# Patient Record
Sex: Female | Born: 1950 | Race: White | Hispanic: No | Marital: Married | Smoking: Current every day smoker
Health system: Southern US, Community
[De-identification: ages and names within clinical notes are randomized; demographics above are authoritative.]

## PROBLEM LIST (undated history)

## (undated) DIAGNOSIS — Z720 Tobacco use: Secondary | ICD-10-CM

## (undated) DIAGNOSIS — C3490 Malignant neoplasm of unspecified part of unspecified bronchus or lung: Secondary | ICD-10-CM

## (undated) DIAGNOSIS — M62838 Other muscle spasm: Secondary | ICD-10-CM

## (undated) HISTORY — PX: APPENDECTOMY: SHX54

## (undated) SURGERY — Surgical Case
Anesthesia: *Unknown

---

## 2003-11-24 ENCOUNTER — Other Ambulatory Visit: Payer: Self-pay

## 2004-01-12 ENCOUNTER — Emergency Department: Payer: Self-pay | Admitting: Internal Medicine

## 2004-12-21 ENCOUNTER — Emergency Department: Payer: Self-pay | Admitting: Emergency Medicine

## 2005-04-14 ENCOUNTER — Emergency Department: Payer: Self-pay | Admitting: Internal Medicine

## 2005-04-14 ENCOUNTER — Emergency Department: Payer: Self-pay | Admitting: Emergency Medicine

## 2005-04-26 ENCOUNTER — Emergency Department: Payer: Self-pay | Admitting: Emergency Medicine

## 2006-06-25 ENCOUNTER — Emergency Department: Payer: Self-pay | Admitting: Emergency Medicine

## 2006-09-06 ENCOUNTER — Other Ambulatory Visit: Payer: Self-pay

## 2006-09-06 ENCOUNTER — Inpatient Hospital Stay: Payer: Self-pay | Admitting: Internal Medicine

## 2006-12-04 ENCOUNTER — Emergency Department: Payer: Self-pay | Admitting: Emergency Medicine

## 2006-12-11 ENCOUNTER — Emergency Department: Payer: Self-pay | Admitting: Unknown Physician Specialty

## 2007-03-28 ENCOUNTER — Emergency Department: Payer: Self-pay | Admitting: Emergency Medicine

## 2007-04-26 ENCOUNTER — Emergency Department: Payer: Self-pay

## 2007-05-30 ENCOUNTER — Emergency Department: Payer: Self-pay | Admitting: Emergency Medicine

## 2007-08-14 ENCOUNTER — Emergency Department: Payer: Self-pay | Admitting: Emergency Medicine

## 2007-08-14 ENCOUNTER — Other Ambulatory Visit: Payer: Self-pay

## 2008-06-04 ENCOUNTER — Inpatient Hospital Stay: Payer: Self-pay | Admitting: Psychiatry

## 2008-07-21 ENCOUNTER — Emergency Department: Payer: Self-pay | Admitting: Emergency Medicine

## 2009-02-19 ENCOUNTER — Emergency Department: Payer: Self-pay | Admitting: Emergency Medicine

## 2009-04-26 ENCOUNTER — Emergency Department: Payer: Self-pay | Admitting: Emergency Medicine

## 2009-07-12 ENCOUNTER — Emergency Department: Payer: Self-pay | Admitting: Emergency Medicine

## 2009-10-27 ENCOUNTER — Emergency Department: Payer: Self-pay | Admitting: Emergency Medicine

## 2009-12-22 ENCOUNTER — Emergency Department: Payer: Self-pay | Admitting: Emergency Medicine

## 2010-01-18 ENCOUNTER — Emergency Department: Payer: Self-pay | Admitting: Emergency Medicine

## 2010-03-09 ENCOUNTER — Emergency Department: Payer: Self-pay | Admitting: Emergency Medicine

## 2010-03-25 ENCOUNTER — Emergency Department: Payer: Self-pay | Admitting: Emergency Medicine

## 2010-04-01 ENCOUNTER — Emergency Department: Payer: Self-pay | Admitting: Unknown Physician Specialty

## 2010-06-20 ENCOUNTER — Emergency Department: Payer: Self-pay | Admitting: Emergency Medicine

## 2010-07-31 ENCOUNTER — Emergency Department: Payer: Self-pay | Admitting: Emergency Medicine

## 2010-11-04 ENCOUNTER — Emergency Department: Payer: Self-pay | Admitting: Emergency Medicine

## 2011-01-28 ENCOUNTER — Inpatient Hospital Stay: Payer: Self-pay | Admitting: Psychiatry

## 2012-04-10 ENCOUNTER — Emergency Department: Payer: Self-pay | Admitting: Emergency Medicine

## 2012-04-10 LAB — URINALYSIS, COMPLETE
Bilirubin,UR: NEGATIVE
Glucose,UR: NEGATIVE mg/dL (ref 0–75)
Ketone: NEGATIVE
Nitrite: NEGATIVE
Specific Gravity: 1.009 (ref 1.003–1.030)
WBC UR: 2 /HPF (ref 0–5)

## 2012-04-10 LAB — BASIC METABOLIC PANEL
Creatinine: 0.89 mg/dL (ref 0.60–1.30)
EGFR (Non-African Amer.): >60
Osmolality: 275 (ref 275–301)

## 2012-04-10 LAB — CBC
HGB: 16 g/dL (ref 12.0–16.0)
MCH: 29.2 pg (ref 26.0–34.0)
MCV: 85 fL (ref 80–100)
Platelet: 222 10*3/uL (ref 150–440)
RBC: 5.47 10*6/uL — ABNORMAL HIGH (ref 3.80–5.20)
RDW: 14.8 % — ABNORMAL HIGH (ref 11.5–14.5)
WBC: 9 10*3/uL (ref 3.6–11.0)

## 2015-02-04 ENCOUNTER — Encounter: Payer: Self-pay | Admitting: Emergency Medicine

## 2015-02-04 ENCOUNTER — Emergency Department: Payer: Self-pay

## 2015-02-04 ENCOUNTER — Emergency Department
Admission: EM | Admit: 2015-02-04 | Discharge: 2015-02-04 | Disposition: A | Discharge status: Home / Self Care | Payer: Self-pay | Attending: Emergency Medicine | Admitting: Emergency Medicine

## 2015-02-04 DIAGNOSIS — R110 Nausea: Secondary | ICD-10-CM | POA: Insufficient documentation

## 2015-02-04 DIAGNOSIS — F17210 Nicotine dependence, cigarettes, uncomplicated: Secondary | ICD-10-CM | POA: Insufficient documentation

## 2015-02-04 DIAGNOSIS — M79602 Pain in left arm: Secondary | ICD-10-CM | POA: Insufficient documentation

## 2015-02-04 DIAGNOSIS — R0982 Postnasal drip: Secondary | ICD-10-CM | POA: Insufficient documentation

## 2015-02-04 DIAGNOSIS — N390 Urinary tract infection, site not specified: Secondary | ICD-10-CM | POA: Insufficient documentation

## 2015-02-04 DIAGNOSIS — M5412 Radiculopathy, cervical region: Secondary | ICD-10-CM | POA: Insufficient documentation

## 2015-02-04 LAB — CBC WITH DIFFERENTIAL/PLATELET
Basophils Absolute: 0.1 10*3/uL (ref 0–0.1)
Basophils Relative: 1 %
Eosinophils Absolute: 0.1 10*3/uL (ref 0–0.7)
Eosinophils Relative: 2 %
HCT: 40.7 % (ref 35.0–47.0)
HEMOGLOBIN: 13.4 g/dL (ref 12.0–16.0)
LYMPHS ABS: 1 10*3/uL (ref 1.0–3.6)
LYMPHS PCT: 15 %
MCH: 27 pg (ref 26.0–34.0)
MCHC: 33.1 g/dL (ref 32.0–36.0)
MCV: 81.7 fL (ref 80.0–100.0)
Monocytes Absolute: 0.4 10*3/uL (ref 0.2–0.9)
Monocytes Relative: 6 %
NEUTROS ABS: 5.6 10*3/uL (ref 1.4–6.5)
NEUTROS PCT: 78 %
Platelets: 157 10*3/uL (ref 150–440)
RBC: 4.97 MIL/uL (ref 3.80–5.20)
RDW: 16.1 % — ABNORMAL HIGH (ref 11.5–14.5)
WBC: 7.2 10*3/uL (ref 3.6–11.0)

## 2015-02-04 LAB — URINALYSIS COMPLETE WITH MICROSCOPIC (ARMC ONLY)
Bilirubin Urine: NEGATIVE
GLUCOSE, UA: NEGATIVE mg/dL
Hgb urine dipstick: NEGATIVE
Ketones, ur: NEGATIVE mg/dL
Nitrite: NEGATIVE
Protein, ur: NEGATIVE mg/dL
Specific Gravity, Urine: 1.01 (ref 1.005–1.030)
pH: 5 (ref 5.0–8.0)

## 2015-02-04 LAB — COMPREHENSIVE METABOLIC PANEL
ALK PHOS: 140 U/L — AB (ref 38–126)
ALT: 27 U/L (ref 14–54)
AST: 41 U/L (ref 15–41)
Albumin: 3.9 g/dL (ref 3.5–5.0)
Anion gap: 8 (ref 5–15)
BUN: 11 mg/dL (ref 6–20)
CALCIUM: 10.4 mg/dL — AB (ref 8.9–10.3)
CO2: 28 mmol/L (ref 22–32)
CREATININE: 0.87 mg/dL (ref 0.44–1.00)
Chloride: 107 mmol/L (ref 101–111)
GFR calc non Af Amer: >60 mL/min (ref >60)
GLUCOSE: 106 mg/dL — AB (ref 65–99)
Potassium: 3.7 mmol/L (ref 3.5–5.1)
SODIUM: 143 mmol/L (ref 135–145)
Total Bilirubin: 0.6 mg/dL (ref 0.3–1.2)
Total Protein: 7.2 g/dL (ref 6.5–8.1)

## 2015-02-04 MED ORDER — PREDNISONE 20 MG PO TABS
60.0000 mg | ORAL_TABLET | Freq: Once | ORAL | Status: AC
  Administered 2015-02-04: 60 mg via ORAL
  Filled 2015-02-04: qty 3

## 2015-02-04 MED ORDER — PREDNISONE 10 MG (21) PO TBPK: ORAL_TABLET | ORAL | Status: DC

## 2015-02-04 MED ORDER — HYDROCODONE-ACETAMINOPHEN 5-325 MG PO TABS
1.0000 | ORAL_TABLET | Freq: Once | ORAL | Status: AC
  Administered 2015-02-04: 1 via ORAL
  Filled 2015-02-04: qty 1

## 2015-02-04 MED ORDER — HYDROCODONE-ACETAMINOPHEN 5-325 MG PO TABS: 1.0000 | ORAL_TABLET | ORAL | Status: DC | PRN

## 2015-02-04 MED ORDER — CIPROFLOXACIN HCL 500 MG PO TABS: 500.0000 mg | ORAL_TABLET | Freq: Two times a day (BID) | ORAL | Status: AC

## 2015-02-04 MED ORDER — CIPROFLOXACIN HCL 500 MG PO TABS
500.0000 mg | ORAL_TABLET | Freq: Once | ORAL | Status: AC
  Administered 2015-02-04: 500 mg via ORAL
  Filled 2015-02-04: qty 1

## 2015-02-04 NOTE — ED Notes (Signed)
States she has had back pain for about 2 months  Now having some numbness to left arm for the past 2 weeks . Denies injury. Trying OTC ibuprofen with min to no relief

## 2015-02-04 NOTE — ED Notes (Signed)
C/o mid back pain that radiates down both legs x 2 months, states pain is getting worse, states she also has had some "nerve pain" to left forearm that she is had about 2 weeks, states she had a previous fx and surgery to left arm in the past

## 2015-02-04 NOTE — Discharge Instructions (Signed)
Cervical Radiculopathy Cervical radiculopathy happens when a nerve in the neck (cervical nerve) is pinched or bruised. This condition can develop because of an injury or as part of the normal aging process. Pressure on the cervical nerves can cause pain or numbness that runs from the neck all the way down into the arm and fingers. Usually, this condition gets better with rest. Treatment may be needed if the condition does not improve.  CAUSES This condition may be caused by:  Injury.  Slipped (herniated) disk.  Muscle tightness in the neck because of overuse.  Arthritis.  Breakdown or degeneration in the bones and joints of the spine (spondylosis) due to aging.  Bone spurs that may develop near the cervical nerves. SYMPTOMS Symptoms of this condition include:  Pain that runs from the neck to the arm and hand. The pain can be severe or irritating. It may be worse when the neck is moved.  Numbness or weakness in the affected arm and hand. DIAGNOSIS This condition may be diagnosed based on symptoms, medical history, and a physical exam. You may also have tests, including:  X-rays.  CT scan.  MRI.  Electromyogram (EMG).  Nerve conduction tests. TREATMENT In many cases, treatment is not needed for this condition. With rest, the condition usually gets better over time. If treatment is needed, options may include:  Wearing a soft neck collar for short periods of time.  Physical therapy to strengthen your neck muscles.  Medicines, such as NSAIDs, oral corticosteroids, or spinal injections.  Surgery. This may be needed if other treatments do not help. Various types of surgery may be done depending on the cause of your problems. HOME CARE INSTRUCTIONS Managing Pain  Take over-the-counter and prescription medicines only as told by your health care provider.  If directed, apply ice to the affected area.  Put ice in a plastic bag.  Place a towel between your skin and the  bag.  Leave the ice on for 20 minutes, 2-3 times per day.  If ice does not help, you can try using heat. Take a warm shower or warm bath, or use a heat pack as told by your health care provider.  Try a gentle neck and shoulder massage to help relieve symptoms. Activity  Rest as needed. Follow instructions from your health care provider about any restrictions on activities.  Do stretching and strengthening exercises as told by your health care provider or physical therapist. General Instructions  If you were given a soft collar, wear it as told by your health care provider.  Use a flat pillow when you sleep.  Keep all follow-up visits as told by your health care provider. This is important. SEEK MEDICAL CARE IF:  Your condition does not improve with treatment. SEEK IMMEDIATE MEDICAL CARE IF:  Your pain gets much worse and cannot be controlled with medicines.  You have weakness or numbness in your hand, arm, face, or leg.  You have a high fever.  You have a stiff, rigid neck.  You lose control of your bowels or your bladder (have incontinence).  You have trouble with walking, balance, or speaking.   This information is not intended to replace advice given to you by your health care provider. Make sure you discuss any questions you have with your health care provider.   Document Released: 11/29/2000 Document Revised: 11/25/2014 Document Reviewed: 04/30/2014 Elsevier Interactive Patient Education 2016 Elsevier Inc[employer]   Urinary Tract Infection Urinary tract infections (UTIs) can develop anywhere along your  urinary tract. Your urinary tract is your body's drainage system for removing wastes and extra water. Your urinary tract includes two kidneys, two ureters, a bladder, and a urethra. Your kidneys are a pair of bean-shaped organs. Each kidney is about the size of your fist. They are located below your ribs, one on each side of your spine. CAUSES Infections are caused by  microbes, which are microscopic organisms, including fungi, viruses, and bacteria. These organisms are so small that they can only be seen through a microscope. Bacteria are the microbes that most commonly cause UTIs. SYMPTOMS  Symptoms of UTIs may vary by age and gender of the patient and by the location of the infection. Symptoms in young women typically include a frequent and intense urge to urinate and a painful, burning feeling in the bladder or urethra during urination. Older women and men are more likely to be tired, shaky, and weak and have muscle aches and abdominal pain. A fever may mean the infection is in your kidneys. Other symptoms of a kidney infection include pain in your back or sides below the ribs, nausea, and vomiting. DIAGNOSIS To diagnose a UTI, your caregiver will ask you about your symptoms. Your caregiver will also ask you to provide a urine sample. The urine sample will be tested for bacteria and white blood cells. White blood cells are made by your body to help fight infection. TREATMENT  Typically, UTIs can be treated with medication. Because most UTIs are caused by a bacterial infection, they usually can be treated with the use of antibiotics. The choice of antibiotic and length of treatment depend on your symptoms and the type of bacteria causing your infection. HOME CARE INSTRUCTIONS  If you were prescribed antibiotics, take them exactly as your caregiver instructs you. Finish the medication even if you feel better after you have only taken some of the medication.  Drink enough water and fluids to keep your urine clear or pale yellow.  Avoid caffeine, tea, and carbonated beverages. They tend to irritate your bladder.  Empty your bladder often. Avoid holding urine for long periods of time.  Empty your bladder before and after sexual intercourse.  After a bowel movement, women should cleanse from front to back. Use each tissue only once. SEEK MEDICAL CARE IF:   You  have back pain.  You develop a fever.  Your symptoms do not begin to resolve within 3 days. SEEK IMMEDIATE MEDICAL CARE IF:   You have severe back pain or lower abdominal pain.  You develop chills.  You have nausea or vomiting.  You have continued burning or discomfort with urination. MAKE SURE YOU:   Understand these instructions.  Will watch your condition.  Will get help right away if you are not doing well or get worse.   This information is not intended to replace advice given to you by your health care provider. Make sure you discuss any questions you have with your health care provider.   Document Released: 12/14/2004 Document Revised: 11/25/2014 Document Reviewed: 04/14/2011 Elsevier Interactive Patient Education 2016 Elsevier Inc.   Take pain medicine and prednisone taper as directed. Follow-up with the orthopedist if not improving. Please establish with a primary care physician for further evaluation as well. Also take antibiotic as directed and return to emergency room for any concerns.

## 2015-02-04 NOTE — ED Provider Notes (Signed)
[facility] Emergency Department Provider Note  ____________________________________________  Time seen: Approximately 4:21 PM  I have reviewed the triage vital signs and the nursing notes.   HISTORY  Chief Complaint Back Pain    HPI Brandy Odom is a 64 y.o. female who presents to the emergency room with several concerns. She does not have a primary physician. She has developed left arm pain 2 weeks. She describes pins and needles sensation. She was seen in a walk-in clinic approximately 2 weeks ago for low back pain and received a lumbar x-ray. She also complains of nausea, urinary frequency and occasional dysuria. She has had a URI that is improving. No current fevers. Minimal sinus drainage. She received NSAIDs for her back pain.   History reviewed. No pertinent past medical history.  There are no active problems to display for this patient.   Past Surgical History  Procedure Laterality Date  . Appendectomy      Current Outpatient Rx  Name  Route  Sig  Dispense  Refill  . ciprofloxacin (CIPRO) 500 MG tablet   Oral   Take 1 tablet (500 mg total) by mouth 2 (two) times daily.   10 tablet   0   . HYDROcodone-acetaminophen (NORCO) 5-325 MG tablet   Oral   Take 1 tablet by mouth every 4 (four) hours as needed for moderate pain.   20 tablet   0   . predniSONE (STERAPRED UNI-PAK 21 TAB) 10 MG (21) TBPK tablet      6 tablets on day 1, 5 tablets on day 2, 4 tablets on day 3, etc...   21 tablet   0     Allergies Naproxen  No family history on file.  Social History Social History  Substance Use Topics  . Smoking status: Current Every Day Smoker    Types: Cigarettes  . Smokeless tobacco: None  . Alcohol Use: No    Review of Systems Constitutional: No fever/chills Eyes: No visual changes. ENT: No sore throat. Cardiovascular: Denies chest pain. Respiratory: Denies shortness of breath. Gastrointestinal: No abdominal pain.    Genitourinary: Negative for dysuria. Musculoskeletal: Per history of present illness Skin: Negative for rash. Neurological: Negative for headaches, focal weakness or numbness. 10-point ROS otherwise negative.  ____________________________________________   PHYSICAL EXAM:  VITAL SIGNS: ED Triage Vitals  Enc Vitals Group     BP 02/04/15 1545 144/88 mmHg     Pulse Rate 02/04/15 1545 102     Resp 02/04/15 1545 18     Temp 02/04/15 1545 98.2 F (36.8 C)     Temp Source 02/04/15 1545 Oral     SpO2 02/04/15 1545 96 %     Weight 02/04/15 1545 145 lb (65.772 kg)     Height 02/04/15 1545 5' 4"  (1.626 m)     Head Cir --      Peak Flow --      Pain Score 02/04/15 1546 10     Pain Loc --      Pain Edu? --      Excl. in [place]? --     Constitutional: Alert and oriented. Well appearing and in no acute distress. Eyes: Conjunctivae are normal. PERRL. EOMI. Ears:  Clear with normal landmarks. No erythema. Head: Atraumatic. Nose: No congestion/rhinnorhea. Mouth/Throat: Mucous membranes are moist.  Oropharynx non-erythematous. No lesions. Neck:  Supple.  No adenopathy.  Cervical tenderness, with left paracervical tenderness. Cardiovascular: Normal rate, regular rhythm. Grossly normal heart sounds.  Good peripheral circulation.  Respiratory: Normal respiratory effort.  No retractions. Lungs CTAB. Musculoskeletal: Nml ROM of upper and lower extremity joints. Negative supraspinatus test. Negative impingement sign Neurologic:  Normal speech and language. No gross focal neurologic deficits are appreciated. No gait instability. Skin:  Skin is warm, dry and intact. No rash noted. Psychiatric: Mood and affect are normal. Speech and behavior are normal.  ____________________________________________   LABS (all labs ordered are listed, but only abnormal results are displayed)  Labs Reviewed  URINALYSIS COMPLETEWITH MICROSCOPIC ([facility] ONLY) - Abnormal; Notable for the following:    Color, Urine  YELLOW (*)    APPearance HAZY (*)    Leukocytes, UA 1+ (*)    Bacteria, UA MANY (*)    Squamous Epithelial / LPF 6-30 (*)    All other components within normal limits  CBC WITH DIFFERENTIAL/PLATELET - Abnormal; Notable for the following:    RDW 16.1 (*)    All other components within normal limits  COMPREHENSIVE METABOLIC PANEL - Abnormal; Notable for the following:    Glucose, Bld 106 (*)    Calcium 10.4 (*)    Alkaline Phosphatase 140 (*)    All other components within normal limits   ____________________________________________  EKG   ____________________________________________  RADIOLOGY  CLINICAL DATA: Neck pain, left arm pain and numbness for about 2 weeks  EXAM: CERVICAL SPINE - 2-3 VIEW  COMPARISON: None.  FINDINGS: Four views of cervical spine submitted. No acute fracture or subluxation. Mild degenerative changes C1-C2 articulation. There is disc space flattening with mild anterior spurring at C5-C6 and C6-C7 level. No prevertebral soft tissue swelling. Cervical airway is patent.  IMPRESSION: No acute fracture or subluxation. Degenerative changes as described above.   Electronically Signed  By: Lahoma Crocker M.D.  On: 02/04/2015 16:54 ____________________________________________   PROCEDURES  Procedure(s) performed: None  Critical Care performed: No  ____________________________________________   INITIAL IMPRESSION / ASSESSMENT AND PLAN / ED COURSE  Pertinent labs & imaging results that were available during my care of the patient were reviewed by me and considered in my medical decision making (see chart for details).  64 year old who presents with left arm pain, pins and needle sensation. Treated for cervical radiculopathy with prednisone taper, and hydrocodone. Cervical spine films reveal degenerative disease. Encouraged follow-up with orthopedist and establishing with a primary care physician. CBC and met C are stable. Her urine  test came back positive for UTI. She is started on Cipro for 5 days. ____________________________________________   FINAL CLINICAL IMPRESSION(S) / ED DIAGNOSES  Final diagnoses:  Cervical radiculopathy  UTI (lower urinary tract infection)      Mortimer Fries, PA-C 02/04/15 [place], MD 02/05/15 0110

## 2015-02-09 ENCOUNTER — Emergency Department: Payer: Medicaid Other

## 2015-02-09 ENCOUNTER — Emergency Department
Admission: EM | Admit: 2015-02-09 | Discharge: 2015-02-09 | Disposition: A | Discharge status: Home / Self Care | Payer: Medicaid Other | Attending: Emergency Medicine | Admitting: Emergency Medicine

## 2015-02-09 DIAGNOSIS — M549 Dorsalgia, unspecified: Secondary | ICD-10-CM | POA: Diagnosis not present

## 2015-02-09 DIAGNOSIS — J159 Unspecified bacterial pneumonia: Secondary | ICD-10-CM | POA: Insufficient documentation

## 2015-02-09 DIAGNOSIS — J189 Pneumonia, unspecified organism: Secondary | ICD-10-CM

## 2015-02-09 DIAGNOSIS — F17210 Nicotine dependence, cigarettes, uncomplicated: Secondary | ICD-10-CM | POA: Insufficient documentation

## 2015-02-09 DIAGNOSIS — R079 Chest pain, unspecified: Secondary | ICD-10-CM | POA: Diagnosis present

## 2015-02-09 LAB — COMPREHENSIVE METABOLIC PANEL
ALT: 95 U/L — ABNORMAL HIGH (ref 14–54)
AST: 69 U/L — AB (ref 15–41)
Albumin: 4 g/dL (ref 3.5–5.0)
Alkaline Phosphatase: 136 U/L — ABNORMAL HIGH (ref 38–126)
Anion gap: 9 (ref 5–15)
BILIRUBIN TOTAL: 0.8 mg/dL (ref 0.3–1.2)
BUN: 19 mg/dL (ref 6–20)
CALCIUM: 10.1 mg/dL (ref 8.9–10.3)
CO2: 25 mmol/L (ref 22–32)
CREATININE: 0.92 mg/dL (ref 0.44–1.00)
Chloride: 107 mmol/L (ref 101–111)
GFR calc Af Amer: >60 mL/min (ref >60)
Glucose, Bld: 118 mg/dL — ABNORMAL HIGH (ref 65–99)
POTASSIUM: 3.3 mmol/L — AB (ref 3.5–5.1)
Sodium: 141 mmol/L (ref 135–145)
TOTAL PROTEIN: 7.1 g/dL (ref 6.5–8.1)

## 2015-02-09 LAB — CBC
HEMATOCRIT: 42.2 % (ref 35.0–47.0)
Hemoglobin: 14.4 g/dL (ref 12.0–16.0)
MCH: 27.6 pg (ref 26.0–34.0)
MCHC: 34.1 g/dL (ref 32.0–36.0)
MCV: 80.9 fL (ref 80.0–100.0)
Platelets: 166 10*3/uL (ref 150–440)
RBC: 5.22 MIL/uL — ABNORMAL HIGH (ref 3.80–5.20)
RDW: 17.2 % — AB (ref 11.5–14.5)
WBC: 9 10*3/uL (ref 3.6–11.0)

## 2015-02-09 LAB — TROPONIN I

## 2015-02-09 MED ORDER — DIAZEPAM 5 MG PO TABS
5.0000 mg | ORAL_TABLET | Freq: Three times a day (TID) | ORAL | Status: DC | PRN
Start: 2015-02-09 — End: 2015-02-22

## 2015-02-09 MED ORDER — SODIUM CHLORIDE 0.9 % IV BOLUS (SEPSIS)
1000.0000 mL | Freq: Once | INTRAVENOUS | Status: AC
  Administered 2015-02-09: 1000 mL via INTRAVENOUS

## 2015-02-09 MED ORDER — AZITHROMYCIN 250 MG PO TABS: ORAL_TABLET | ORAL | Status: DC

## 2015-02-09 MED ORDER — DIAZEPAM 5 MG PO TABS
5.0000 mg | ORAL_TABLET | Freq: Once | ORAL | Status: AC
  Administered 2015-02-09: 5 mg via ORAL
  Filled 2015-02-09: qty 1

## 2015-02-09 MED ORDER — AZITHROMYCIN 250 MG PO TABS
500.0000 mg | ORAL_TABLET | Freq: Once | ORAL | Status: AC
  Administered 2015-02-09: 500 mg via ORAL
  Filled 2015-02-09: qty 2

## 2015-02-09 MED ORDER — TRAMADOL HCL 50 MG PO TABS: 50.0000 mg | ORAL_TABLET | Freq: Four times a day (QID) | ORAL | Status: DC | PRN

## 2015-02-09 MED ORDER — KETOROLAC TROMETHAMINE 30 MG/ML IJ SOLN
30.0000 mg | Freq: Once | INTRAMUSCULAR | Status: AC
  Administered 2015-02-09: 30 mg via INTRAVENOUS
  Filled 2015-02-09: qty 1

## 2015-02-09 NOTE — ED Notes (Signed)
Pt in with co left arm pain states was told it is a pinched nerve.  Has had same pain for months but today radiates to right arm.

## 2015-02-09 NOTE — ED Provider Notes (Signed)
[facility] Emergency Department Provider Note  ____________________________________________  Time seen: Approximately 530 AM  I have reviewed the triage vital signs and the nursing notes.   HISTORY  Chief Complaint Chest Pain    HPI Brandy Odom is a 64 y.o. female who comes into the hospital today with back pain and muscle spasm in her chest. The patient reports she was here 3 days ago and was told that she had a UTI and arthritis in her neck. The patient was given prednisone and Cipro for her UTI. She reports that she thinks she needs her symptoms have worsened. The patient reports that she's had pins and needles in her left arm and on Sunday developed pain running from her left chest in her right chest. The patient reports that it felt like a spasm and she couldn't move her upper body all day. The patient has been taking ibuprofen as well as hydrocodone but she reports it helps but she wants to know why she's hurting. The patient was told to make an appointment but does not have one until the 30th of the month. She reports that she can't do anything except for get up and go to the bathroom and drink water. She reports that her symptoms are getting worse daily. The patient rates her pain as a 9 out of 10 in intensity and she is unable to pinpoint where the pain is that she has it in her back and her chest. The patient does not have any shortness of breath blurry vision nausea vomiting or dizziness.   No past medical history  There are no active problems to display for this patient.   Past Surgical History  Procedure Laterality Date  . Appendectomy      Current Outpatient Rx  Name  Route  Sig  Dispense  Refill  . HYDROcodone-acetaminophen (NORCO) 5-325 MG tablet   Oral   Take 1 tablet by mouth every 4 (four) hours as needed for moderate pain.   20 tablet   0   . predniSONE (STERAPRED UNI-PAK 21 TAB) 10 MG (21) TBPK tablet      6 tablets on day 1, 5  tablets on day 2, 4 tablets on day 3, etc...   21 tablet   0     Allergies Bee pollen; Naproxen; Sulfa antibiotics; and Antihistamines, chlorpheniramine-type  No family history on file.  Social History Social History  Substance Use Topics  . Smoking status: Current Every Day Smoker    Types: Cigarettes  . Smokeless tobacco: Not on file  . Alcohol Use: No    Review of Systems Constitutional: No fever/chills Eyes: No visual changes. ENT: No sore throat. Cardiovascular:  chest pain. Respiratory: Cough with no shortness of breath. Gastrointestinal: No abdominal pain.  No nausea, no vomiting.  No diarrhea.  No constipation. Genitourinary: Negative for dysuria. Musculoskeletal: Neck and back pain. Skin: Negative for rash. Neurological: Negative for headaches, focal weakness or numbness.  10-point ROS otherwise negative.  ____________________________________________   PHYSICAL EXAM:  VITAL SIGNS: ED Triage Vitals  Enc Vitals Group     BP 02/09/15 0502 131/88 mmHg     Pulse Rate 02/09/15 0502 108     Resp 02/09/15 0502 20     Temp 02/09/15 0502 98.4 F (36.9 C)     Temp Source 02/09/15 0502 Oral     SpO2 02/09/15 0502 92 %     Weight 02/09/15 0502 150 lb (68.04 kg)     Height  02/09/15 0502 '5\' 4"'$  (1.626 m)     Head Cir --      Peak Flow --      Pain Score 02/09/15 0503 9     Pain Loc --      Pain Edu? --      Excl. in [place]? --     Constitutional: Alert and oriented. Well appearing and in moderate distress. Eyes: Conjunctivae are normal. PERRL. EOMI. Head: Atraumatic. Nose: No congestion/rhinnorhea. Mouth/Throat: Mucous membranes are moist.  Oropharynx non-erythematous. Cardiovascular: Normal rate, regular rhythm. Grossly normal heart sounds.  Good peripheral circulation. Chest tender to palpation Respiratory: Normal respiratory effort.  No retractions. Rhonchi in all lung fields right greater than left Gastrointestinal: Soft and nontender. No distention.  Positive bowel sounds Musculoskeletal: No lower extremity tenderness nor edema.  Neurologic:  Normal speech and language. No gross focal neurologic deficits are appreciated.  Skin:  Skin is warm, dry and intact.  Psychiatric: Mood and affect are normal.   ____________________________________________   LABS (all labs ordered are listed, but only abnormal results are displayed)  Labs Reviewed  CBC  COMPREHENSIVE METABOLIC PANEL  TROPONIN I   ____________________________________________  EKG  ED ECG REPORT I, Loney Hering, the attending physician, personally viewed and interpreted this ECG.   Date: 02/09/2015  EKG Time: 509  Rate: 105  Rhythm: sinus tachycardia  Axis: normal  Intervals:none  ST&T Change: none  ____________________________________________  RADIOLOGY  CXR: Minimal nodular opacities in the periphery of the right lung raise question for a mild infectious process ____________________________________________   PROCEDURES  Procedure(s) performed: None  Critical Care performed: No  ____________________________________________   INITIAL IMPRESSION / ASSESSMENT AND PLAN / ED COURSE  Pertinent labs & imaging results that were available during my care of the patient were reviewed by me and considered in my medical decision making (see chart for details).  This is a 64 year old female who comes in with some right-sided chest pain on palpation chronic back pain and some arm pain and tingling. The patient's chest x-ray does look like it may have a pneumonia but I will assess the patient with some blood work and reassess her symptoms once I received her blood work results. She'll receive a dose of Valium and Toradol for her pain.  The patient's care will be signed out to Dr. Corky Downs to evaluate the labs and reassess the patient. ____________________________________________   FINAL CLINICAL IMPRESSION(S) / ED DIAGNOSES  Final diagnoses:  None       Loney Hering, MD 02/09/15 [phone]

## 2015-02-09 NOTE — Discharge Instructions (Signed)
Chest Wall Pain Chest wall pain is pain in or around the bones and muscles of your chest. Sometimes, an injury causes this pain. Sometimes, the cause may not be known. This pain may take several weeks or longer to get better. HOME CARE INSTRUCTIONS  Pay attention to any changes in your symptoms. Take these actions to help with your pain:   Rest as told by your health care provider.   Avoid activities that cause pain. These include any activities that use your chest muscles or your abdominal and side muscles to lift heavy items.   If directed, apply ice to the painful area:  Put ice in a plastic bag.  Place a towel between your skin and the bag.  Leave the ice on for 20 minutes, 2-3 times per day.  Take over-the-counter and prescription medicines only as told by your health care provider.  Do not use tobacco products, including cigarettes, chewing tobacco, and e-cigarettes. If you need help quitting, ask your health care provider.  Keep all follow-up visits as told by your health care provider. This is important. SEEK MEDICAL CARE IF:  You have a fever.  Your chest pain becomes worse.  You have new symptoms. SEEK IMMEDIATE MEDICAL CARE IF:  You have nausea or vomiting.  You feel sweaty or light-headed.  You have a cough with phlegm (sputum) or you cough up blood.  You develop shortness of breath.   This information is not intended to replace advice given to you by your health care provider. Make sure you discuss any questions you have with your health care provider.   Document Released: 03/06/2005 Document Revised: 11/25/2014 Document Reviewed: 06/01/2014 Elsevier Interactive Patient Education 2016 [place] Pneumonia, Adult Pneumonia is an infection of the lungs. One type of pneumonia can happen while a person is in a hospital. A different type can happen when a person is not in a hospital (community-acquired pneumonia). It is easy for this  kind to spread from person to person. It can spread to you if you breathe near an infected person who coughs or sneezes. Some symptoms include:  A dry cough.  A wet (productive) cough.  Fever.  Sweating.  Chest pain. HOME CARE  Take over-the-counter and prescription medicines only as told by your doctor.  Only take cough medicine if you are losing sleep.  If you were prescribed an antibiotic medicine, take it as told by your doctor. Do not stop taking the antibiotic even if you start to feel better.  Sleep with your head and neck raised (elevated). You can do this by putting a few pillows under your head, or you can sleep in a recliner.  Do not use tobacco products. These include cigarettes, chewing tobacco, and e-cigarettes. If you need help quitting, ask your doctor.  Drink enough water to keep your pee (urine) clear or pale yellow. A shot (vaccine) can help prevent pneumonia. Shots are often suggested for:  People older than 64 years of age.  People older than 64 years of age:  Who are having cancer treatment.  Who have long-term (chronic) lung disease.  Who have problems with their body's defense system (immune system). You may also prevent pneumonia if you take these actions:  Get the flu (influenza) shot every year.  Go to the dentist as often as told.  Wash your hands often. If soap and water are not available, use hand sanitizer. GET HELP IF:  You have a fever.  You lose sleep  because your cough medicine does not help. GET HELP RIGHT AWAY IF:  You are short of breath and it gets worse.  You have more chest pain.  Your sickness gets worse. This is very serious if:  You are an older adult.  Your body's defense system is weak.  You cough up blood.   This information is not intended to replace advice given to you by your health care provider. Make sure you discuss any questions you have with your health care provider.   Document Released: 08/23/2007  Document Revised: 11/25/2014 Document Reviewed: 07/01/2014 Elsevier Interactive Patient Education [employer].

## 2015-02-09 NOTE — ED Provider Notes (Signed)
The patient's blood work results returns prior to my sign out to Dr. Corky Downs. The patient does have some mild transaminitis but she has had some in the past as well. The patient has no RUQ pain at this time or vomiting. The patient's troponin and CBC are unremarkable and the patient has had this pain since Sunday. She will be discharged home to [facility].   Loney Hering, MD 02/09/15 [phone]

## 2015-02-09 NOTE — ED Notes (Addendum)
Pt uprite on stretcher in exam room with no distress noted; pt reports lower back pain and upper chest "spasms"; st seen here 3 days ago and dx with UTI and pinched nerve in neck, rx prednisone, cipro and hydrocodone but "feels worse"; st having persistent "pins and needle" sensation to left arm with pain to left axillae radiating across upper chest into right axillae; st unable to obtain f/u appt until 11/30 with [facility]; pt st these symptoms have been ongoing for 3 months; st was also seen at [facility]gent [facility] & rx naproxen without relief; pt with congested cough noted; resp even/unlab, rhales auscultated, chest tender to palpation; Dr Dahlia Client at bedside to exam pt; husband also at bedside

## 2015-02-09 NOTE — ED Notes (Signed)
Pt to xray via stretcher accomp by xray tech

## 2015-02-12 ENCOUNTER — Inpatient Hospital Stay
Admission: EM | Admit: 2015-02-12 | Discharge: 2015-02-19 | DRG: 987 | Disposition: A | Discharge status: Home Health | Payer: Medicaid Other | Attending: Internal Medicine | Admitting: Internal Medicine

## 2015-02-12 ENCOUNTER — Emergency Department: Payer: Medicaid Other

## 2015-02-12 ENCOUNTER — Encounter: Payer: Self-pay | Admitting: Emergency Medicine

## 2015-02-12 DIAGNOSIS — R591 Generalized enlarged lymph nodes: Secondary | ICD-10-CM

## 2015-02-12 DIAGNOSIS — H532 Diplopia: Secondary | ICD-10-CM | POA: Diagnosis present

## 2015-02-12 DIAGNOSIS — R911 Solitary pulmonary nodule: Secondary | ICD-10-CM | POA: Insufficient documentation

## 2015-02-12 DIAGNOSIS — J984 Other disorders of lung: Secondary | ICD-10-CM | POA: Diagnosis present

## 2015-02-12 DIAGNOSIS — J440 Chronic obstructive pulmonary disease with acute lower respiratory infection: Secondary | ICD-10-CM | POA: Diagnosis present

## 2015-02-12 DIAGNOSIS — N390 Urinary tract infection, site not specified: Secondary | ICD-10-CM | POA: Diagnosis present

## 2015-02-12 DIAGNOSIS — R948 Abnormal results of function studies of other organs and systems: Secondary | ICD-10-CM

## 2015-02-12 DIAGNOSIS — F17210 Nicotine dependence, cigarettes, uncomplicated: Secondary | ICD-10-CM | POA: Diagnosis present

## 2015-02-12 DIAGNOSIS — J209 Acute bronchitis, unspecified: Secondary | ICD-10-CM | POA: Diagnosis present

## 2015-02-12 DIAGNOSIS — J441 Chronic obstructive pulmonary disease with (acute) exacerbation: Secondary | ICD-10-CM | POA: Diagnosis present

## 2015-02-12 DIAGNOSIS — C7960 Secondary malignant neoplasm of unspecified ovary: Secondary | ICD-10-CM | POA: Diagnosis present

## 2015-02-12 DIAGNOSIS — M62838 Other muscle spasm: Secondary | ICD-10-CM | POA: Diagnosis present

## 2015-02-12 DIAGNOSIS — C799 Secondary malignant neoplasm of unspecified site: Secondary | ICD-10-CM | POA: Diagnosis present

## 2015-02-12 DIAGNOSIS — C7931 Secondary malignant neoplasm of brain: Secondary | ICD-10-CM | POA: Diagnosis present

## 2015-02-12 DIAGNOSIS — Z515 Encounter for palliative care: Secondary | ICD-10-CM | POA: Diagnosis present

## 2015-02-12 DIAGNOSIS — C787 Secondary malignant neoplasm of liver and intrahepatic bile duct: Secondary | ICD-10-CM | POA: Diagnosis present

## 2015-02-12 DIAGNOSIS — R51 Headache: Secondary | ICD-10-CM | POA: Diagnosis present

## 2015-02-12 DIAGNOSIS — R935 Abnormal findings on diagnostic imaging of other abdominal regions, including retroperitoneum: Secondary | ICD-10-CM | POA: Insufficient documentation

## 2015-02-12 DIAGNOSIS — H0589 Other disorders of orbit: Secondary | ICD-10-CM | POA: Diagnosis present

## 2015-02-12 DIAGNOSIS — F419 Anxiety disorder, unspecified: Secondary | ICD-10-CM | POA: Diagnosis present

## 2015-02-12 DIAGNOSIS — Z79891 Long term (current) use of opiate analgesic: Secondary | ICD-10-CM

## 2015-02-12 DIAGNOSIS — R110 Nausea: Secondary | ICD-10-CM | POA: Diagnosis present

## 2015-02-12 DIAGNOSIS — J9601 Acute respiratory failure with hypoxia: Secondary | ICD-10-CM | POA: Diagnosis present

## 2015-02-12 DIAGNOSIS — C180 Malignant neoplasm of cecum: Secondary | ICD-10-CM | POA: Diagnosis present

## 2015-02-12 DIAGNOSIS — C7800 Secondary malignant neoplasm of unspecified lung: Secondary | ICD-10-CM | POA: Diagnosis present

## 2015-02-12 DIAGNOSIS — R0902 Hypoxemia: Secondary | ICD-10-CM | POA: Diagnosis present

## 2015-02-12 DIAGNOSIS — Z79899 Other long term (current) drug therapy: Secondary | ICD-10-CM

## 2015-02-12 DIAGNOSIS — R590 Localized enlarged lymph nodes: Secondary | ICD-10-CM | POA: Diagnosis present

## 2015-02-12 DIAGNOSIS — Z720 Tobacco use: Secondary | ICD-10-CM | POA: Diagnosis present

## 2015-02-12 DIAGNOSIS — R0600 Dyspnea, unspecified: Secondary | ICD-10-CM | POA: Diagnosis present

## 2015-02-12 DIAGNOSIS — N83209 Unspecified ovarian cyst, unspecified side: Secondary | ICD-10-CM | POA: Diagnosis present

## 2015-02-12 DIAGNOSIS — Z91030 Bee allergy status: Secondary | ICD-10-CM

## 2015-02-12 DIAGNOSIS — B9562 Methicillin resistant Staphylococcus aureus infection as the cause of diseases classified elsewhere: Secondary | ICD-10-CM | POA: Diagnosis present

## 2015-02-12 DIAGNOSIS — C7951 Secondary malignant neoplasm of bone: Secondary | ICD-10-CM | POA: Diagnosis present

## 2015-02-12 DIAGNOSIS — C7970 Secondary malignant neoplasm of unspecified adrenal gland: Secondary | ICD-10-CM | POA: Diagnosis present

## 2015-02-12 DIAGNOSIS — R0602 Shortness of breath: Secondary | ICD-10-CM | POA: Diagnosis present

## 2015-02-12 DIAGNOSIS — R059 Cough, unspecified: Secondary | ICD-10-CM | POA: Diagnosis present

## 2015-02-12 DIAGNOSIS — C189 Malignant neoplasm of colon, unspecified: Secondary | ICD-10-CM | POA: Insufficient documentation

## 2015-02-12 DIAGNOSIS — Z882 Allergy status to sulfonamides status: Secondary | ICD-10-CM

## 2015-02-12 DIAGNOSIS — M899 Disorder of bone, unspecified: Secondary | ICD-10-CM | POA: Insufficient documentation

## 2015-02-12 DIAGNOSIS — E43 Unspecified severe protein-calorie malnutrition: Secondary | ICD-10-CM | POA: Diagnosis present

## 2015-02-12 DIAGNOSIS — R599 Enlarged lymph nodes, unspecified: Secondary | ICD-10-CM | POA: Insufficient documentation

## 2015-02-12 DIAGNOSIS — K5730 Diverticulosis of large intestine without perforation or abscess without bleeding: Secondary | ICD-10-CM | POA: Diagnosis present

## 2015-02-12 DIAGNOSIS — D3A021 Benign carcinoid tumor of the cecum: Secondary | ICD-10-CM

## 2015-02-12 DIAGNOSIS — Z888 Allergy status to other drugs, medicaments and biological substances status: Secondary | ICD-10-CM

## 2015-02-12 DIAGNOSIS — R05 Cough: Secondary | ICD-10-CM | POA: Diagnosis present

## 2015-02-12 HISTORY — DX: Other muscle spasm: M62.838

## 2015-02-12 HISTORY — DX: Tobacco use: Z72.0

## 2015-02-12 LAB — CBC WITH DIFFERENTIAL/PLATELET
BASOS PCT: 0 %
Basophils Absolute: 0 10*3/uL (ref 0–0.1)
Eosinophils Absolute: 0.1 10*3/uL (ref 0–0.7)
Eosinophils Relative: 2 %
HCT: 39.6 % (ref 35.0–47.0)
Hemoglobin: 13.3 g/dL (ref 12.0–16.0)
Lymphocytes Relative: 12 %
Lymphs Abs: 0.8 10*3/uL — ABNORMAL LOW (ref 1.0–3.6)
MCH: 27.1 pg (ref 26.0–34.0)
MCHC: 33.6 g/dL (ref 32.0–36.0)
MCV: 80.5 fL (ref 80.0–100.0)
MONO ABS: 0.5 10*3/uL (ref 0.2–0.9)
MONOS PCT: 7 %
NEUTROS ABS: 5.7 10*3/uL (ref 1.4–6.5)
Neutrophils Relative %: 79 %
Platelets: 165 10*3/uL (ref 150–440)
RBC: 4.93 MIL/uL (ref 3.80–5.20)
RDW: 17.1 % — ABNORMAL HIGH (ref 11.5–14.5)
WBC: 7.2 10*3/uL (ref 3.6–11.0)

## 2015-02-12 LAB — COMPREHENSIVE METABOLIC PANEL
ALBUMIN: 3.5 g/dL (ref 3.5–5.0)
ALK PHOS: 148 U/L — AB (ref 38–126)
ALT: 33 U/L (ref 14–54)
AST: 44 U/L — AB (ref 15–41)
Anion gap: 8 (ref 5–15)
BILIRUBIN TOTAL: 0.7 mg/dL (ref 0.3–1.2)
BUN: 8 mg/dL (ref 6–20)
CO2: 27 mmol/L (ref 22–32)
Calcium: 10.2 mg/dL (ref 8.9–10.3)
Chloride: 108 mmol/L (ref 101–111)
Creatinine, Ser: 0.69 mg/dL (ref 0.44–1.00)
GFR calc Af Amer: >60 mL/min (ref >60)
GFR calc non Af Amer: >60 mL/min (ref >60)
GLUCOSE: 114 mg/dL — AB (ref 65–99)
POTASSIUM: 3.2 mmol/L — AB (ref 3.5–5.1)
Sodium: 143 mmol/L (ref 135–145)
TOTAL PROTEIN: 7 g/dL (ref 6.5–8.1)

## 2015-02-12 LAB — URINALYSIS COMPLETE WITH MICROSCOPIC (ARMC ONLY)
BILIRUBIN URINE: NEGATIVE
GLUCOSE, UA: NEGATIVE mg/dL
HGB URINE DIPSTICK: NEGATIVE
Ketones, ur: NEGATIVE mg/dL
LEUKOCYTES UA: NEGATIVE
NITRITE: NEGATIVE
PH: 6 (ref 5.0–8.0)
Protein, ur: NEGATIVE mg/dL
SPECIFIC GRAVITY, URINE: 1.004 — AB (ref 1.005–1.030)

## 2015-02-12 LAB — TROPONIN I: Troponin I: <0.03 ng/mL (ref <0.031)

## 2015-02-12 LAB — LACTIC ACID, PLASMA: Lactic Acid, Venous: 1.2 mmol/L (ref 0.5–2.0)

## 2015-02-12 MED ORDER — IPRATROPIUM-ALBUTEROL 0.5-2.5 (3) MG/3ML IN SOLN
RESPIRATORY_TRACT | Status: AC
  Administered 2015-02-12: 3 mL via RESPIRATORY_TRACT
  Filled 2015-02-12: qty 3

## 2015-02-12 MED ORDER — TRAMADOL HCL 50 MG PO TABS
100.0000 mg | ORAL_TABLET | Freq: Once | ORAL | Status: AC
  Administered 2015-02-12: 100 mg via ORAL
  Filled 2015-02-12: qty 2

## 2015-02-12 MED ORDER — METHYLPREDNISOLONE SODIUM SUCC 125 MG IJ SOLR
125.0000 mg | Freq: Once | INTRAMUSCULAR | Status: AC
  Administered 2015-02-12: 125 mg via INTRAVENOUS

## 2015-02-12 MED ORDER — METHYLPREDNISOLONE SODIUM SUCC 125 MG IJ SOLR
INTRAMUSCULAR | Status: AC
  Administered 2015-02-12: 125 mg via INTRAVENOUS
  Filled 2015-02-12: qty 2

## 2015-02-12 MED ORDER — DIAZEPAM 2 MG PO TABS
2.0000 mg | ORAL_TABLET | Freq: Once | ORAL | Status: AC
  Administered 2015-02-12: 2 mg via ORAL
  Filled 2015-02-12: qty 1

## 2015-02-12 MED ORDER — SODIUM CHLORIDE 0.9 % IV BOLUS (SEPSIS)
1000.0000 mL | Freq: Once | INTRAVENOUS | Status: AC
  Administered 2015-02-12: 1000 mL via INTRAVENOUS

## 2015-02-12 MED ORDER — IPRATROPIUM-ALBUTEROL 0.5-2.5 (3) MG/3ML IN SOLN
3.0000 mL | Freq: Once | RESPIRATORY_TRACT | Status: AC
  Administered 2015-02-12: 3 mL via RESPIRATORY_TRACT
  Filled 2015-02-12: qty 3

## 2015-02-12 MED ORDER — IPRATROPIUM-ALBUTEROL 0.5-2.5 (3) MG/3ML IN SOLN
3.0000 mL | Freq: Once | RESPIRATORY_TRACT | Status: AC
  Administered 2015-02-12: 3 mL via RESPIRATORY_TRACT

## 2015-02-12 NOTE — ED Notes (Signed)
Spoke with Dr Jimmye Norman about pt's condition, verbal orders for duoneb x1 and '125mg'$  IV solumedrol.

## 2015-02-12 NOTE — ED Notes (Signed)
To ct scan via stretcher. AAOx3.  Skin warm and dry.  NAD

## 2015-02-12 NOTE — H&P (Signed)
[facility] NAME: Brandy Odom    MR#:  [id number]  DATE OF BIRTH:  1950/05/25  DATE OF ADMISSION:  02/12/2015  PRIMARY CARE PHYSICIAN: No PCP Per Patient   REQUESTING/REFERRING PHYSICIAN: Jacqualine Code, M.D.  CHIEF COMPLAINT:   Chief Complaint  Patient presents with  . Shortness of Breath  . Diplopia    HISTORY OF PRESENT ILLNESS:  Brandy Odom  is a 64 y.o. female who presents with chronic persistent cough and shortness of breath. Patient states that her symptoms have been going on for the last several weeks, and have not improved. She has been seen in urgent care setting ED recently for the same, with treatment with antibiotics and steroid taper without any significant improvement. She has a long smoking history, but has never been diagnosed with COPD. She does not take any medications routinely and has no significant other medical diagnoses. She states that she has lost 20 pounds last 3 months, and on initial onset of her symptoms had significant sweats. She also has increasing fatigue. CT scan in the ED showed lymphadenopathy and pulmonary and extra pulmonary nodules concerning for metastatic malignancy. Hospitalists were called for admission.  PAST MEDICAL HISTORY:   Past Medical History  Diagnosis Date  . Tobacco abuse   . Muscle spasm     PAST SURGICAL HISTORY:   Past Surgical History  Procedure Laterality Date  . Appendectomy      SOCIAL HISTORY:   Social History  Substance Use Topics  . Smoking status: Current Every Day Smoker    Types: Cigarettes  . Smokeless tobacco: Not on file  . Alcohol Use: No    FAMILY HISTORY:   Family History  Problem Relation Age of Onset  . Colon cancer      DRUG ALLERGIES:   Allergies  Allergen Reactions  . Bee Venom Anaphylaxis  . Naproxen Nausea And Vomiting  . Sulfa Antibiotics Other (See Comments)    Reaction:  Unknown   . Antihistamines, Chlorpheniramine-Type  Palpitations    MEDICATIONS AT HOME:   Prior to Admission medications   Medication Sig Start Date End Date Taking? Authorizing Provider  azithromycin (ZITHROMAX) 250 MG tablet Take 250-500 mg by mouth daily. Pt is to take two tablets on day 1 and one tablet for the next four days.   Yes Historical Provider, MD  diazepam (VALIUM) 5 MG tablet Take 1 tablet (5 mg total) by mouth every 8 (eight) hours as needed for muscle spasms. Patient not taking: Reported on 02/12/2015 02/09/15 02/09/16  Loney Hering, MD  HYDROcodone-acetaminophen [facility]) 5-325 MG tablet Take 1 tablet by mouth every 4 (four) hours as needed for moderate pain. Patient not taking: Reported on 02/12/2015 02/04/15   Mortimer Fries, PA-C  predniSONE (STERAPRED UNI-PAK 21 TAB) 10 MG (21) TBPK tablet 6 tablets on day 1, 5 tablets on day 2, 4 tablets on day 3, etc... Patient not taking: Reported on 02/12/2015 02/04/15   Mortimer Fries, PA-C  traMADol (ULTRAM) 50 MG tablet Take 1 tablet (50 mg total) by mouth every 6 (six) hours as needed. Patient not taking: Reported on 02/12/2015 02/09/15   Loney Hering, MD    REVIEW OF SYSTEMS:  Review of Systems  Constitutional: Positive for weight loss and malaise/fatigue. Negative for fever and chills.  HENT: Negative for ear pain, hearing loss and tinnitus.   Eyes: Negative for blurred vision, double vision, pain and redness.  Respiratory: Positive for cough  and shortness of breath. Negative for hemoptysis.   Cardiovascular: Negative for chest pain, palpitations, orthopnea and leg swelling.  Gastrointestinal: Negative for nausea, vomiting, abdominal pain, diarrhea and constipation.  Genitourinary: Negative for dysuria, frequency and hematuria.  Musculoskeletal: Negative for back pain, joint pain and neck pain.  Skin:       No acne, rash, or lesions  Neurological: Negative for dizziness, tremors, focal weakness and weakness.  Endo/Heme/Allergies: Negative for polydipsia. Does not  bruise/bleed easily.  Psychiatric/Behavioral: Negative for depression. The patient is not nervous/anxious and does not have insomnia.      VITAL SIGNS:   Filed Vitals:   02/12/15 1908 02/12/15 2000 02/12/15 2108 02/12/15 2254  BP:  125/86 158/78 126/56  Pulse:   96 94  Temp:      TempSrc:      Resp:  '20 20 18  '$ Height:      Weight: 65.942 kg (145 lb 6 oz)     SpO2:   98% 97%   Wt Readings from Last 3 Encounters:  02/12/15 65.942 kg (145 lb 6 oz)  02/09/15 68.04 kg (150 lb)  02/04/15 65.772 kg (145 lb)    PHYSICAL EXAMINATION:  Physical Exam  Vitals reviewed. Constitutional: She is oriented to person, place, and time. She appears well-developed and well-nourished. No distress.  HENT:  Head: Normocephalic and atraumatic.  Mouth/Throat: Oropharynx is clear and moist.  Eyes: Conjunctivae and EOM are normal. Pupils are equal, round, and reactive to light. No scleral icterus.  Neck: Normal range of motion. Neck supple. No JVD present. No thyromegaly present.  Cardiovascular: Normal rate, regular rhythm and intact distal pulses.  Exam reveals no gallop and no friction rub.   No murmur heard. Respiratory: Effort normal. No respiratory distress. She has no wheezes. She has no rales.  Diffuse bilateral coarse breath sounds  GI: Soft. Bowel sounds are normal. She exhibits no distension. There is no tenderness.  Musculoskeletal: Normal range of motion. She exhibits tenderness (anterior chest wall). She exhibits no edema.  No arthritis, no gout  Lymphadenopathy:    She has no cervical adenopathy.  Neurological: She is alert and oriented to person, place, and time. No cranial nerve deficit.  No dysarthria, no aphasia  Skin: Skin is warm and dry. No rash noted. No erythema.  Psychiatric: She has a normal mood and affect. Her behavior is normal. Judgment and thought content normal.    LABORATORY PANEL:   CBC  Recent Labs Lab 02/12/15 1554  WBC 7.2  HGB 13.3  HCT 39.6  PLT 165    ------------------------------------------------------------------------------------------------------------------  Chemistries   Recent Labs Lab 02/12/15 1554  NA 143  K 3.2*  CL 108  CO2 27  GLUCOSE 114*  BUN 8  CREATININE 0.69  CALCIUM 10.2  AST 44*  ALT 33  ALKPHOS 148*  BILITOT 0.7   ------------------------------------------------------------------------------------------------------------------  Cardiac Enzymes  Recent Labs Lab 02/12/15 1554  TROPONINI <0.03   ------------------------------------------------------------------------------------------------------------------  RADIOLOGY:  Dg Chest 2 View  02/12/2015  CLINICAL DATA:  Chest pain. EXAM: CHEST  2 VIEW COMPARISON:  02/09/2015. FINDINGS: Mediastinum and hilar structures are normal. Bilateral subsegmental atelectasis and/or pleural parenchymal scarring again noted. No pleural effusion or pneumothorax. Heart size normal. No acute bony abnormality. IMPRESSION: Bilateral pleural-parenchymal atelectasis and are scarring. Chest is stable from prior exam . Electronically Signed   By: Marcello Moores  Register   On: 02/12/2015 16:35   Ct Head Wo Contrast  02/12/2015  ADDENDUM REPORT: 02/12/2015 20:53 ADDENDUM: Upon further  examination, there is a small ovoid soft tissue lesion within the medial aspect of the LEFT orbit just superior to the medial rectus muscle (image number 5, series 2). This small lesion is immediately posterior medial to the globe. Exact origin of this lesion and relationship to the intraconal contents cannot be fully evaluated. Recommend MRI of the orbits with contrast for further characterization. Findings conveyed toMARK QUALE on 02/12/2015  at20:49. Electronically Signed   By: Suzy Bouchard M.D.   On: 02/12/2015 20:53  02/12/2015  CLINICAL DATA:  Antibiotics for pneumonia.  Double vision. EXAM: CT HEAD WITHOUT CONTRAST TECHNIQUE: Contiguous axial images were obtained from the base of the skull  through the vertex without intravenous contrast. COMPARISON:  None available FINDINGS: No acute intracranial hemorrhage. No focal mass lesion. No CT evidence of acute infarction. No midline shift or mass effect. No hydrocephalus. Basilar cisterns are patent. Mild generalized atrophy. Paranasal sinuses and  mastoid air cells are clear. 13 mm subcutaneous nodule posterior to the RIGHT year. IMPRESSION: 1. No acute intracranial findings. 2. Probable inflammatory lymph node posterior to the RIGHT year. Electronically Signed: By: Suzy Bouchard M.D. On: 02/12/2015 20:35   Ct Chest Wo Contrast  02/12/2015  CLINICAL DATA:  Pt reports seen here Tuesday, dx with pneumonia; has taken 4 doses of antibiotics and reports no improvement. Pt reports chest congestion and shortness of breath. Pt reports double vision out of left eye, reports started Wednesday. EXAM: CT CHEST WITHOUT CONTRAST TECHNIQUE: Multidetector CT imaging of the chest was performed following the standard protocol without IV contrast. COMPARISON:  Chest radiograph 02/12/2015 FINDINGS: Evaluation of vascular structures and mediastinal structures is limited without IV contrast material. The heart size is normal. There is mild focal pericardial thickening. Calcification of the aorta. Mild ectasia of the ascending thoracic aorta with AP diameter 3.7 cm. Prominent lymph nodes in the mediastinum. Largest lymph node is a a right paratracheal node measuring 2.1 cm in called extrinsic compression on the trachea. Additional mildly enlarged lymph nodes demonstrated throughout the upper mediastinum, pretracheal region, and in the axilla bilaterally. Appearance is nonspecific but could metastatic disease or lymphoma. Esophagus is decompressed. Patchy sub cm ground-glass nodules demonstrated in the right lung base. More solid nodule demonstrated in the left lung base measuring 5 mm. Focal area of irregular scarring in the right middle lung measuring 10 mm diameter.  Early bronchogenic carcinoma is not excluded. Scattered emphysematous changes in the lungs. Heterogeneous areas of lucency and sclerosis throughout the thoracic vertebrae with involvement of vertebral bodies and posterior elements. Focal lesion also demonstrated in the sternum. Multiple expansile right rib lesions. Appearance is consistent with diffuse bone metastasis or possibly myeloma. No focal consolidation.  No pleural effusions.  No pneumothorax. Visualized portions of the upper abdomen demonstrate bilateral adrenal gland nodules, measuring 2.3 cm on the left and 1.9 cm on the right. These are worrisome for metastatic lesions. Nodules in the subcutaneous fat posterior to the right shoulder and anterior to the left lower chest could represent sebaceous cysts or subcutaneous metastases. IMPRESSION: Mixed lucent and sclerotic and expansile lesions demonstrated in the spine, ribs, and sternum likely to represent metastasis or myeloma. Mediastinal lymphadenopathy suggesting metastasis or lymphoma. Bilateral pulmonary nodules with some solid nodules and some patchy ground-glass nodules. Focal spiculation in the right middle lung. New to exclude metastasis or primary lung lesion. Bilateral adrenal gland nodules and skin nodules also likely to represent metastases. Electronically Signed   By: Oren Beckmann.D.  On: 02/12/2015 19:05    EKG:   Orders placed or performed during the hospital encounter of 02/12/15  . ED EKG  . ED EKG    IMPRESSION AND PLAN:  Principal Problem:   Metastasis ([place]) - CT chest showed pulmonary and extra pulmonary nodules and significant lymphadenopathy concerning for metastatic disease. Unclear etiology of primary this point, however CT scan suggestive of lymphoma versus myeloma. Oncology consult ordered. Mild left eyelid droop prompted CT head which showed one nodule in her left orbit just superior to the medial rectus muscle. Recommended MRI for further  characterization. Active Problems:   Hypoxia - patient was initially hypoxic on room air in the ED, supplemental oxygen and brought her O2 sats up nicely. We'll continue O2 for now and continue to monitor.   Dyspnea - improved with nebs and IV steroids and supplemental O2, continues on admission.   Cough - cough suppressant ordered   Tobacco abuse - patient states that she quit about one week prior to admission. She does have 50 year smoking history.  All the records are reviewed and case discussed with ED provider. Management plans discussed with the patient and/or family.  DVT PROPHYLAXIS: SubQ lovenox  ADMISSION STATUS: Inpatient  CODE STATUS: Full  TOTAL TIME TAKING CARE OF THIS PATIENT: 50 minutes.    Tannen Vandezande FIELDING 02/12/2015, 11:33 PM  [facility]  Office  [phone]  CC: Primary care physician; No PCP Per Patient

## 2015-02-12 NOTE — ED Notes (Signed)
Ambulated to BR. Tolerated well.  Skin warm and dry.  No SOB.  Continues to c/o center anterior chest pain.

## 2015-02-12 NOTE — ED Provider Notes (Signed)
[facility] Emergency Department Provider Note REMINDER - THIS NOTE IS NOT A FINAL MEDICAL RECORD UNTIL IT IS SIGNED. UNTIL THEN, THE CONTENT BELOW MAY REFLECT INFORMATION FROM A DOCUMENTATION TEMPLATE, NOT THE ACTUAL PATIENT VISIT. ____________________________________________  Time seen: Approximately 6:31 PM  I have reviewed the triage vital signs and the nursing notes.   HISTORY  Chief Complaint Shortness of Breath and Diplopia    HPI Brandy Odom is a 64 y.o. female presents with ongoing cough for about the last 3 weeks. She reports increasing cough and mild shortness of breath despite being treated with 3 days of antibiotics or recent steroid taper. She reports all her symptoms are about 3 weeks ago, progressively worsened with ongoing cough.  She does report an achy pain from coughing, but no other significant discomfort. She reports her cough and chills have been ongoing and worsening over the last 3 weeks. Despite treatment she continues to worsen.     Past Medical History  Diagnosis Date  . Tobacco abuse   . Muscle spasm     Patient Active Problem List   Diagnosis Date Noted  . Dyspnea 02/12/2015  . Cough 02/12/2015  . Tobacco abuse 02/12/2015  . Metastasis ([place]) 02/12/2015  . Hypoxia 02/12/2015    Past Surgical History  Procedure Laterality Date  . Appendectomy      No current outpatient prescriptions on file.  Allergies Bee venom; Naproxen; Sulfa antibiotics; and Antihistamines, chlorpheniramine-type  Family History  Problem Relation Age of Onset  . Colon cancer      Social History Social History  Substance Use Topics  . Smoking status: Current Every Day Smoker    Types: Cigarettes  . Smokeless tobacco: None  . Alcohol Use: No    Review of Systems Constitutional: Fevers and chills Eyes: No visual changes. ENT: No sore throat. Cardiovascular: Denies chest pain. Occasional achy discomfort in the area of the ribs  when having coughing fits. Respiratory: Moderate shortness of breath with very thick cough Gastrointestinal: No abdominal pain.  No nausea, no vomiting.  No diarrhea.  No constipation. Genitourinary: Negative for dysuria. Musculoskeletal: Negative for back pain. Skin: Negative for rash. Neurological: Negative for headaches, focal weakness or numbness.  is generally "weak all over"  10-point ROS otherwise negative.  ____________________________________________   PHYSICAL EXAM:  VITAL SIGNS: ED Triage Vitals  Enc Vitals Group     BP 02/12/15 1551 137/88 mmHg     Pulse Rate 02/12/15 1551 122     Resp 02/12/15 1551 24     Temp 02/12/15 1551 98.4 F (36.9 C)     Temp Source 02/12/15 1551 Oral     SpO2 02/12/15 1551 93 %     Weight 02/12/15 1551 148 lb (67.132 kg)     Height 02/12/15 1551 '5\' 4"'$  (1.626 m)     Head Cir --      Peak Flow --      Pain Score 02/12/15 1551 9     Pain Loc --      Pain Edu? --      Excl. in [place]? --    Constitutional: Alert and oriented. Well appearing mildly dyspneic Eyes: Conjunctivae are normal. PERRL. EOMI. Head: Atraumatic. Nose: No congestion/rhinnorhea. Mouth/Throat: Mucous membranes are dry.  Oropharynx non-erythematous. Neck: No stridor.   Cardiovascular: Tachycardic rate, regular rhythm. Grossly normal heart sounds.  Good peripheral circulation. Respiratory: Mild increased work of breathing, minimal use of accessory muscles. No distress, but does appears slightly dyspneic.  She has very coarse and central rhonchi. Mild end expiratory wheezing. No clear rales. Gastrointestinal: Soft and nontender. No distention. No abdominal bruits. No CVA tenderness. Musculoskeletal: No lower extremity tenderness nor edema.  No joint effusions. Neurologic:  Normal speech and language. No gross focal neurologic deficits are appreciated. Skin:  Skin is warm, dry and intact. No rash noted. Psychiatric: Mood and affect are normal. Speech and behavior are  normal.  ____________________________________________   LABS (all labs ordered are listed, but only abnormal results are displayed)  Labs Reviewed  COMPREHENSIVE METABOLIC PANEL - Abnormal; Notable for the following:    Potassium 3.2 (*)    Glucose, Bld 114 (*)    AST 44 (*)    Alkaline Phosphatase 148 (*)    All other components within normal limits  CBC WITH DIFFERENTIAL/PLATELET - Abnormal; Notable for the following:    RDW 17.1 (*)    Lymphs Abs 0.8 (*)    All other components within normal limits  URINALYSIS COMPLETEWITH MICROSCOPIC ([facility] ONLY) - Abnormal; Notable for the following:    Color, Urine YELLOW (*)    APPearance CLEAR (*)    Specific Gravity, Urine 1.004 (*)    Bacteria, UA RARE (*)    Squamous Epithelial / LPF 0-5 (*)    All other components within normal limits  CULTURE, BLOOD (ROUTINE X 2)  CULTURE, BLOOD (ROUTINE X 2)  URINE CULTURE  LACTIC ACID, PLASMA  TROPONIN I  CBC  CREATININE, SERUM  MAGNESIUM  PHOSPHORUS  BASIC METABOLIC PANEL  CBC   ____________________________________________  EKG   Reviewed and interpreted by me Sinus tachycardia Ventricular rate 105 PR 136 QRS 90 QTc 450 Nonspecific T-wave abnormality noted in inferior distribution with minor T-wave depressions, consider ischemic abnormality. ____________________________________________  RADIOLOGY   CT Head Wo Contrast (Edited Result - FINAL) Result time: 02/12/15 20:53:15   Addendum 1 of 1 by Rad Results In Interface (02/12/15 20:53:15)   ADDENDUM REPORT: 02/12/2015 20:53 ADDENDUM: Upon further examination, there is a small ovoid soft tissue lesion within the medial aspect of the LEFT orbit just superior to the medial rectus muscle (image number 5, series 2). This small lesion is immediately posterior medial to the globe. Exact origin of this lesion and relationship to the intraconal contents cannot be fully evaluated. Recommend MRI of the orbits with contrast for  further characterization. Findings conveyed toMARK QUALE on 02/12/2015 at20:49. Electronically Signed  By: Suzy Bouchard M.D.  On: 02/12/2015 20:53      Final result by Rad Results In Interface (02/12/15 20:35:46)   Narrative:   CLINICAL DATA: Antibiotics for pneumonia. Double vision.  EXAM: CT HEAD WITHOUT CONTRAST  TECHNIQUE: Contiguous axial images were obtained from the base of the skull through the vertex without intravenous contrast.  COMPARISON: None available  FINDINGS: No acute intracranial hemorrhage. No focal mass lesion. No CT evidence of acute infarction. No midline shift or mass effect. No hydrocephalus. Basilar cisterns are patent.  Mild generalized atrophy.  Paranasal sinuses and mastoid air cells are clear.  13 mm subcutaneous nodule posterior to the RIGHT year.  IMPRESSION: 1. No acute intracranial findings. 2. Probable inflammatory lymph node posterior to the RIGHT year.   Electronically Signed By: Suzy Bouchard M.D. On: 02/12/2015 20:35          CT Chest Wo Contrast (Final result) Result time: 02/12/15 19:05:53   Final result by Rad Results In Interface (02/12/15 19:05:53)   Narrative:   CLINICAL DATA: Pt reports seen here Tuesday, dx  with pneumonia; has taken 4 doses of antibiotics and reports no improvement. Pt reports chest congestion and shortness of breath. Pt reports double vision out of left eye, reports started Wednesday.  EXAM: CT CHEST WITHOUT CONTRAST  TECHNIQUE: Multidetector CT imaging of the chest was performed following the standard protocol without IV contrast.  COMPARISON: Chest radiograph 02/12/2015  FINDINGS: Evaluation of vascular structures and mediastinal structures is limited without IV contrast material. The heart size is normal. There is mild focal pericardial thickening. Calcification of the aorta. Mild ectasia of the ascending thoracic aorta with AP diameter 3.7 cm. Prominent  lymph nodes in the mediastinum. Largest lymph node is a a right paratracheal node measuring 2.1 cm in called extrinsic compression on the trachea. Additional mildly enlarged lymph nodes demonstrated throughout the upper mediastinum, pretracheal region, and in the axilla bilaterally. Appearance is nonspecific but could metastatic disease or lymphoma. Esophagus is decompressed.  Patchy sub cm ground-glass nodules demonstrated in the right lung base. More solid nodule demonstrated in the left lung base measuring 5 mm. Focal area of irregular scarring in the right middle lung measuring 10 mm diameter. Early bronchogenic carcinoma is not excluded. Scattered emphysematous changes in the lungs. Heterogeneous areas of lucency and sclerosis throughout the thoracic vertebrae with involvement of vertebral bodies and posterior elements. Focal lesion also demonstrated in the sternum. Multiple expansile right rib lesions. Appearance is consistent with diffuse bone metastasis or possibly myeloma.  No focal consolidation. No pleural effusions. No pneumothorax.  Visualized portions of the upper abdomen demonstrate bilateral adrenal gland nodules, measuring 2.3 cm on the left and 1.9 cm on the right. These are worrisome for metastatic lesions. Nodules in the subcutaneous fat posterior to the right shoulder and anterior to the left lower chest could represent sebaceous cysts or subcutaneous metastases.  IMPRESSION: Mixed lucent and sclerotic and expansile lesions demonstrated in the spine, ribs, and sternum likely to represent metastasis or myeloma.  Mediastinal lymphadenopathy suggesting metastasis or lymphoma.  Bilateral pulmonary nodules with some solid nodules and some patchy ground-glass nodules. Focal spiculation in the right middle lung. New to exclude metastasis or primary lung lesion.  Bilateral adrenal gland nodules and skin nodules also likely to represent  metastases.   Electronically Signed By: Lucienne Capers M.D. On: 02/12/2015 19:05          DG Chest 2 View (Final result) Result time: 02/12/15 16:35:57   Final result by Rad Results In Interface (02/12/15 16:35:57)   Narrative:   CLINICAL DATA: Chest pain.  EXAM: CHEST 2 VIEW  COMPARISON: 02/09/2015.  FINDINGS: Mediastinum and hilar structures are normal. Bilateral subsegmental atelectasis and/or pleural parenchymal scarring again noted. No pleural effusion or pneumothorax. Heart size normal. No acute bony abnormality.  IMPRESSION: Bilateral pleural-parenchymal atelectasis and are scarring. Chest is stable from prior exam .      ____________________________________________   PROCEDURES  Procedure(s) performed: None  Critical Care performed: No  ____________________________________________   INITIAL IMPRESSION / ASSESSMENT AND PLAN / ED COURSE  Pertinent labs & imaging results that were available during my care of the patient were reviewed by me and considered in my medical decision making (see chart for details).  Patient presents for ongoing dyspnea. She reports she was recently diagnosed with "pneumonia" and also placed on azithromycin. She reports despite this she said progressively worsening cough and shortness of breath over last 3 weeks. She is a long-time smoker. Her exam is concerning for coarse central rhonchi and possible bronchitis is considered, however given her  long smoking history lung tumor or infiltrate and failed outpatient therapy also considered. The patient's oxygen saturation as low as 88% on room air after breathing treatment in the ER. At this point, given her ongoing symptomatology we'll obtain CT imaging, no significant signs or symptoms or risk factors for pulmonary embolism and she does not appear to have any acute pleuritic type pain in her symptoms seem to be infectious in etiology. No evidence acute cardiac disease. I will  add a troponin however.  Based on her mild oxygen requirement, ongoing symptomatology, and worsening condition and tachycardia despite outpatient treatment I anticipate admission for further workup and treatment.  ----------------------------------------- 9:08 PM on 02/12/2015 -----------------------------------------  Patient reports improvement, she does report feeling very anxious though secondary to discussion of a possible metastatic lesion. Based on her mild oxygen requirement, we will admit the patient to hospital for ongoing management by hospitalist service. At this point, is unclear to me if the patient actually has acute infectious symptoms such as bronchitis or if this may be secondary to tumor, or other oncologic process which appears to be likely active as well. ____________________________________________   FINAL CLINICAL IMPRESSION(S) / ED DIAGNOSES  Final diagnoses:  Acute bronchitis, unspecified organism  Lesion of lung      Delman Kitten, MD 02/18/15 2344

## 2015-02-12 NOTE — ED Notes (Signed)
Pt reports seen here Tuesday, dx with pneumonia; has taken 4 doses of antibiotics and reports no improvement. Pt reports chest congestion and shortness of breath. Pt reports double vision out of left eye, reports started Wednesday.

## 2015-02-13 DIAGNOSIS — R948 Abnormal results of function studies of other organs and systems: Secondary | ICD-10-CM

## 2015-02-13 DIAGNOSIS — R911 Solitary pulmonary nodule: Secondary | ICD-10-CM | POA: Insufficient documentation

## 2015-02-13 DIAGNOSIS — M899 Disorder of bone, unspecified: Secondary | ICD-10-CM | POA: Insufficient documentation

## 2015-02-13 DIAGNOSIS — R630 Anorexia: Secondary | ICD-10-CM

## 2015-02-13 DIAGNOSIS — H0589 Other disorders of orbit: Secondary | ICD-10-CM | POA: Insufficient documentation

## 2015-02-13 DIAGNOSIS — R0602 Shortness of breath: Secondary | ICD-10-CM

## 2015-02-13 DIAGNOSIS — N838 Other noninflammatory disorders of ovary, fallopian tube and broad ligament: Secondary | ICD-10-CM

## 2015-02-13 DIAGNOSIS — R634 Abnormal weight loss: Secondary | ICD-10-CM

## 2015-02-13 DIAGNOSIS — R110 Nausea: Secondary | ICD-10-CM

## 2015-02-13 DIAGNOSIS — H532 Diplopia: Secondary | ICD-10-CM

## 2015-02-13 DIAGNOSIS — R942 Abnormal results of pulmonary function studies: Secondary | ICD-10-CM

## 2015-02-13 LAB — PHOSPHORUS: PHOSPHORUS: 3.8 mg/dL (ref 2.5–4.6)

## 2015-02-13 LAB — HEPATIC FUNCTION PANEL
ALT: 29 U/L (ref 14–54)
AST: 37 U/L (ref 15–41)
Albumin: 3 g/dL — ABNORMAL LOW (ref 3.5–5.0)
Alkaline Phosphatase: 125 U/L (ref 38–126)
Bilirubin, Direct: 0.1 mg/dL (ref 0.1–0.5)
Indirect Bilirubin: 0.4 mg/dL (ref 0.3–0.9)
Total Bilirubin: 0.5 mg/dL (ref 0.3–1.2)
Total Protein: 6 g/dL — ABNORMAL LOW (ref 6.5–8.1)

## 2015-02-13 LAB — BASIC METABOLIC PANEL
ANION GAP: 6 (ref 5–15)
BUN: 8 mg/dL (ref 6–20)
CALCIUM: 9.6 mg/dL (ref 8.9–10.3)
CO2: 28 mmol/L (ref 22–32)
Chloride: 111 mmol/L (ref 101–111)
Creatinine, Ser: 0.64 mg/dL (ref 0.44–1.00)
GLUCOSE: 121 mg/dL — AB (ref 65–99)
POTASSIUM: 3.6 mmol/L (ref 3.5–5.1)
SODIUM: 145 mmol/L (ref 135–145)

## 2015-02-13 LAB — CBC
HEMATOCRIT: 35 % (ref 35.0–47.0)
HEMOGLOBIN: 11.6 g/dL — AB (ref 12.0–16.0)
MCH: 27.1 pg (ref 26.0–34.0)
MCHC: 33.1 g/dL (ref 32.0–36.0)
MCV: 81.6 fL (ref 80.0–100.0)
Platelets: 142 10*3/uL — ABNORMAL LOW (ref 150–440)
RBC: 4.29 MIL/uL (ref 3.80–5.20)
RDW: 16.9 % — ABNORMAL HIGH (ref 11.5–14.5)
WBC: 6 10*3/uL (ref 3.6–11.0)

## 2015-02-13 LAB — MAGNESIUM: MAGNESIUM: 2 mg/dL (ref 1.7–2.4)

## 2015-02-13 MED ORDER — ONDANSETRON HCL 4 MG PO TABS: 4.0000 mg | ORAL_TABLET | Freq: Four times a day (QID) | ORAL | Status: DC | PRN

## 2015-02-13 MED ORDER — HYDROCODONE-ACETAMINOPHEN 5-325 MG PO TABS
1.0000 | ORAL_TABLET | ORAL | Status: DC | PRN
  Administered 2015-02-13 – 2015-02-17 (×8): 1 via ORAL
  Filled 2015-02-13 (×9): qty 1

## 2015-02-13 MED ORDER — DIAZEPAM 2 MG PO TABS
2.0000 mg | ORAL_TABLET | Freq: Four times a day (QID) | ORAL | Status: DC | PRN
  Administered 2015-02-13 – 2015-02-19 (×15): 2 mg via ORAL
  Filled 2015-02-13 (×16): qty 1

## 2015-02-13 MED ORDER — NICOTINE 21 MG/24HR TD PT24
21.0000 mg | MEDICATED_PATCH | Freq: Every day | TRANSDERMAL | Status: DC
  Administered 2015-02-13 – 2015-02-19 (×7): 21 mg via TRANSDERMAL
  Filled 2015-02-13 (×7): qty 1

## 2015-02-13 MED ORDER — IPRATROPIUM-ALBUTEROL 0.5-2.5 (3) MG/3ML IN SOLN
3.0000 mL | RESPIRATORY_TRACT | Status: DC | PRN
  Administered 2015-02-14: 3 mL via RESPIRATORY_TRACT
  Filled 2015-02-13: qty 3

## 2015-02-13 MED ORDER — HYDROMORPHONE HCL 1 MG/ML IJ SOLN
2.0000 mg | Freq: Once | INTRAMUSCULAR | Status: AC
  Administered 2015-02-13: 14:00:00 2 mg via INTRAVENOUS
  Filled 2015-02-13: qty 2

## 2015-02-13 MED ORDER — ACETAMINOPHEN 325 MG PO TABS: 650.0000 mg | ORAL_TABLET | Freq: Four times a day (QID) | ORAL | Status: DC | PRN

## 2015-02-13 MED ORDER — ACETAMINOPHEN 650 MG RE SUPP
650.0000 mg | Freq: Four times a day (QID) | RECTAL | Status: DC | PRN
  Filled 2015-02-13: qty 1

## 2015-02-13 MED ORDER — METHYLPREDNISOLONE SODIUM SUCC 125 MG IJ SOLR
60.0000 mg | Freq: Two times a day (BID) | INTRAMUSCULAR | Status: DC
  Administered 2015-02-13 – 2015-02-14 (×3): 60 mg via INTRAVENOUS
  Filled 2015-02-13 (×3): qty 2

## 2015-02-13 MED ORDER — BENZONATATE 100 MG PO CAPS
200.0000 mg | ORAL_CAPSULE | Freq: Three times a day (TID) | ORAL | Status: DC | PRN
  Administered 2015-02-15: 23:00:00 200 mg via ORAL
  Filled 2015-02-13: qty 2

## 2015-02-13 MED ORDER — ENSURE ENLIVE PO LIQD
237.0000 mL | Freq: Three times a day (TID) | ORAL | Status: DC
  Administered 2015-02-13 – 2015-02-19 (×6): 237 mL via ORAL

## 2015-02-13 MED ORDER — ENOXAPARIN SODIUM 40 MG/0.4ML ~~LOC~~ SOLN
40.0000 mg | SUBCUTANEOUS | Status: DC
  Administered 2015-02-13 – 2015-02-18 (×6): 40 mg via SUBCUTANEOUS
  Filled 2015-02-13 (×6): qty 0.4

## 2015-02-13 MED ORDER — ONDANSETRON HCL 4 MG/2ML IJ SOLN: 4.0000 mg | Freq: Four times a day (QID) | INTRAMUSCULAR | Status: DC | PRN

## 2015-02-13 MED ORDER — MORPHINE SULFATE (PF) 2 MG/ML IV SOLN
2.0000 mg | INTRAVENOUS | Status: DC | PRN
  Administered 2015-02-13 – 2015-02-14 (×4): 2 mg via INTRAVENOUS
  Filled 2015-02-13 (×4): qty 1

## 2015-02-13 NOTE — Plan of Care (Addendum)
Pt tearful about new diagnosis, prn anxiety meds given with some improvement, Oncology in to see pt, husband at bedside some of the day  Problem: Safety: Goal: Ability to remain free from injury will improve Outcome: Progressing Pt is high falls, call bell within reach, bed alarm in use, hourly safety rounds  Problem: Pain Managment: Goal: General experience of comfort will improve Outcome: Progressing Pt c/o chest pain, prn meds given with some improvement, pt resting comfortably in bed

## 2015-02-13 NOTE — Progress Notes (Signed)
24 hour urine collection started '@1715'$ .  Urine container in pt's BR on ice.  Clarise Cruz, RN

## 2015-02-13 NOTE — Progress Notes (Signed)
[place] [facility] [place] NAME: Brandy Odom    MR#:  [id number]  DATE OF BIRTH:  October 01, 1950  SUBJECTIVE: Admitted for chronic cough, persistent shortness of breath. Noted to have a weight loss of 20 pounds in last 3 months. 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 Patient has a lot of back pain and also chest pain requesting pain medication. Shortness of breath is better.   CHIEF COMPLAINT:   Chief Complaint  Patient presents with  . Shortness of Breath  . Diplopia    REVIEW OF SYSTEMS:   Review of Systems  Constitutional: Negative for fever and chills.  HENT: Negative for hearing loss.   Eyes: Negative for blurred vision, double vision and photophobia.  Respiratory: Positive for cough and shortness of breath. Negative for hemoptysis.   Cardiovascular: Negative for palpitations, orthopnea and leg swelling.  Gastrointestinal: Negative for vomiting, abdominal pain and diarrhea.  Genitourinary: Negative for dysuria and urgency.  Musculoskeletal: Negative for myalgias and neck pain.       Upper and lower back pain, chest pain all over the chest.  Skin: Negative for rash.  Neurological: Negative for dizziness, focal weakness, seizures, weakness and headaches.  Psychiatric/Behavioral: Negative for memory loss. The patient does not have insomnia.      DRUG ALLERGIES:   Allergies  Allergen Reactions  . Bee Venom Anaphylaxis  . Naproxen Nausea And Vomiting  . Sulfa Antibiotics Other (See Comments)    Reaction:  Unknown   . Antihistamines, Chlorpheniramine-Type Palpitations    VITALS:  Blood pressure 145/74, pulse 80, temperature 97.6 F (36.4 C), temperature source Oral, resp. rate 18, height '5\' 4"'$  (1.626 m), weight 63.458 kg (139 lb 14.4 oz), SpO2 98 %.  PHYSICAL EXAMINATION:  GENERAL:  64 y.o.-year-old patient lying in the bed with no acute distress.  EYES: Pupils equal, round, reactive to light and accommodation. No scleral  icterus. Extraocular muscles intact.  HEENT: Head atraumatic, normocephalic. Oropharynx and nasopharynx clear.  NECK:  Supple, no jugular venous distention. No thyroid enlargement, no tenderness.  LUNGS: Normal breath sounds bilaterally, no wheezing, rales,rhonchi or crepitation. No use of accessory muscles of respiration.  CARDIOVASCULAR: S1, S2 normal. No murmurs, rubs, or gallops.  ABDOMEN: Soft, nontender, nondistended. Bowel sounds present. No organomegaly or mass.  EXTREMITIES: No pedal edema, cyanosis, or clubbing.  NEUROLOGIC: Cranial nerves II through XII are intact. Muscle strength 5/5 in all extremities. Sensation intact. Gait not checked.  PSYCHIATRIC: The patient is alert and oriented x 3.  SKIN: No obvious rash, lesion, or ulcer.    LABORATORY PANEL:   CBC  Recent Labs Lab 02/13/15 0540  WBC 6.0  HGB 11.6*  HCT 35.0  PLT 142*   ------------------------------------------------------------------------------------------------------------------  Chemistries   Recent Labs Lab 02/12/15 1554 02/13/15 0540  NA 143 145  K 3.2* 3.6  CL 108 111  CO2 27 28  GLUCOSE 114* 121*  BUN 8 8  CREATININE 0.69 0.64  CALCIUM 10.2 9.6  MG  --  2.0  AST 44*  --   ALT 33  --   ALKPHOS 148*  --   BILITOT 0.7  --    ------------------------------------------------------------------------------------------------------------------  Cardiac Enzymes  Recent Labs Lab 02/12/15 1554  TROPONINI <0.03   ------------------------------------------------------------------------------------------------------------------  RADIOLOGY:  Dg Chest 2 View  02/12/2015  CLINICAL DATA:  Chest pain. EXAM: CHEST  2 VIEW COMPARISON:  02/09/2015. FINDINGS: Mediastinum and hilar structures are normal. Bilateral subsegmental atelectasis and/or pleural parenchymal scarring again noted.  No pleural effusion or pneumothorax. Heart size normal. No acute bony abnormality. IMPRESSION: Bilateral  pleural-parenchymal atelectasis and are scarring. Chest is stable from prior exam . Electronically Signed   By: Marcello Moores  Register   On: 02/12/2015 16:35   Ct Head Wo Contrast  02/12/2015  ADDENDUM REPORT: 02/12/2015 20:53 ADDENDUM: Upon further examination, there is a small ovoid soft tissue lesion within the medial aspect of the LEFT orbit just superior to the medial rectus muscle (image number 5, series 2). This small lesion is immediately posterior medial to the globe. Exact origin of this lesion and relationship to the intraconal contents cannot be fully evaluated. Recommend MRI of the orbits with contrast for further characterization. Findings conveyed toMARK QUALE on 02/12/2015  at20:49. Electronically Signed   By: Suzy Bouchard M.D.   On: 02/12/2015 20:53  02/12/2015  CLINICAL DATA:  Antibiotics for pneumonia.  Double vision. EXAM: CT HEAD WITHOUT CONTRAST TECHNIQUE: Contiguous axial images were obtained from the base of the skull through the vertex without intravenous contrast. COMPARISON:  None available FINDINGS: No acute intracranial hemorrhage. No focal mass lesion. No CT evidence of acute infarction. No midline shift or mass effect. No hydrocephalus. Basilar cisterns are patent. Mild generalized atrophy. Paranasal sinuses and  mastoid air cells are clear. 13 mm subcutaneous nodule posterior to the RIGHT year. IMPRESSION: 1. No acute intracranial findings. 2. Probable inflammatory lymph node posterior to the RIGHT year. Electronically Signed: By: Suzy Bouchard M.D. On: 02/12/2015 20:35   Ct Chest Wo Contrast  02/12/2015  CLINICAL DATA:  Pt reports seen here Tuesday, dx with pneumonia; has taken 4 doses of antibiotics and reports no improvement. Pt reports chest congestion and shortness of breath. Pt reports double vision out of left eye, reports started Wednesday. EXAM: CT CHEST WITHOUT CONTRAST TECHNIQUE: Multidetector CT imaging of the chest was performed following the standard protocol  without IV contrast. COMPARISON:  Chest radiograph 02/12/2015 FINDINGS: Evaluation of vascular structures and mediastinal structures is limited without IV contrast material. The heart size is normal. There is mild focal pericardial thickening. Calcification of the aorta. Mild ectasia of the ascending thoracic aorta with AP diameter 3.7 cm. Prominent lymph nodes in the mediastinum. Largest lymph node is a a right paratracheal node measuring 2.1 cm in called extrinsic compression on the trachea. Additional mildly enlarged lymph nodes demonstrated throughout the upper mediastinum, pretracheal region, and in the axilla bilaterally. Appearance is nonspecific but could metastatic disease or lymphoma. Esophagus is decompressed. Patchy sub cm ground-glass nodules demonstrated in the right lung base. More solid nodule demonstrated in the left lung base measuring 5 mm. Focal area of irregular scarring in the right middle lung measuring 10 mm diameter. Early bronchogenic carcinoma is not excluded. Scattered emphysematous changes in the lungs. Heterogeneous areas of lucency and sclerosis throughout the thoracic vertebrae with involvement of vertebral bodies and posterior elements. Focal lesion also demonstrated in the sternum. Multiple expansile right rib lesions. Appearance is consistent with diffuse bone metastasis or possibly myeloma. No focal consolidation.  No pleural effusions.  No pneumothorax. Visualized portions of the upper abdomen demonstrate bilateral adrenal gland nodules, measuring 2.3 cm on the left and 1.9 cm on the right. These are worrisome for metastatic lesions. Nodules in the subcutaneous fat posterior to the right shoulder and anterior to the left lower chest could represent sebaceous cysts or subcutaneous metastases. IMPRESSION: Mixed lucent and sclerotic and expansile lesions demonstrated in the spine, ribs, and sternum likely to represent metastasis or myeloma. Mediastinal  lymphadenopathy suggesting  metastasis or lymphoma. Bilateral pulmonary nodules with some solid nodules and some patchy ground-glass nodules. Focal spiculation in the right middle lung. New to exclude metastasis or primary lung lesion. Bilateral adrenal gland nodules and skin nodules also likely to represent metastases. Electronically Signed   By: Lucienne Capers M.D.   On: 02/12/2015 19:05    EKG:   Orders placed or performed during the hospital encounter of 02/12/15  . ED EKG  . ED EKG    ASSESSMENT AND PLAN:   r 1 shortness of breath and hypoxia was multifactorial secondary to COPD exacerbation, malignancy. Her COPD exacerbation she is on oxygen, Solu-Medrol, nebulizers. Continue them #2 shortness of breath and cough and weight loss secondary to mediastinal lymphadenopathy with possible lymphoma versus myeloma. Due to her extensive smoking history unable to exclude primary lung lesion. 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 Her chest pain and back pain are likely related to her metastasis. She is requesting Valium for anxiety. Continue Valium, continue Percocet for pain control. Appreciate oncology follow up. Discussed that with patient and she is aware that she may have metastasis.  All the records are reviewed and case discussed with Care Management/Social Workerr. Management plans discussed with the patient, family and they are in agreement.  CODE STATUS: full  TOTAL TIME TAKING CARE OF THIS PATIENT: 35 minutes.   POSSIBLE D/C IN 1-2DAYS, DEPENDING ON CLINICAL CONDITION.   Epifanio Lesches M.D on 02/13/2015 at 10:14 AM  Between 7am to 6pm - Pager - [phone]  After 6pm go to [email or web address] - password [email or web address] [facility]  [facility]  Office  [phone]  CC: Primary care physician; No PCP Per Patient   Note: This dictation was prepared with Dragon dictation along with smaller phrase technology. Any transcriptional errors that result from this process are  unintentional.

## 2015-02-13 NOTE — Progress Notes (Signed)
Initial Nutrition Assessment  DOCUMENTATION CODES:   Severe malnutrition in context of chronic illness  INTERVENTION:  Meals and snacks: Pt requesting homemade milkshake this am and kitchen called to deliver. Cater to pt preferences Medical Nutrition Supplement Therapy: Recommend ensure TID for added nutrition   NUTRITION DIAGNOSIS:   Inadequate oral intake related to poor appetite as evidenced by per patient/family report, percent weight loss.    GOAL:   Patient will meet greater than or equal to 90% of their needs    MONITOR:    (Energy intake, Pulmonary profile)  REASON FOR ASSESSMENT:   Malnutrition Screening Tool    ASSESSMENT:      Pt admitted with shortness of breath, malignancy, COPD exacerbation  Past Medical History  Diagnosis Date  . Tobacco abuse   . Muscle spasm     Current Nutrition: ate few bites this am  Food/Nutrition-Related History: pt reports poor po intake for the last 3 months secondary to not feeling well, shortness of breath   Scheduled Medications:  . enoxaparin (LOVENOX) injection  40 mg Subcutaneous Q24H  . methylPREDNISolone (SOLU-MEDROL) injection  60 mg Intravenous Q12H  . nicotine  21 mg Transdermal Daily        Electrolyte/Renal Profile and Glucose Profile:   Recent Labs Lab 02/09/15 0601 02/12/15 1554 02/13/15 0540  NA 141 143 145  K 3.3* 3.2* 3.6  CL 107 108 111  CO2 '25 27 28  '$ BUN '19 8 8  '$ CREATININE 0.92 0.69 0.64  CALCIUM 10.1 10.2 9.6  MG  --   --  2.0  PHOS  --   --  3.8  GLUCOSE 118* 114* 121*   Protein Profile:   Recent Labs Lab 02/09/15 0601 02/12/15 1554  ALBUMIN 4.0 3.5    Gastrointestinal Profile: Last BM:   Nutrition-Focused Physical Exam Findings: Nutrition-Focused physical exam completed. Findings are no fat depletion, normal to mild/moderate muscle depletion, and none edema.      Weight Change: Pt reports 28 pound weight loss in the last 3 months (17% weight loss in 3  months)    Diet Order:  Diet regular Room service appropriate?: Yes; Fluid consistency:: Thin  Skin:   reviewed   Height:   Ht Readings from Last 1 Encounters:  02/12/15 '5\' 4"'$  (1.626 m)    Weight:   Wt Readings from Last 1 Encounters:  02/13/15 139 lb 14.4 oz (63.458 kg)    Ideal Body Weight:     BMI:  Body mass index is 24 kg/(m^2).  Estimated Nutritional Needs:   Kcal:  BEE 1165 kcals (IF 1.0-1.2, AF 1.3) 4481-8563 kcals/d.   Protein:  (1.1-1.3 g/kg) 69-82 g/d  Fluid:  (25-85m/kg) 1575-18941md  EDUCATION NEEDS:   No education needs identified at this time  HIGH Care Level  Sondi Desch B. AlZenia [email or web address])

## 2015-02-13 NOTE — Plan of Care (Signed)
Problem: Education: Goal: Knowledge of [facility] Education information/materials will improve Outcome: Progressing Welcome packet reviewed and fall prevention discussed.  Pt put on high falls r/t reported weakness and unsteadiness. Educated on Lovenox injection and upcoming Solumedrol. Discussed pain rating scale. Answered all questions appropriately.  Problem: Safety: Goal: Ability to remain free from injury will improve Outcome: Progressing Pt states understanding of fall precaution and the need to call for assistance with any needs. Call bell and phone within reach.  Bed in lowest position.  Bed alarm on.     Problem: Pain Managment: Goal: General experience of comfort will improve Outcome: Progressing Pt complains of minimal pain in chest/abdomen.  Declines meds at this time.

## 2015-02-13 NOTE — Progress Notes (Signed)
[facility]  Date of admission:  02/12/2015  Inpatient day:  02/13/2015  Consulting physician:  Dr. Lance Coon   Reason for Consultation:  Likely metastasis and lymphadenopathy on CT scan  Chief Complaint: Brandy Odom is a 64 y.o. female with a greater than 50 pack year smoking history who was admitted through the emergency room with shortness of breath and imaging studies revealing metastatic disease.  HPI: The patient notes a significant smoking history. She has smoked for 50 up to 2 packs a day, but then cut back to half a pack a day.  She quit smoking about 2 weeks ago. Regarding her general health care, she has no primary care physician. She has not had a mammogram in years.  She has never had a colonoscopy.  She started feeling ill about 3 months ago. Initially felt like she had a horrible flu.  She noted some fever and sweating. Since 12/2014, she has lost 28 pounds secondary to a poor appetite. She cannot the smell of food.  She has had an off/on low grade fever.  She has been seen in the emergency room or the acute care clinic 3 times the past 2 weeks. She notes a sharp pain across her upper chest, hips, back, and legs. She notes that it's hard to breathe. She has been prescribed cough syrup as well as antibiotics and steroids in the past 2 visits. She has had some nausea. Over the past month and a half, she notes right visual field changes including diplopia.  Past Medical History  Diagnosis Date  . Tobacco abuse   . Muscle spasm     Past Surgical History  Procedure Laterality Date  . Appendectomy      Family History  Problem Relation Age of Onset  . Colon cancer      Social History:  reports that she has been smoking Cigarettes.  She does not have any smokeless tobacco history on file. She reports that she does not drink alcohol or use illicit drugs.  She lives in [place] with her husband who accompanies her today.  Allergies:  Allergies   Allergen Reactions  . Bee Venom Anaphylaxis  . Naproxen Nausea And Vomiting  . Sulfa Antibiotics Other (See Comments)    Reaction:  Unknown   . Antihistamines, Chlorpheniramine-Type Palpitations    Medications Prior to Admission  Medication Sig Dispense Refill  . azithromycin (ZITHROMAX) 250 MG tablet Take 250-500 mg by mouth daily. Pt is to take two tablets on day 1 and one tablet for the next four days.    . diazepam (VALIUM) 5 MG tablet Take 1 tablet (5 mg total) by mouth every 8 (eight) hours as needed for muscle spasms. (Patient not taking: Reported on 02/12/2015) 12 tablet 0  . HYDROcodone-acetaminophen (NORCO) 5-325 MG tablet Take 1 tablet by mouth every 4 (four) hours as needed for moderate pain. (Patient not taking: Reported on 02/12/2015) 20 tablet 0  . predniSONE (STERAPRED UNI-PAK 21 TAB) 10 MG (21) TBPK tablet 6 tablets on day 1, 5 tablets on day 2, 4 tablets on day 3, etc... (Patient not taking: Reported on 02/12/2015) 21 tablet 0  . traMADol (ULTRAM) 50 MG tablet Take 1 tablet (50 mg total) by mouth every 6 (six) hours as needed. (Patient not taking: Reported on 02/12/2015) 12 tablet 0    Review of Systems: GENERAL:  Fatigue.  On/off low grade temperature.  No sweats.  Weight loss of 28 pounds since 12/2014. PERFORMANCE STATUS (  ECOG):  2 HEENT:  Diplopia on right lateral gaze.  No runny nose, sore throat, mouth sores or tenderness. Lungs:  Shortness of breath.  Cough.  No hemoptysis. Cardiac:  Chest/rib pain.  No palpitations, orthopnea, or PND. GI:  Poor appetite.  Nausea.  Constipation.  No vomiting, diarrhea,melena or hematochezia. GU:  No urgency, frequency, dysuria, or hematuria. Musculoskeletal: Diffuse bone pain (ribs, back, legs).  No muscle tenderness. Extremities:  No swelling. Skin:  No rashes or skin changes. Neuro:  No headache, numbness or weakness, balance or coordination issues. Endocrine:  No diabetes, thyroid issues, hot flashes or night sweats. Psych:   No mood changes, depression or anxiety. Pain:  Bone pain. Review of systems:  All other systems reviewed and found to be negative.  Physical Exam:  Blood pressure 135/73, pulse 88, temperature 98.2 F (36.8 C), temperature source Oral, resp. rate 20, height _0  (1.626 m), weight 139 lb 14.4 oz (63.458 kg), SpO2 97 %.  GENERAL:  Chronically fatigued appearing woman sitting comfortably on the medical unit in no acute distress. MENTAL STATUS:  Alert and oriented to person, place and time. HEAD:  Red shoulder length hair with dark roots.  Normocephalic, atraumatic, face symmetric, no Cushingoid features. EYES:  Brown eyes.  Pupils equal round and reactive to light and accomodation.  No conjunctivitis or scleral icterus. ENT:  Oropharynx clear without lesion.  Tongue normal. Mucous membranes moist.  RESPIRATORY:  Wet sounding cough.  Scattered rhonchi. CARDIOVASCULAR:  Regular rate and rhythm without murmur, rub or gallop. BREASTS:  Right breast without masses, skin changes or nipple discharge.  Left breast without masses, skin changes or nipple discharge.  Bilateral tenderness felt associated with rib pain. ABDOMEN:  Soft, non-tender, with active bowel sounds, and no appreciable hepatosplenomegaly.  No masses. SKIN:  Right upper upper back sebaceous cyst.  Subtle nodule left lower anterior chest.  No rashes or ulcers. EXTREMITIES: No edema, no skin discoloration or tenderness.  No palpable cords. LYMPH NODES: Right posterior neck 2 cm mobile node/cyst (old).  No palpable cervical, supraclavicular, axillary or inguinal adenopathy  NEUROLOGICAL: Cranial nerve II-XII intact except for right eye (unable to abduct).  Strength symmetric.  Sensation intact.  Bilateral patellar reflexes 2+.  No clonus or babinski. PSYCH:  Appropriate.  Results for orders placed or performed during the hospital encounter of 02/12/15 (from the past 48 hour(s))  Comprehensive metabolic panel     Status: Abnormal    Collection Time: 02/12/15  3:54 PM  Result Value Ref Range   Sodium 143 135 - 145 mmol/L   Potassium 3.2 (L) 3.5 - 5.1 mmol/L   Chloride 108 101 - 111 mmol/L   CO2 27 22 - 32 mmol/L   Glucose, Bld 114 (H) 65 - 99 mg/dL   BUN 8 6 - 20 mg/dL   Creatinine, Ser 0.69 0.44 - 1.00 mg/dL   Calcium 10.2 8.9 - 10.3 mg/dL   Total Protein 7.0 6.5 - 8.1 g/dL   Albumin 3.5 3.5 - 5.0 g/dL   AST 44 (H) 15 - 41 U/L   ALT 33 14 - 54 U/L   Alkaline Phosphatase 148 (H) 38 - 126 U/L   Total Bilirubin 0.7 0.3 - 1.2 mg/dL   GFR calc non Af Amer >60 >60 mL/min   GFR calc Af Amer >60 >60 mL/min    Comment: (NOTE) The eGFR has been calculated using the CKD EPI equation. This calculation has not been validated in all clinical situations. eGFR's persistently <  60 mL/min signify possible Chronic Kidney Disease.    Anion gap 8 5 - 15  Lactic acid, plasma     Status: None   Collection Time: 02/12/15  3:54 PM  Result Value Ref Range   Lactic Acid, Venous 1.2 0.5 - 2.0 mmol/L  CBC with Differential     Status: Abnormal   Collection Time: 02/12/15  3:54 PM  Result Value Ref Range   WBC 7.2 3.6 - 11.0 K/uL   RBC 4.93 3.80 - 5.20 MIL/uL   Hemoglobin 13.3 12.0 - 16.0 g/dL   HCT 39.6 35.0 - 47.0 %   MCV 80.5 80.0 - 100.0 fL   MCH 27.1 26.0 - 34.0 pg   MCHC 33.6 32.0 - 36.0 g/dL   RDW 17.1 (H) 11.5 - 14.5 %   Platelets 165 150 - 440 K/uL   Neutrophils Relative % 79 %   Neutro Abs 5.7 1.4 - 6.5 K/uL   Lymphocytes Relative 12 %   Lymphs Abs 0.8 (L) 1.0 - 3.6 K/uL   Monocytes Relative 7 %   Monocytes Absolute 0.5 0.2 - 0.9 K/uL   Eosinophils Relative 2 %   Eosinophils Absolute 0.1 0 - 0.7 K/uL   Basophils Relative 0 %   Basophils Absolute 0.0 0 - 0.1 K/uL  Culture, blood (routine x 2)     Status: None (Preliminary result)   Collection Time: 02/12/15  3:54 PM  Result Value Ref Range   Specimen Description BLOOD RIGHT ARM    Special Requests BOTTLES DRAWN AEROBIC AND ANAEROBIC  4CC    Culture NO GROWTH <  24 HOURS    Report Status PENDING   Troponin I     Status: None   Collection Time: 02/12/15  3:54 PM  Result Value Ref Range   Troponin I <0.03 <0.031 ng/mL    Comment:        NO INDICATION OF MYOCARDIAL INJURY.   Urinalysis complete, with microscopic ([facility] only)     Status: Abnormal   Collection Time: 02/12/15  7:03 PM  Result Value Ref Range   Color, Urine YELLOW (A) YELLOW   APPearance CLEAR (A) CLEAR   Glucose, UA NEGATIVE NEGATIVE mg/dL   Bilirubin Urine NEGATIVE NEGATIVE   Ketones, ur NEGATIVE NEGATIVE mg/dL   Specific Gravity, Urine 1.004 (L) 1.005 - 1.030   Hgb urine dipstick NEGATIVE NEGATIVE   pH 6.0 5.0 - 8.0   Protein, ur NEGATIVE NEGATIVE mg/dL   Nitrite NEGATIVE NEGATIVE   Leukocytes, UA NEGATIVE NEGATIVE   RBC / HPF 0-5 0 - 5 RBC/hpf   WBC, UA 0-5 0 - 5 WBC/hpf   Bacteria, UA RARE (A) NONE SEEN   Squamous Epithelial / LPF 0-5 (A) NONE SEEN  Urine culture     Status: None (Preliminary result)   Collection Time: 02/12/15  7:03 PM  Result Value Ref Range   Specimen Description URINE, RANDOM    Special Requests NONE    Culture HOLDING FOR POSSIBLE PATHOGEN    Report Status PENDING   Magnesium     Status: None   Collection Time: 02/13/15  5:40 AM  Result Value Ref Range   Magnesium 2.0 1.7 - 2.4 mg/dL  Phosphorus     Status: None   Collection Time: 02/13/15  5:40 AM  Result Value Ref Range   Phosphorus 3.8 2.5 - 4.6 mg/dL  Basic metabolic panel     Status: Abnormal   Collection Time: 02/13/15  5:40 AM  Result Value Ref  Range   Sodium 145 135 - 145 mmol/L   Potassium 3.6 3.5 - 5.1 mmol/L   Chloride 111 101 - 111 mmol/L   CO2 28 22 - 32 mmol/L   Glucose, Bld 121 (H) 65 - 99 mg/dL   BUN 8 6 - 20 mg/dL   Creatinine, Ser 0.64 0.44 - 1.00 mg/dL   Calcium 9.6 8.9 - 10.3 mg/dL   GFR calc non Af Amer >60 >60 mL/min   GFR calc Af Amer >60 >60 mL/min    Comment: (NOTE) The eGFR has been calculated using the CKD EPI equation. This calculation has not been  validated in all clinical situations. eGFR's persistently <60 mL/min signify possible Chronic Kidney Disease.    Anion gap 6 5 - 15  CBC     Status: Abnormal   Collection Time: 02/13/15  5:40 AM  Result Value Ref Range   WBC 6.0 3.6 - 11.0 K/uL   RBC 4.29 3.80 - 5.20 MIL/uL   Hemoglobin 11.6 (L) 12.0 - 16.0 g/dL   HCT 35.0 35.0 - 47.0 %   MCV 81.6 80.0 - 100.0 fL   MCH 27.1 26.0 - 34.0 pg   MCHC 33.1 32.0 - 36.0 g/dL   RDW 16.9 (H) 11.5 - 14.5 %   Platelets 142 (L) 150 - 440 K/uL   Dg Chest 2 View  02/12/2015  CLINICAL DATA:  Chest pain. EXAM: CHEST  2 VIEW COMPARISON:  02/09/2015. FINDINGS: Mediastinum and hilar structures are normal. Bilateral subsegmental atelectasis and/or pleural parenchymal scarring again noted. No pleural effusion or pneumothorax. Heart size normal. No acute bony abnormality. IMPRESSION: Bilateral pleural-parenchymal atelectasis and are scarring. Chest is stable from prior exam . Electronically Signed   By: Marcello Moores  Register   On: 02/12/2015 16:35   Ct Head Wo Contrast  02/12/2015  ADDENDUM REPORT: 02/12/2015 20:53 ADDENDUM: Upon further examination, there is a small ovoid soft tissue lesion within the medial aspect of the LEFT orbit just superior to the medial rectus muscle (image number 5, series 2). This small lesion is immediately posterior medial to the globe. Exact origin of this lesion and relationship to the intraconal contents cannot be fully evaluated. Recommend MRI of the orbits with contrast for further characterization. Findings conveyed toMARK Odom on 02/12/2015  at20:49. Electronically Signed   By: Suzy Bouchard M.D.   On: 02/12/2015 20:53  02/12/2015  CLINICAL DATA:  Antibiotics for pneumonia.  Double vision. EXAM: CT HEAD WITHOUT CONTRAST TECHNIQUE: Contiguous axial images were obtained from the base of the skull through the vertex without intravenous contrast. COMPARISON:  None available FINDINGS: No acute intracranial hemorrhage. No focal mass  lesion. No CT evidence of acute infarction. No midline shift or mass effect. No hydrocephalus. Basilar cisterns are patent. Mild generalized atrophy. Paranasal sinuses and  mastoid air cells are clear. 13 mm subcutaneous nodule posterior to the RIGHT year. IMPRESSION: 1. No acute intracranial findings. 2. Probable inflammatory lymph node posterior to the RIGHT year. Electronically Signed: By: Suzy Bouchard M.D. On: 02/12/2015 20:35   Ct Chest Wo Contrast  02/12/2015  CLINICAL DATA:  Pt reports seen here Tuesday, dx with pneumonia; has taken 4 doses of antibiotics and reports no improvement. Pt reports chest congestion and shortness of breath. Pt reports double vision out of left eye, reports started Wednesday. EXAM: CT CHEST WITHOUT CONTRAST TECHNIQUE: Multidetector CT imaging of the chest was performed following the standard protocol without IV contrast. COMPARISON:  Chest radiograph 02/12/2015 FINDINGS: Evaluation of vascular structures  and mediastinal structures is limited without IV contrast material. The heart size is normal. There is mild focal pericardial thickening. Calcification of the aorta. Mild ectasia of the ascending thoracic aorta with AP diameter 3.7 cm. Prominent lymph nodes in the mediastinum. Largest lymph node is a a right paratracheal node measuring 2.1 cm in called extrinsic compression on the trachea. Additional mildly enlarged lymph nodes demonstrated throughout the upper mediastinum, pretracheal region, and in the axilla bilaterally. Appearance is nonspecific but could metastatic disease or lymphoma. Esophagus is decompressed. Patchy sub cm ground-glass nodules demonstrated in the right lung base. More solid nodule demonstrated in the left lung base measuring 5 mm. Focal area of irregular scarring in the right middle lung measuring 10 mm diameter. Early bronchogenic carcinoma is not excluded. Scattered emphysematous changes in the lungs. Heterogeneous areas of lucency and sclerosis  throughout the thoracic vertebrae with involvement of vertebral bodies and posterior elements. Focal lesion also demonstrated in the sternum. Multiple expansile right rib lesions. Appearance is consistent with diffuse bone metastasis or possibly myeloma. No focal consolidation.  No pleural effusions.  No pneumothorax. Visualized portions of the upper abdomen demonstrate bilateral adrenal gland nodules, measuring 2.3 cm on the left and 1.9 cm on the right. These are worrisome for metastatic lesions. Nodules in the subcutaneous fat posterior to the right shoulder and anterior to the left lower chest could represent sebaceous cysts or subcutaneous metastases. IMPRESSION: Mixed lucent and sclerotic and expansile lesions demonstrated in the spine, ribs, and sternum likely to represent metastasis or myeloma. Mediastinal lymphadenopathy suggesting metastasis or lymphoma. Bilateral pulmonary nodules with some solid nodules and some patchy ground-glass nodules. Focal spiculation in the right middle lung. New to exclude metastasis or primary lung lesion. Bilateral adrenal gland nodules and skin nodules also likely to represent metastases. Electronically Signed   By: Lucienne Capers M.D.   On: 02/12/2015 19:05    Assessment:  The patient is a 64 y.o.  woman with a greater than 50 pack year smoking history.  Images reveal mixed lucent , sclerotic lesions, and expansile lesions in the spine, ribs, and sternum without clear primary (myeloma versus metastasis).  She has mediastinal adenopathy possibly consistent with lymphoma versus metastatic disease.    Head CT reveals a small ovoid soft tissue lesion within the medial aspect of the left orbit just superior to the medial rectus muscle.  She has had a 28 pound weight loss in the past 2 months secondary to poor appetite and nausea. She has pain associated with bone lesions.  Calcium and creatinine are normal.  Urine reveals no proteinuria.  She has not had a mammogram in  years.  She has never had a colonoscopy.  Plan:   1.  Review films with radiology regarding ideal site for biopsy. 2.  Head MRI with contrast. 3.  Labs:  LFTs, LDH, uric acid, SPEP, free light chains, immunoglobulin levels, CEA. 4.  Collect 24 hour urine for UPEP and free light chains. 5.  Bone scan- assess metastatic disease.  Plain films of weight bearing lesions. 6.  Anticipate abdomen and pelvic CT scan with contrast to fully assess disease/possible primary (await for results of above labs).   Thank you for allowing me to participate in KIMMIE BERGGREN 's care.  I will follow her closely with you while hospitalized and after discharge in the outpatient department.  Lequita Asal, MD  02/13/2015, 3:45 PM

## 2015-02-14 ENCOUNTER — Encounter: Payer: Self-pay | Admitting: Radiology

## 2015-02-14 ENCOUNTER — Inpatient Hospital Stay: Payer: Medicaid Other

## 2015-02-14 DIAGNOSIS — E43 Unspecified severe protein-calorie malnutrition: Secondary | ICD-10-CM | POA: Insufficient documentation

## 2015-02-14 LAB — LACTATE DEHYDROGENASE: LDH: 368 U/L — ABNORMAL HIGH (ref 98–192)

## 2015-02-14 LAB — URINE CULTURE

## 2015-02-14 LAB — URIC ACID: Uric Acid, Serum: 6.1 mg/dL (ref 2.3–6.6)

## 2015-02-14 MED ORDER — IOHEXOL 240 MG/ML SOLN: 50.0000 mL | Freq: Once | INTRAMUSCULAR | Status: DC | PRN

## 2015-02-14 MED ORDER — IOHEXOL 240 MG/ML SOLN
25.0000 mL | INTRAMUSCULAR | Status: AC
  Administered 2015-02-14 (×2): 25 mL via ORAL

## 2015-02-14 MED ORDER — VANCOMYCIN HCL IN DEXTROSE 1-5 GM/200ML-% IV SOLN
1000.0000 mg | INTRAVENOUS | Status: DC
  Filled 2015-02-14: qty 200

## 2015-02-14 MED ORDER — IOHEXOL 300 MG/ML  SOLN
100.0000 mL | Freq: Once | INTRAMUSCULAR | Status: AC | PRN
  Administered 2015-02-14: 12:00:00 100 mL via INTRAVENOUS

## 2015-02-14 MED ORDER — VANCOMYCIN HCL 500 MG IV SOLR
500.0000 mg | Freq: Two times a day (BID) | INTRAVENOUS | Status: DC
  Administered 2015-02-15: 500 mg via INTRAVENOUS
  Filled 2015-02-14 (×3): qty 500

## 2015-02-14 MED ORDER — PEG 3350-KCL-NA BICARB-NACL 420 G PO SOLR
4000.0000 mL | Freq: Once | ORAL | Status: AC
  Administered 2015-02-14: 17:00:00 4000 mL via ORAL
  Filled 2015-02-14 (×3): qty 4000

## 2015-02-14 MED ORDER — METHYLPREDNISOLONE SODIUM SUCC 40 MG IJ SOLR
40.0000 mg | Freq: Two times a day (BID) | INTRAMUSCULAR | Status: DC
  Administered 2015-02-14 – 2015-02-17 (×5): 40 mg via INTRAVENOUS
  Filled 2015-02-14 (×5): qty 1

## 2015-02-14 MED ORDER — VANCOMYCIN HCL IN DEXTROSE 1-5 GM/200ML-% IV SOLN
1000.0000 mg | Freq: Once | INTRAVENOUS | Status: AC
  Administered 2015-02-14: 1000 mg via INTRAVENOUS
  Filled 2015-02-14: qty 200

## 2015-02-14 MED ORDER — HYDROMORPHONE HCL 1 MG/ML IJ SOLN
2.0000 mg | INTRAMUSCULAR | Status: DC | PRN
  Administered 2015-02-14 – 2015-02-17 (×16): 2 mg via INTRAVENOUS
  Filled 2015-02-14 (×16): qty 2

## 2015-02-14 MED ORDER — POLYETHYLENE GLYCOL 3350 17 GM/SCOOP PO POWD
1.0000 | Freq: Once | ORAL | Status: AC
  Administered 2015-02-14: 255 g via ORAL
  Filled 2015-02-14: qty 255

## 2015-02-14 MED ORDER — VANCOMYCIN HCL IN DEXTROSE 750-5 MG/150ML-% IV SOLN
750.0000 mg | Freq: Two times a day (BID) | INTRAVENOUS | Status: DC
  Filled 2015-02-14: qty 150

## 2015-02-14 NOTE — Progress Notes (Signed)
ANTIBIOTIC CONSULT NOTE - INITIAL  Pharmacy Consult for Vancomycin Indication: MRSA in urine  Allergies  Allergen Reactions  . Bee Venom Anaphylaxis  . Naproxen Nausea And Vomiting  . Sulfa Antibiotics Other (See Comments)    Reaction:  Unknown   . Antihistamines, Chlorpheniramine-Type Palpitations    Patient Measurements: Height: '5\' 4"'$  (162.6 cm) Weight: 139 lb 14.4 oz (63.458 kg) IBW/kg (Calculated) : 54.7 Adjusted Body Weight: 58.22kg  Vital Signs: Temp: 98 F (36.7 C) (11/27 0724) Temp Source: Oral (11/27 0724) BP: 136/76 mmHg (11/27 0724) Pulse Rate: 89 (11/27 0724) Intake/Output from previous day: 11/26 0701 - 11/27 0700 In: 240 [P.O.:240] Out: 1900 [Urine:1900] Intake/Output from this shift: Total I/O In: 240 [P.O.:240] Out: 100 [Urine:100]  Labs:  Recent Labs  02/12/15 1554 02/13/15 0540  WBC 7.2 6.0  HGB 13.3 11.6*  PLT 165 142*  CREATININE 0.69 0.64   Estimated Creatinine Clearance: 61.3 mL/min (by C-G formula based on Cr of 0.64). No results for input(s): VANCOTROUGH, VANCOPEAK, VANCORANDOM, GENTTROUGH, GENTPEAK, GENTRANDOM, TOBRATROUGH, TOBRAPEAK, TOBRARND, AMIKACINPEAK, AMIKACINTROU, AMIKACIN in the last 72 hours.   Microbiology: Recent Results (from the past 720 hour(s))  Culture, blood (routine x 2)     Status: None (Preliminary result)   Collection Time: 02/12/15  3:54 PM  Result Value Ref Range Status   Specimen Description BLOOD RIGHT ARM  Final   Special Requests BOTTLES DRAWN AEROBIC AND ANAEROBIC  4CC  Final   Culture NO GROWTH 2 DAYS  Final   Report Status PENDING  Incomplete  Urine culture     Status: None   Collection Time: 02/12/15  7:03 PM  Result Value Ref Range Status   Specimen Description URINE, RANDOM  Final   Special Requests NONE  Final   Culture   Final    40,000 COLONIES/ml METHICILLIN RESISTANT STAPHYLOCOCCUS AUREUS MALKA SINWANY,RN 02/14/2015 1212 BY JRS. MIXED BACTERIAL ORGANISMS    Report Status 02/14/2015  FINAL  Final   Organism ID, Bacteria METHICILLIN RESISTANT STAPHYLOCOCCUS AUREUS  Final      Susceptibility   Methicillin resistant staphylococcus aureus - MIC*    CIPROFLOXACIN >=8 RESISTANT Resistant     ERYTHROMYCIN >=8 RESISTANT Resistant     GENTAMICIN <=0.5 SENSITIVE Sensitive     OXACILLIN >=4 RESISTANT Resistant     VANCOMYCIN 1 SENSITIVE Sensitive     TRIMETH/SULFA <=10 SENSITIVE Sensitive     CLINDAMYCIN <=0.25 RESISTANT Resistant     CEFOXITIN SCREEN Value in next row Resistant      POSITIVECEFOXITIN SCREEN - This test may be used to predict mecA-mediated oxacillin resistance, and it is based on the cefoxitin disk screen test.  The cefoxitin screen and oxacillin work in combination to determine the final interpretation reported for oxacillin.     Inducible Clindamycin Value in next row Resistant      POSITIVEINDUCIBLE CLINDAMYCIN RESISTANCE - A positive ICR test is indicative of inducible resistance to macrolides, lincosamides, and type B streptogramin.  This isolate is presumed to be resistant to Clindamycin, however, Clindamycin may still be effective in some patients.     * 40,000 COLONIES/ml METHICILLIN RESISTANT STAPHYLOCOCCUS AUREUS    Medical History: Past Medical History  Diagnosis Date  . Tobacco abuse   . Muscle spasm   . Cancer (HCC)     Medications:  Scheduled:  . enoxaparin (LOVENOX) injection  40 mg Subcutaneous Q24H  . feeding supplement (ENSURE ENLIVE)  237 mL Oral TID  . methylPREDNISolone (SOLU-MEDROL) injection  40 mg Intravenous  Q12H  . nicotine  21 mg Transdermal Daily  . vancomycin  1,000 mg Intravenous Once  . vancomycin  750 mg Intravenous Q12H   Assessment: CG is a 64 yo female  found to have severe metastatic disease and UCx + for MRSA.  Pharmacy consulted to dose vancomycin in this patient.  Goal of Therapy:  Vancomycin trough level 10-15 mcg/ml  Plan:  Patient received Vancomycin 1g IV x1 at 1400 on 11/27.  Initiate Vancomycin '500mg'$   IV q12hrs starting 12 hours after 1st dose for stacked dosing.  Ke= 0.055 hr-1 T1/2= 12.6 hr Vd= 44.8 L  Cmax= 20.68 Cmin= 11.29  Will draw trough on 11/20 at 0130 prior to 4th dose.  Pharmacy will continue to monitor.  Roe Coombs, PharmD Pharmacy Resident 02/14/2015 2:06 PM

## 2015-02-14 NOTE — Progress Notes (Signed)
Problem: Safety: Goal: Ability to remain free from injury will improve Outcome: Progressing Patient is a HIGH Fall risk One assist to the bathroom  Problem: Pain Managment: Goal: General experience of comfort will improve Patient remians tearful about new diagnosis, gave emotional support New order for PRN Dilaudid for pain obtained, pain improved with this Also given PRN Norco in between Dilaudid.

## 2015-02-14 NOTE — Plan of Care (Signed)
Problem: Safety: Goal: Ability to remain free from injury will improve Outcome: Progressing Patient remains high fall risk, bed alarm in place patient calls for assistance, one time assist to BR  Problem: Pain Managment: Goal: General experience of comfort will improve Patient remians tearful about new diagnosis, spent time with patient explaining how treatments and tests help doctors determine best course of action, PRN valium, morphine and norco given witn minimal  Effect, pt very scared she is going to die and just wants to get well enough to go home and see her family.

## 2015-02-14 NOTE — Progress Notes (Addendum)
[facility] NAME: Brandy Odom    MR#:  [id number]  DATE OF BIRTH:  04-09-1950  SUBJECTIVE: Admitted for chronic cough, persistent shortness of breath. Noted to have a weight loss of 20 pounds in last 3 months. 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 Because of back pain and chest pain also complains of a lot of anxiety about the workup for cancer.    CHIEF COMPLAINT:   Chief Complaint  Patient presents with  . Shortness of Breath  . Diplopia    REVIEW OF SYSTEMS:   Review of Systems  Constitutional: Negative for fever and chills.  HENT: Negative for hearing loss.   Eyes: Negative for blurred vision, double vision and photophobia.  Respiratory: Positive for cough and shortness of breath. Negative for hemoptysis.   Cardiovascular: Negative for palpitations, orthopnea and leg swelling.  Gastrointestinal: Negative for vomiting, abdominal pain and diarrhea.  Genitourinary: Negative for dysuria and urgency.  Musculoskeletal: Negative for myalgias and neck pain.       Upper and lower back pain, chest pain all over the chest.  Skin: Negative for rash.  Neurological: Negative for dizziness, focal weakness, seizures, weakness and headaches.  Psychiatric/Behavioral: Negative for memory loss. The patient does not have insomnia.      DRUG ALLERGIES:   Allergies  Allergen Reactions  . Bee Venom Anaphylaxis  . Naproxen Nausea And Vomiting  . Sulfa Antibiotics Other (See Comments)    Reaction:  Unknown   . Antihistamines, Chlorpheniramine-Type Palpitations    VITALS:  Blood pressure 136/76, pulse 89, temperature 98 F (36.7 C), temperature source Oral, resp. rate 20, height '5\' 4"'$  (1.626 m), weight 63.458 kg (139 lb 14.4 oz), SpO2 92 %.  PHYSICAL EXAMINATION:  GENERAL:  64 y.o.-year-old patient lying in the bed with no acute distress.  EYES: Pupils equal, round, reactive to light and accommodation. No scleral icterus.  Extraocular muscles intact.  HEENT: Head atraumatic, normocephalic. Oropharynx and nasopharynx clear.  NECK:  Supple, no jugular venous distention. No thyroid enlargement, no tenderness.  LUNGS: Normal breath sounds bilaterally, faint  wheezing present, rales,rhonchi or crepitation. No use of accessory muscles of respiration.  CARDIOVASCULAR: S1, S2 normal. No murmurs, rubs, or gallops.  ABDOMEN: Soft, nontender, nondistended. Bowel sounds present. No organomegaly or mass.  EXTREMITIES: No pedal edema, cyanosis, or clubbing.  NEUROLOGIC: Cranial nerves II through XII are intact. Muscle strength 5/5 in all extremities. Sensation intact. Gait not checked.  PSYCHIATRIC: The patient is alert and oriented x 3.  SKIN: No obvious rash, lesion, or ulcer.    LABORATORY PANEL:   CBC  Recent Labs Lab 02/13/15 0540  WBC 6.0  HGB 11.6*  HCT 35.0  PLT 142*   ------------------------------------------------------------------------------------------------------------------  Chemistries   Recent Labs Lab 02/13/15 0540  NA 145  K 3.6  CL 111  CO2 28  GLUCOSE 121*  BUN 8  CREATININE 0.64  CALCIUM 9.6  MG 2.0  AST 37  ALT 29  ALKPHOS 125  BILITOT 0.5   ------------------------------------------------------------------------------------------------------------------  Cardiac Enzymes  Recent Labs Lab 02/12/15 1554  TROPONINI <0.03   ------------------------------------------------------------------------------------------------------------------  RADIOLOGY:  Dg Chest 2 View  02/12/2015  CLINICAL DATA:  Chest pain. EXAM: CHEST  2 VIEW COMPARISON:  02/09/2015. FINDINGS: Mediastinum and hilar structures are normal. Bilateral subsegmental atelectasis and/or pleural parenchymal scarring again noted. No pleural effusion or pneumothorax. Heart size normal. No acute bony abnormality. IMPRESSION: Bilateral pleural-parenchymal atelectasis and are scarring. Chest is stable  from prior exam .  Electronically Signed   By: Marcello Moores  Register   On: 02/12/2015 16:35   Ct Head Wo Contrast  02/12/2015  ADDENDUM REPORT: 02/12/2015 20:53 ADDENDUM: Upon further examination, there is a small ovoid soft tissue lesion within the medial aspect of the LEFT orbit just superior to the medial rectus muscle (image number 5, series 2). This small lesion is immediately posterior medial to the globe. Exact origin of this lesion and relationship to the intraconal contents cannot be fully evaluated. Recommend MRI of the orbits with contrast for further characterization. Findings conveyed toMARK QUALE on 02/12/2015  at20:49. Electronically Signed   By: Suzy Bouchard M.D.   On: 02/12/2015 20:53  02/12/2015  CLINICAL DATA:  Antibiotics for pneumonia.  Double vision. EXAM: CT HEAD WITHOUT CONTRAST TECHNIQUE: Contiguous axial images were obtained from the base of the skull through the vertex without intravenous contrast. COMPARISON:  None available FINDINGS: No acute intracranial hemorrhage. No focal mass lesion. No CT evidence of acute infarction. No midline shift or mass effect. No hydrocephalus. Basilar cisterns are patent. Mild generalized atrophy. Paranasal sinuses and  mastoid air cells are clear. 13 mm subcutaneous nodule posterior to the RIGHT year. IMPRESSION: 1. No acute intracranial findings. 2. Probable inflammatory lymph node posterior to the RIGHT year. Electronically Signed: By: Suzy Bouchard M.D. On: 02/12/2015 20:35   Ct Chest Wo Contrast  02/12/2015  CLINICAL DATA:  Pt reports seen here Tuesday, dx with pneumonia; has taken 4 doses of antibiotics and reports no improvement. Pt reports chest congestion and shortness of breath. Pt reports double vision out of left eye, reports started Wednesday. EXAM: CT CHEST WITHOUT CONTRAST TECHNIQUE: Multidetector CT imaging of the chest was performed following the standard protocol without IV contrast. COMPARISON:  Chest radiograph 02/12/2015 FINDINGS: Evaluation  of vascular structures and mediastinal structures is limited without IV contrast material. The heart size is normal. There is mild focal pericardial thickening. Calcification of the aorta. Mild ectasia of the ascending thoracic aorta with AP diameter 3.7 cm. Prominent lymph nodes in the mediastinum. Largest lymph node is a a right paratracheal node measuring 2.1 cm in called extrinsic compression on the trachea. Additional mildly enlarged lymph nodes demonstrated throughout the upper mediastinum, pretracheal region, and in the axilla bilaterally. Appearance is nonspecific but could metastatic disease or lymphoma. Esophagus is decompressed. Patchy sub cm ground-glass nodules demonstrated in the right lung base. More solid nodule demonstrated in the left lung base measuring 5 mm. Focal area of irregular scarring in the right middle lung measuring 10 mm diameter. Early bronchogenic carcinoma is not excluded. Scattered emphysematous changes in the lungs. Heterogeneous areas of lucency and sclerosis throughout the thoracic vertebrae with involvement of vertebral bodies and posterior elements. Focal lesion also demonstrated in the sternum. Multiple expansile right rib lesions. Appearance is consistent with diffuse bone metastasis or possibly myeloma. No focal consolidation.  No pleural effusions.  No pneumothorax. Visualized portions of the upper abdomen demonstrate bilateral adrenal gland nodules, measuring 2.3 cm on the left and 1.9 cm on the right. These are worrisome for metastatic lesions. Nodules in the subcutaneous fat posterior to the right shoulder and anterior to the left lower chest could represent sebaceous cysts or subcutaneous metastases. IMPRESSION: Mixed lucent and sclerotic and expansile lesions demonstrated in the spine, ribs, and sternum likely to represent metastasis or myeloma. Mediastinal lymphadenopathy suggesting metastasis or lymphoma. Bilateral pulmonary nodules with some solid nodules and some  patchy ground-glass nodules. Focal spiculation in the  right middle lung. New to exclude metastasis or primary lung lesion. Bilateral adrenal gland nodules and skin nodules also likely to represent metastases. Electronically Signed   By: Lucienne Capers M.D.   On: 02/12/2015 19:05    EKG:   Orders placed or performed during the hospital encounter of 02/12/15  . ED EKG  . ED EKG    ASSESSMENT AND PLAN:   r 1 shortness of breath and hypoxia was multifactorial secondary to COPD exacerbation, malignancy. Her COPD exacerbation she is on oxygen, Solu-Medrol, nebulizers. Decrease Steroids. #2 shortness of breath and cough and weight loss secondary to mediastinal lymphadenopathy with possible lymphoma versus myeloma. Due to her extensive smoking history unable to exclude primary lung lesion. 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 Her chest pain and back pain are likely related to her metastasis. She is requesting Valium for anxiety. Continue Valium, continue Percocet for pain control. Also IV Dilaudid for continued pain control. We will evaluate for fentanyl patch.  Scheduled to have MRI of the brain with contrast, PET scan.. Also abdominal CT with contrast to fully assess the disease after the above results are availableLabs: LFTs, LDH, uric acid, SPEP, free light chains, immunoglobulin levels, CEA.  Collect 24 hour urine for UPEP and free light chains. #3 Severe malnutrition  in the context of chronic illness; continue ensure enlive  All the records are reviewed and case discussed with Care Management/Social Workerr. Management plans discussed with the patient, family and they are in agreement.  CODE STATUS: full  TOTAL TIME TAKING CARE OF THIS PATIENT: 35 minutes.   POSSIBLE D/C IN 1-2DAYS, DEPENDING ON CLINICAL CONDITION.   Epifanio Lesches M.D on 02/14/2015 at 9:33 AM  Between 7am to 6pm - Pager - [phone]  After 6pm go to [email or web address] - password [email or web address]  [facility]  [facility]  Office  [phone]  CC: Primary care physician; No PCP Per Patient   Note: This dictation was prepared with Dragon dictation along with smaller phrase technology. Any transcriptional errors that result from this process are unintentional.

## 2015-02-14 NOTE — Progress Notes (Signed)
[facility]  Date of admission:  02/12/2015  Inpatient day:  02/14/2015  Consulting physician:  Dr. Lance Coon   Chief Complaint: Brandy Odom is a 64 y.o. female with a greater than 50 pack year smoking history who was admitted through the emergency room with shortness of breath and imaging studies revealing metastatic disease.  Subjective:  Anxious.  Diffuse bone pain.  Past Medical History  Diagnosis Date  . Tobacco abuse   . Muscle spasm   . Cancer [facility])     Past Surgical History  Procedure Laterality Date  . Appendectomy      Family History  Problem Relation Age of Onset  . Colon cancer      Social History:  reports that she has been smoking Cigarettes.  She does not have any smokeless tobacco history on file. She reports that she does not drink alcohol or use illicit drugs.  She lives in [place] with her husband, Brandy Odom,  who accompanies her today.  Allergies:  Allergies  Allergen Reactions  . Bee Venom Anaphylaxis  . Naproxen Nausea And Vomiting  . Sulfa Antibiotics Other (See Comments)    Reaction:  Unknown   . Antihistamines, Chlorpheniramine-Type Palpitations    Medications Prior to Admission  Medication Sig Dispense Refill  . azithromycin (ZITHROMAX) 250 MG tablet Take 250-500 mg by mouth daily. Pt is to take two tablets on day 1 and one tablet for the next four days.    . diazepam (VALIUM) 5 MG tablet Take 1 tablet (5 mg total) by mouth every 8 (eight) hours as needed for muscle spasms. (Patient not taking: Reported on 02/12/2015) 12 tablet 0  . HYDROcodone-acetaminophen (NORCO) 5-325 MG tablet Take 1 tablet by mouth every 4 (four) hours as needed for moderate pain. (Patient not taking: Reported on 02/12/2015) 20 tablet 0  . predniSONE (STERAPRED UNI-PAK 21 TAB) 10 MG (21) TBPK tablet 6 tablets on day 1, 5 tablets on day 2, 4 tablets on day 3, etc... (Patient not taking: Reported on 02/12/2015) 21 tablet 0  . traMADol (ULTRAM) 50 MG  tablet Take 1 tablet (50 mg total) by mouth every 6 (six) hours as needed. (Patient not taking: Reported on 02/12/2015) 12 tablet 0    Review of Systems: GENERAL:  Fatigue.  On/off low grade temperature.  No sweats.  Weight loss of 28 pounds since 12/2014. PERFORMANCE STATUS (ECOG):  2 HEENT:  Diplopia on right lateral gaze.  No runny nose, sore throat, mouth sores or tenderness. Lungs:  Shortness of breath.  Cough.  No hemoptysis. Cardiac:  Chest/rib pain (stable).  No palpitations, orthopnea, or PND. GI:  Poor appetite.  Nausea.  Constipation.  No vomiting, diarrhea,melena or hematochezia. GU:  No urgency, frequency, dysuria, or hematuria. Musculoskeletal: Diffuse bone pain (ribs, back, legs).  No muscle tenderness. Extremities:  No swelling. Skin:  No rashes or skin changes. Neuro:  No headache, numbness or weakness, balance or coordination issues. Endocrine:  No diabetes, thyroid issues, hot flashes or night sweats. Psych:  Anxiety. Pain:  Bone pain. Review of systems:  All other systems reviewed and found to be negative.  Physical Exam:  Blood pressure 136/76, pulse 89, temperature 98 F (36.7 C), temperature source Oral, resp. rate 20, height _0  (1.626 m), weight 139 lb 14.4 oz (63.458 kg), SpO2 92 %.  GENERAL:  Chronically fatigued appearing woman sitting comfortably on the medical unit in no acute distress. MENTAL STATUS:  Alert and oriented to person, place and  time. HEAD:  Red shoulder length hair with dark roots.  Normocephalic, atraumatic, face symmetric, no Cushingoid features. EYES:  Brown eyes.  False eyelashes.  Pupils equal round and reactive to light and accomodation.  No conjunctivitis or scleral icterus. ENT:  Oropharynx clear without lesion.  Tongue normal. Mucous membranes moist.  RESPIRATORY:  Scattered rhonchi.  No wheezes. CARDIOVASCULAR:  Regular rate and rhythm without murmur, rub or gallop. ABDOMEN:  Soft, non-tender, with active bowel sounds, and no  appreciable hepatosplenomegaly.  No masses. SKIN:  Right upper upper back sebaceous cyst.  No rashes or ulcers. EXTREMITIES: No edema, no skin discoloration or tenderness.  No palpable cords. LYMPH NODES: Right posterior neck 2 cm mobile cyst (old).  No palpable cervical, supraclavicular, axillary or inguinal adenopathy  NEUROLOGICAL: Cranial nerve II-XII intact except for right eye (unable to abduct).  Strength symmetric.  Sensation intact.  PSYCH:  Appropriate.  Results for orders placed or performed during the hospital encounter of 02/12/15 (from the past 48 hour(s))  Comprehensive metabolic panel     Status: Abnormal   Collection Time: 02/12/15  3:54 PM  Result Value Ref Range   Sodium 143 135 - 145 mmol/L   Potassium 3.2 (L) 3.5 - 5.1 mmol/L   Chloride 108 101 - 111 mmol/L   CO2 27 22 - 32 mmol/L   Glucose, Bld 114 (H) 65 - 99 mg/dL   BUN 8 6 - 20 mg/dL   Creatinine, Ser 0.69 0.44 - 1.00 mg/dL   Calcium 10.2 8.9 - 10.3 mg/dL   Total Protein 7.0 6.5 - 8.1 g/dL   Albumin 3.5 3.5 - 5.0 g/dL   AST 44 (H) 15 - 41 U/L   ALT 33 14 - 54 U/L   Alkaline Phosphatase 148 (H) 38 - 126 U/L   Total Bilirubin 0.7 0.3 - 1.2 mg/dL   GFR calc non Af Amer >60 >60 mL/min   GFR calc Af Amer >60 >60 mL/min    Comment: (NOTE) The eGFR has been calculated using the CKD EPI equation. This calculation has not been validated in all clinical situations. eGFR's persistently <60 mL/min signify possible Chronic Kidney Disease.    Anion gap 8 5 - 15  Lactic acid, plasma     Status: None   Collection Time: 02/12/15  3:54 PM  Result Value Ref Range   Lactic Acid, Venous 1.2 0.5 - 2.0 mmol/L  CBC with Differential     Status: Abnormal   Collection Time: 02/12/15  3:54 PM  Result Value Ref Range   WBC 7.2 3.6 - 11.0 K/uL   RBC 4.93 3.80 - 5.20 MIL/uL   Hemoglobin 13.3 12.0 - 16.0 g/dL   HCT 39.6 35.0 - 47.0 %   MCV 80.5 80.0 - 100.0 fL   MCH 27.1 26.0 - 34.0 pg   MCHC 33.6 32.0 - 36.0 g/dL   RDW 17.1  (H) 11.5 - 14.5 %   Platelets 165 150 - 440 K/uL   Neutrophils Relative % 79 %   Neutro Abs 5.7 1.4 - 6.5 K/uL   Lymphocytes Relative 12 %   Lymphs Abs 0.8 (L) 1.0 - 3.6 K/uL   Monocytes Relative 7 %   Monocytes Absolute 0.5 0.2 - 0.9 K/uL   Eosinophils Relative 2 %   Eosinophils Absolute 0.1 0 - 0.7 K/uL   Basophils Relative 0 %   Basophils Absolute 0.0 0 - 0.1 K/uL  Culture, blood (routine x 2)     Status: None (Preliminary result)  Collection Time: 02/12/15  3:54 PM  Result Value Ref Range   Specimen Description BLOOD RIGHT ARM    Special Requests BOTTLES DRAWN AEROBIC AND ANAEROBIC  4CC    Culture NO GROWTH 2 DAYS    Report Status PENDING   Troponin I     Status: None   Collection Time: 02/12/15  3:54 PM  Result Value Ref Range   Troponin I <0.03 <0.031 ng/mL    Comment:        NO INDICATION OF MYOCARDIAL INJURY.   Urinalysis complete, with microscopic ([facility] only)     Status: Abnormal   Collection Time: 02/12/15  7:03 PM  Result Value Ref Range   Color, Urine YELLOW (A) YELLOW   APPearance CLEAR (A) CLEAR   Glucose, UA NEGATIVE NEGATIVE mg/dL   Bilirubin Urine NEGATIVE NEGATIVE   Ketones, ur NEGATIVE NEGATIVE mg/dL   Specific Gravity, Urine 1.004 (L) 1.005 - 1.030   Hgb urine dipstick NEGATIVE NEGATIVE   pH 6.0 5.0 - 8.0   Protein, ur NEGATIVE NEGATIVE mg/dL   Nitrite NEGATIVE NEGATIVE   Leukocytes, UA NEGATIVE NEGATIVE   RBC / HPF 0-5 0 - 5 RBC/hpf   WBC, UA 0-5 0 - 5 WBC/hpf   Bacteria, UA RARE (A) NONE SEEN   Squamous Epithelial / LPF 0-5 (A) NONE SEEN  Urine culture     Status: None   Collection Time: 02/12/15  7:03 PM  Result Value Ref Range   Specimen Description URINE, RANDOM    Special Requests NONE    Culture      40,000 COLONIES/ml METHICILLIN RESISTANT STAPHYLOCOCCUS AUREUS MALKA SINWANY,RN 02/14/2015 1212 BY JRS. MIXED BACTERIAL ORGANISMS    Report Status 02/14/2015 FINAL    Organism ID, Bacteria METHICILLIN RESISTANT STAPHYLOCOCCUS AUREUS        Susceptibility   Methicillin resistant staphylococcus aureus - MIC*    CIPROFLOXACIN >=8 RESISTANT Resistant     ERYTHROMYCIN >=8 RESISTANT Resistant     GENTAMICIN <=0.5 SENSITIVE Sensitive     OXACILLIN >=4 RESISTANT Resistant     VANCOMYCIN 1 SENSITIVE Sensitive     TRIMETH/SULFA <=10 SENSITIVE Sensitive     CLINDAMYCIN <=0.25 RESISTANT Resistant     CEFOXITIN SCREEN Value in next row Resistant      POSITIVECEFOXITIN SCREEN - This test may be used to predict mecA-mediated oxacillin resistance, and it is based on the cefoxitin disk screen test.  The cefoxitin screen and oxacillin work in combination to determine the final interpretation reported for oxacillin.     Inducible Clindamycin Value in next row Resistant      POSITIVEINDUCIBLE CLINDAMYCIN RESISTANCE - A positive ICR test is indicative of inducible resistance to macrolides, lincosamides, and type B streptogramin.  This isolate is presumed to be resistant to Clindamycin, however, Clindamycin may still be effective in some patients.     * 40,000 COLONIES/ml METHICILLIN RESISTANT STAPHYLOCOCCUS AUREUS  Magnesium     Status: None   Collection Time: 02/13/15  5:40 AM  Result Value Ref Range   Magnesium 2.0 1.7 - 2.4 mg/dL  Phosphorus     Status: None   Collection Time: 02/13/15  5:40 AM  Result Value Ref Range   Phosphorus 3.8 2.5 - 4.6 mg/dL  Basic metabolic panel     Status: Abnormal   Collection Time: 02/13/15  5:40 AM  Result Value Ref Range   Sodium 145 135 - 145 mmol/L   Potassium 3.6 3.5 - 5.1 mmol/L   Chloride 111 101 -  111 mmol/L   CO2 28 22 - 32 mmol/L   Glucose, Bld 121 (H) 65 - 99 mg/dL   BUN 8 6 - 20 mg/dL   Creatinine, Ser 0.64 0.44 - 1.00 mg/dL   Calcium 9.6 8.9 - 10.3 mg/dL   GFR calc non Af Amer >60 >60 mL/min   GFR calc Af Amer >60 >60 mL/min    Comment: (NOTE) The eGFR has been calculated using the CKD EPI equation. This calculation has not been validated in all clinical situations. eGFR's persistently  <60 mL/min signify possible Chronic Kidney Disease.    Anion gap 6 5 - 15  CBC     Status: Abnormal   Collection Time: 02/13/15  5:40 AM  Result Value Ref Range   WBC 6.0 3.6 - 11.0 K/uL   RBC 4.29 3.80 - 5.20 MIL/uL   Hemoglobin 11.6 (L) 12.0 - 16.0 g/dL   HCT 35.0 35.0 - 47.0 %   MCV 81.6 80.0 - 100.0 fL   MCH 27.1 26.0 - 34.0 pg   MCHC 33.1 32.0 - 36.0 g/dL   RDW 16.9 (H) 11.5 - 14.5 %   Platelets 142 (L) 150 - 440 K/uL  Hepatic function panel     Status: Abnormal   Collection Time: 02/13/15  5:40 AM  Result Value Ref Range   Total Protein 6.0 (L) 6.5 - 8.1 g/dL   Albumin 3.0 (L) 3.5 - 5.0 g/dL   AST 37 15 - 41 U/L   ALT 29 14 - 54 U/L   Alkaline Phosphatase 125 38 - 126 U/L   Total Bilirubin 0.5 0.3 - 1.2 mg/dL   Bilirubin, Direct 0.1 0.1 - 0.5 mg/dL   Indirect Bilirubin 0.4 0.3 - 0.9 mg/dL  Lactate dehydrogenase     Status: Abnormal   Collection Time: 02/14/15  6:39 AM  Result Value Ref Range   LDH 368 (H) 98 - 192 U/L  Uric acid     Status: None   Collection Time: 02/14/15  6:39 AM  Result Value Ref Range   Uric Acid, Serum 6.1 2.3 - 6.6 mg/dL   Dg Chest 2 View  02/12/2015  CLINICAL DATA:  Chest pain. EXAM: CHEST  2 VIEW COMPARISON:  02/09/2015. FINDINGS: Mediastinum and hilar structures are normal. Bilateral subsegmental atelectasis and/or pleural parenchymal scarring again noted. No pleural effusion or pneumothorax. Heart size normal. No acute bony abnormality. IMPRESSION: Bilateral pleural-parenchymal atelectasis and are scarring. Chest is stable from prior exam . Electronically Signed   By: Marcello Moores  Register   On: 02/12/2015 16:35   Ct Head Wo Contrast  02/12/2015  ADDENDUM REPORT: 02/12/2015 20:53 ADDENDUM: Upon further examination, there is a small ovoid soft tissue lesion within the medial aspect of the LEFT orbit just superior to the medial rectus muscle (image number 5, series 2). This small lesion is immediately posterior medial to the globe. Exact origin of  this lesion and relationship to the intraconal contents cannot be fully evaluated. Recommend MRI of the orbits with contrast for further characterization. Findings conveyed toMARK QUALE on 02/12/2015  at20:49. Electronically Signed   By: Suzy Bouchard M.D.   On: 02/12/2015 20:53  02/12/2015  CLINICAL DATA:  Antibiotics for pneumonia.  Double vision. EXAM: CT HEAD WITHOUT CONTRAST TECHNIQUE: Contiguous axial images were obtained from the base of the skull through the vertex without intravenous contrast. COMPARISON:  None available FINDINGS: No acute intracranial hemorrhage. No focal mass lesion. No CT evidence of acute infarction. No midline shift or mass  effect. No hydrocephalus. Basilar cisterns are patent. Mild generalized atrophy. Paranasal sinuses and  mastoid air cells are clear. 13 mm subcutaneous nodule posterior to the RIGHT year. IMPRESSION: 1. No acute intracranial findings. 2. Probable inflammatory lymph node posterior to the RIGHT year. Electronically Signed: By: Suzy Bouchard M.D. On: 02/12/2015 20:35   Ct Chest Wo Contrast  02/12/2015  CLINICAL DATA:  Pt reports seen here Tuesday, dx with pneumonia; has taken 4 doses of antibiotics and reports no improvement. Pt reports chest congestion and shortness of breath. Pt reports double vision out of left eye, reports started Wednesday. EXAM: CT CHEST WITHOUT CONTRAST TECHNIQUE: Multidetector CT imaging of the chest was performed following the standard protocol without IV contrast. COMPARISON:  Chest radiograph 02/12/2015 FINDINGS: Evaluation of vascular structures and mediastinal structures is limited without IV contrast material. The heart size is normal. There is mild focal pericardial thickening. Calcification of the aorta. Mild ectasia of the ascending thoracic aorta with AP diameter 3.7 cm. Prominent lymph nodes in the mediastinum. Largest lymph node is a a right paratracheal node measuring 2.1 cm in called extrinsic compression on the  trachea. Additional mildly enlarged lymph nodes demonstrated throughout the upper mediastinum, pretracheal region, and in the axilla bilaterally. Appearance is nonspecific but could metastatic disease or lymphoma. Esophagus is decompressed. Patchy sub cm ground-glass nodules demonstrated in the right lung base. More solid nodule demonstrated in the left lung base measuring 5 mm. Focal area of irregular scarring in the right middle lung measuring 10 mm diameter. Early bronchogenic carcinoma is not excluded. Scattered emphysematous changes in the lungs. Heterogeneous areas of lucency and sclerosis throughout the thoracic vertebrae with involvement of vertebral bodies and posterior elements. Focal lesion also demonstrated in the sternum. Multiple expansile right rib lesions. Appearance is consistent with diffuse bone metastasis or possibly myeloma. No focal consolidation.  No pleural effusions.  No pneumothorax. Visualized portions of the upper abdomen demonstrate bilateral adrenal gland nodules, measuring 2.3 cm on the left and 1.9 cm on the right. These are worrisome for metastatic lesions. Nodules in the subcutaneous fat posterior to the right shoulder and anterior to the left lower chest could represent sebaceous cysts or subcutaneous metastases. IMPRESSION: Mixed lucent and sclerotic and expansile lesions demonstrated in the spine, ribs, and sternum likely to represent metastasis or myeloma. Mediastinal lymphadenopathy suggesting metastasis or lymphoma. Bilateral pulmonary nodules with some solid nodules and some patchy ground-glass nodules. Focal spiculation in the right middle lung. New to exclude metastasis or primary lung lesion. Bilateral adrenal gland nodules and skin nodules also likely to represent metastases. Electronically Signed   By: Lucienne Capers M.D.   On: 02/12/2015 19:05   Ct Abdomen Pelvis W Contrast  02/14/2015  CLINICAL DATA:  64 year old female with recently discrete overt bone lesions  concerning for potential multiple myeloma versus metastatic disease. Mediastinal adenopathy, concerning for potential lymphoma versus metastatic disease. Further evaluation to search for potential primary malignancy. EXAM: CT ABDOMEN AND PELVIS WITH CONTRAST TECHNIQUE: Multidetector CT imaging of the abdomen and pelvis was performed using the standard protocol following bolus administration of intravenous contrast. CONTRAST:  127m OMNIPAQUE IOHEXOL 300 MG/ML  SOLN COMPARISON:  Chest CT 02/12/2015. CT of the abdomen and pelvis 04/27/2009 not available for comparison (could not be retrieved from [employer]. CT the abdomen and pelvis 12/04/2006 is available for comparison. FINDINGS: Lower chest: Previously noted 8 x 5 mm nodule in the periphery of the left lower lobe (image 7 of series 4) is unchanged compared  to the recent prior chest CT. Hepatobiliary: 1.6 x 1.2 cm hypovascular lesion in segment 5 of the liver (image 33 of series 2) adjacent to the gallbladder fossa is indeterminate. No intra or extrahepatic biliary ductal dilatation. Gallbladder is normal in appearance. Pancreas: No pancreatic mass. No pancreatic ductal dilatation. No pancreatic or peripancreatic fluid or inflammatory changes. Spleen: Sub cm low-attenuation lesion in the superior aspect of the spleen is too small to definitively characterize, but is favored to represent a small cyst. Adrenals/Urinary Tract: Bilateral adrenal nodules are noted, measuring 1.4 x 2.2 cm on the right and 2.0 x 2.5 cm on the left, highly concerning for metastatic lesions. Sub cm low-attenuation lesions in both kidneys are too small to definitively characterize, but favored to represent cysts. Exophytic 8 mm high attenuation lesion (63 HU on portal venous phase imaging and 64 HU on delayed imaging) is incompletely characterized, but favored to represent a proteinaceous/hemorrhagic cyst. No hydroureteronephrosis. Urinary bladder is normal in appearance. Stomach/Bowel:  The appearance of the stomach is normal. No pathologic dilatation of small bowel or colon. In the medial aspect of the cecum there is mass-like soft tissue thickening, best appreciated on axial image 49 of series 2 and coronal image 61 of series 5, measuring approximately 3.7 x 3.2 x 3.7 cm. There is haziness in the surrounding pericolonic fat, and adjacent lymphadenopathy which appears contiguous with this apparent colonic mass. The largest lymph node is in the ileocolic mesentery on axial image 44 of series 2 and coronal image 58 of series 5, measuring 2.4 x 2.9 x 3.8 cm. Normal appendix. Vascular/Lymphatic: Atherosclerosis throughout the abdominal and pelvic vasculature, without evidence of aneurysm or dissection. As discussed above there is significant ileocolic lymphadenopathy. Multiple prominent borderline enlarged retroperitoneal lymph nodes are noted, measuring up to 7 mm in short axis immediately anterior to the lower pole of the right kidney. Mildly enlarged lymph node adjacent to the superior mesenteric vein (image 36 of series 2) measuring 1 cm in short axis. Multiple other borderline enlarged mesenteric lymph nodes are also noted, which are conspicuous but nonspecific. Reproductive: Uterus is unremarkable in appearance. Multiple well-defined low to intermediate attenuation lesions in the ovaries bilaterally (right greater than left), measuring up to 2.4 cm in diameter in the inferior aspect of the right ovary, unusual in a postmenopausal patient. Coarse calcification in the left ovary anteriorly. Other: Trace volume of ascites in the low anatomic pelvis. No pneumoperitoneum. Musculoskeletal: Numerous mixed lytic and sclerotic lesions are noted throughout the visualized axial and appendicular skeleton, concerning for widespread metastatic disease to the bones. The largest of these involves the superior endplate of L4 on the right side measuring 3.0 x 1.8 cm (image 39 of series 2) where there is a  pathologic compression with approximately 25% loss of height focally. IMPRESSION: 1. 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 2. In addition, there are multiple low to intermediate attenuation cystic appearing lesions in the ovaries bilaterally. This appearance is unusual in a postmenopausal female. Further evaluation with nonemergent pelvic ultrasound is suggested in the near future sure further characterization. This recommendation follows ACR consensus guidelines: White Paper of the ACR Incidental Findings Committee II on Adnexal Findings. J  Am Coll Radiol 2013:10:675-681. 3. Extensive atherosclerosis. Electronically Signed   By: Vinnie Langton M.D.   On: 02/14/2015 12:18    Assessment:  The patient is a 64 y.o.  woman with a greater than 50 pack year smoking history.  Images reveal mixed lucent , sclerotic lesions, and expansile lesions in the spine, ribs, and sternum without clear primary (myeloma versus metastasis).  She has mediastinal adenopathy possibly consistent with lymphoma versus metastatic disease.    Abdominal/pelvic CT scan on 02/14/2015 are worrisome for a primary colonic neoplasm in the cecum with associated ileocolic lymphadenopathy and metastatic disease to adrenals bilaterally as well as a suspicious lesion in segment 5 of the liver.  There is widespread osseous metastasis.  There are multiple low to intermediate attenuation cystic appearing lesions in the ovaries.  Head CT reveals a small ovoid soft tissue lesion within the medial aspect of the left orbit just superior to the medial rectus  muscle.  She has had a 28 pound weight loss in the past 2 months secondary to poor appetite and nausea. She has pain associated with bone lesions.  Calcium and creatinine are normal.  Urine reveals no proteinuria.  She has not had a mammogram in years.  She has never had a colonoscopy.  She denies any melena or hematochezia.  Plan:   1.  Imaging ordered:  Head MRI with contrast, abdomen and pelvic CT scan, bone scan. 2.  Follow-up pending labs (CEA and CA125). 3.  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 4.  Pelvic ultrasound to assess cystic ovarian lesions this week. 5.  Anticipate surgical consultation post GI evaluation for port placement.   Thank you for allowing me to participate in LEAIRA FULLAM 's care.  I will follow her closely with you while hospitalized and after discharge in the outpatient department.  Lequita Asal, MD  02/14/2015, 1:13 PM

## 2015-02-15 ENCOUNTER — Inpatient Hospital Stay: Payer: Medicaid Other | Admitting: Anesthesiology

## 2015-02-15 ENCOUNTER — Inpatient Hospital Stay: Payer: Self-pay

## 2015-02-15 ENCOUNTER — Encounter
Admission: EM | Disposition: A | Discharge status: Home Health | Payer: Self-pay | Source: Home / Self Care | Attending: Internal Medicine

## 2015-02-15 ENCOUNTER — Inpatient Hospital Stay: Payer: Medicaid Other

## 2015-02-15 ENCOUNTER — Encounter: Payer: Self-pay | Admitting: *Deleted

## 2015-02-15 DIAGNOSIS — C785 Secondary malignant neoplasm of large intestine and rectum: Secondary | ICD-10-CM

## 2015-02-15 DIAGNOSIS — R935 Abnormal findings on diagnostic imaging of other abdominal regions, including retroperitoneum: Secondary | ICD-10-CM | POA: Insufficient documentation

## 2015-02-15 HISTORY — PX: COLONOSCOPY WITH PROPOFOL: SHX5780

## 2015-02-15 LAB — PROTEIN ELECTROPHORESIS, SERUM
A/G Ratio: 1 (ref 0.7–1.7)
Albumin ELP: 2.9 g/dL (ref 2.9–4.4)
Alpha-1-Globulin: 0.2 g/dL (ref 0.0–0.4)
Alpha-2-Globulin: 0.9 g/dL (ref 0.4–1.0)
Beta Globulin: 1 g/dL (ref 0.7–1.3)
Gamma Globulin: 0.7 g/dL (ref 0.4–1.8)
Globulin, Total: 2.8 g/dL (ref 2.2–3.9)
Total Protein ELP: 5.7 g/dL — ABNORMAL LOW (ref 6.0–8.5)

## 2015-02-15 LAB — IGG, IGA, IGM
IgA: 121 mg/dL (ref 87–352)
IgG (Immunoglobin G), Serum: 593 mg/dL — ABNORMAL LOW (ref 700–1600)
IgM, Serum: 170 mg/dL (ref 26–217)

## 2015-02-15 LAB — PROTIME-INR
INR: 1.23
Prothrombin Time: 15.7 seconds — ABNORMAL HIGH (ref 11.4–15.0)

## 2015-02-15 LAB — KAPPA/LAMBDA LIGHT CHAINS
Kappa free light chain: 10.83 mg/L (ref 3.30–19.40)
Kappa, lambda light chain ratio: 1.91 — ABNORMAL HIGH (ref 0.26–1.65)
Lambda free light chains: 5.67 mg/L — ABNORMAL LOW (ref 5.71–26.30)

## 2015-02-15 LAB — HEPATITIS B SURFACE ANTIGEN: Hepatitis B Surface Ag: NEGATIVE

## 2015-02-15 LAB — HEPATITIS C ANTIBODY: HCV Ab: <0.1 s/co ratio (ref 0.0–0.9)

## 2015-02-15 LAB — HEPATITIS B CORE ANTIBODY, TOTAL: Hep B Core Total Ab: POSITIVE — AB

## 2015-02-15 LAB — CEA: CEA: 338.1 ng/mL — ABNORMAL HIGH (ref 0.0–4.7)

## 2015-02-15 SURGERY — COLONOSCOPY WITH PROPOFOL
Anesthesia: General

## 2015-02-15 MED ORDER — TECHNETIUM TC 99M MEDRONATE IV KIT
23.0900 | PACK | Freq: Once | INTRAVENOUS | Status: AC | PRN
  Administered 2015-02-15: 23.09 via INTRAVENOUS

## 2015-02-15 MED ORDER — LIDOCAINE HCL (CARDIAC) 20 MG/ML IV SOLN
INTRAVENOUS | Status: DC | PRN
  Administered 2015-02-15: 40 mg via INTRAVENOUS

## 2015-02-15 MED ORDER — PROPOFOL 500 MG/50ML IV EMUL
INTRAVENOUS | Status: DC | PRN
  Administered 2015-02-15: 160 ug/kg/min via INTRAVENOUS

## 2015-02-15 MED ORDER — GADOBENATE DIMEGLUMINE 529 MG/ML IV SOLN
12.0000 mL | Freq: Once | INTRAVENOUS | Status: AC | PRN
Start: 2015-02-15 — End: 2015-02-15
  Administered 2015-02-15: 12 mL via INTRAVENOUS

## 2015-02-15 MED ORDER — FENTANYL CITRATE (PF) 100 MCG/2ML IJ SOLN
INTRAMUSCULAR | Status: DC | PRN
  Administered 2015-02-15: 50 ug via INTRAVENOUS

## 2015-02-15 MED ORDER — SODIUM CHLORIDE 0.9 % IV SOLN
INTRAVENOUS | Status: AC
  Administered 2015-02-15 – 2015-02-16 (×3): via INTRAVENOUS

## 2015-02-15 MED ORDER — PROPOFOL 10 MG/ML IV BOLUS
INTRAVENOUS | Status: DC | PRN
  Administered 2015-02-15: 50 mg via INTRAVENOUS

## 2015-02-15 MED ORDER — MIDAZOLAM HCL 2 MG/2ML IJ SOLN
INTRAMUSCULAR | Status: DC | PRN
  Administered 2015-02-15: 1 mg via INTRAVENOUS

## 2015-02-15 NOTE — Transfer of Care (Signed)
Immediate Anesthesia Transfer of Care Note  Patient: Brandy Odom  Procedure(s) Performed: Procedure(s): COLONOSCOPY WITH PROPOFOL (N/A)  Patient Location: PACU and Endoscopy Unit  Anesthesia Type:General  Level of Consciousness: sedated  Airway & Oxygen Therapy: Patient Spontanous Breathing and Patient connected to nasal cannula oxygen  Post-op Assessment: Report given to RN and Post -op Vital signs reviewed and stable  Post vital signs: Reviewed and stable  Last Vitals:  Filed Vitals:   02/15/15 1502 02/15/15 1624  BP: 115/78 115/76  Pulse: 75   Temp: 36.6 C 36.4 C  Resp: 20 14    Complications: No apparent anesthesia complications

## 2015-02-15 NOTE — Progress Notes (Signed)
Patient a little emotional about her recent diagnosis of possible cancer in various areas.  Said she was having more tests today and she just wanted to talk with someone.  Prescience and emotional support given. Liberal 1200

## 2015-02-15 NOTE — Progress Notes (Signed)
Pt reports "my esophagus hurts when I swallow like it won't go down; burns". Pt reports having difficulty eating with pt taking only few bites/ drank supplement and then c/o of more pain. Diet changed to mechanical soft with pt in agreeance. Pain medication administered.

## 2015-02-15 NOTE — Progress Notes (Addendum)
[facility] [place] NAME: Brandy Odom    MR#:  [id number]  DATE OF BIRTH:  07/26/1950  SUBJECTIVE: Admitted for chronic cough, persistent shortness of breath. Noted to have a weight loss of 20 pounds in last 3 months. 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  CHIEF COMPLAINT:   Chief Complaint  Patient presents with  . Shortness of Breath  . Diplopia    REVIEW OF SYSTEMS:   Review of Systems  Constitutional: Negative for fever and chills.  HENT: Negative for hearing loss.   Eyes: Negative for blurred vision, double vision and photophobia.  Respiratory: Positive for cough and shortness of breath. Negative for hemoptysis.   Cardiovascular: Negative for palpitations, orthopnea and leg swelling.  Gastrointestinal: Negative for vomiting, abdominal pain and diarrhea.  Genitourinary: Negative for dysuria and urgency.  Musculoskeletal: Negative for myalgias and neck pain.       Upper and lower back pain, chest pain all over the chest.  Skin: Negative for rash.  Neurological: Negative for dizziness, focal weakness, seizures, weakness and headaches.  Psychiatric/Behavioral: Negative for memory loss. The patient does not have insomnia.      DRUG ALLERGIES:   Allergies  Allergen Reactions  . Bee Venom Anaphylaxis  . Naproxen Nausea And Vomiting  . Sulfa Antibiotics Other (See Comments)    Reaction:  Unknown   . Antihistamines, Chlorpheniramine-Type Palpitations    VITALS:  Blood pressure 130/81, pulse 77, temperature 98.5 F (36.9 C), temperature source Oral, resp. rate 19, height 5' 4"  (1.626 m), weight 63.458 kg (139 lb 14.4 oz), SpO2 94 %.  PHYSICAL EXAMINATION:  GENERAL:  64 y.o.-year-old patient lying in the bed with no acute distress.  EYES: Pupils equal, round, reactive to light and accommodation. No scleral icterus. Extraocular muscles intact.  HEENT: Head atraumatic, normocephalic. Oropharynx and nasopharynx clear.   NECK:  Supple, no jugular venous distention. No thyroid enlargement, no tenderness.  LUNGS: Normal breath sounds bilaterally, faint  wheezing present, rales,rhonchi or crepitation. No use of accessory muscles of respiration.  CARDIOVASCULAR: S1, S2 normal. No murmurs, rubs, or gallops.  ABDOMEN: Soft, nontender, nondistended. Bowel sounds present. No organomegaly or mass.  EXTREMITIES: No pedal edema, cyanosis, or clubbing.  NEUROLOGIC: Cranial nerves II through XII are intact. Muscle strength 5/5 in all extremities. Sensation intact. Gait not checked.  PSYCHIATRIC: The patient is alert and oriented x 3.  SKIN: No obvious rash, lesion, or ulcer.    LABORATORY PANEL:   CBC  Recent Labs Lab 02/13/15 0540  WBC 6.0  HGB 11.6*  HCT 35.0  PLT 142*   ------------------------------------------------------------------------------------------------------------------  Chemistries   Recent Labs Lab 02/13/15 0540  NA 145  K 3.6  CL 111  CO2 28  GLUCOSE 121*  BUN 8  CREATININE 0.64  CALCIUM 9.6  MG 2.0  AST 37  ALT 29  ALKPHOS 125  BILITOT 0.5   ------------------------------------------------------------------------------------------------------------------  Cardiac Enzymes  Recent Labs Lab 02/12/15 1554  TROPONINI <0.03   ------------------------------------------------------------------------------------------------------------------  RADIOLOGY:  Ct Abdomen Pelvis W Contrast  02/14/2015  CLINICAL DATA:  64 year old female with recently discrete overt bone lesions concerning for potential multiple myeloma versus metastatic disease. Mediastinal adenopathy, concerning for potential lymphoma versus metastatic disease. Further evaluation to search for potential primary malignancy. EXAM: CT ABDOMEN AND PELVIS WITH CONTRAST TECHNIQUE: Multidetector CT imaging of the abdomen and pelvis was performed using the standard protocol following bolus administration of intravenous  contrast. CONTRAST:  113m OMNIPAQUE IOHEXOL 300  MG/ML  SOLN COMPARISON:  Chest CT 02/12/2015. CT of the abdomen and pelvis 04/27/2009 not available for comparison (could not be retrieved from [employer]). CT the abdomen and pelvis 12/04/2006 is available for comparison. FINDINGS: Lower chest: Previously noted 8 x 5 mm nodule in the periphery of the left lower lobe (image 7 of series 4) is unchanged compared to the recent prior chest CT. Hepatobiliary: 1.6 x 1.2 cm hypovascular lesion in segment 5 of the liver (image 33 of series 2) adjacent to the gallbladder fossa is indeterminate. No intra or extrahepatic biliary ductal dilatation. Gallbladder is normal in appearance. Pancreas: No pancreatic mass. No pancreatic ductal dilatation. No pancreatic or peripancreatic fluid or inflammatory changes. Spleen: Sub cm low-attenuation lesion in the superior aspect of the spleen is too small to definitively characterize, but is favored to represent a small cyst. Adrenals/Urinary Tract: Bilateral adrenal nodules are noted, measuring 1.4 x 2.2 cm on the right and 2.0 x 2.5 cm on the left, highly concerning for metastatic lesions. Sub cm low-attenuation lesions in both kidneys are too small to definitively characterize, but favored to represent cysts. Exophytic 8 mm high attenuation lesion (63 HU on portal venous phase imaging and 64 HU on delayed imaging) is incompletely characterized, but favored to represent a proteinaceous/hemorrhagic cyst. No hydroureteronephrosis. Urinary bladder is normal in appearance. Stomach/Bowel: The appearance of the stomach is normal. No pathologic dilatation of small bowel or colon. In the medial aspect of the cecum there is mass-like soft tissue thickening, best appreciated on axial image 49 of series 2 and coronal image 61 of series 5, measuring approximately 3.7 x 3.2 x 3.7 cm. There is haziness in the surrounding pericolonic fat, and adjacent lymphadenopathy which appears contiguous with this  apparent colonic mass. The largest lymph node is in the ileocolic mesentery on axial image 44 of series 2 and coronal image 58 of series 5, measuring 2.4 x 2.9 x 3.8 cm. Normal appendix. Vascular/Lymphatic: Atherosclerosis throughout the abdominal and pelvic vasculature, without evidence of aneurysm or dissection. As discussed above there is significant ileocolic lymphadenopathy. Multiple prominent borderline enlarged retroperitoneal lymph nodes are noted, measuring up to 7 mm in short axis immediately anterior to the lower pole of the right kidney. Mildly enlarged lymph node adjacent to the superior mesenteric vein (image 36 of series 2) measuring 1 cm in short axis. Multiple other borderline enlarged mesenteric lymph nodes are also noted, which are conspicuous but nonspecific. Reproductive: Uterus is unremarkable in appearance. Multiple well-defined low to intermediate attenuation lesions in the ovaries bilaterally (right greater than left), measuring up to 2.4 cm in diameter in the inferior aspect of the right ovary, unusual in a postmenopausal patient. Coarse calcification in the left ovary anteriorly. Other: Trace volume of ascites in the low anatomic pelvis. No pneumoperitoneum. Musculoskeletal: Numerous mixed lytic and sclerotic lesions are noted throughout the visualized axial and appendicular skeleton, concerning for widespread metastatic disease to the bones. The largest of these involves the superior endplate of L4 on the right side measuring 3.0 x 1.8 cm (image 39 of series 2) where there is a pathologic compression with approximately 25% loss of height focally. IMPRESSION: 1. Findings, as above, highly concerning for primary colonic neoplasm centered in the cecum, with associated ileocolic lymphadenopathy, probable metastatic disease to the adrenal glands bilaterally, suspicious hypovascular lesion in segment 5 of the liver which may represent a hepatic metastasis, small pulmonary nodule left lower lobe  concerning for metastatic lesion, in addition to widespread osseous metastasis,  as detailed above. 2. In addition, there are multiple low to intermediate attenuation cystic appearing lesions in the ovaries bilaterally. This appearance is unusual in a postmenopausal female. Further evaluation with nonemergent pelvic ultrasound is suggested in the near future sure further characterization. This recommendation follows ACR consensus guidelines: White Paper of the ACR Incidental Findings Committee II on Adnexal Findings. J Am Coll Radiol 2013:10:675-681. 3. Extensive atherosclerosis. Electronically Signed   By: Vinnie Langton M.D.   On: 02/14/2015 12:18    EKG:   Orders placed or performed during the hospital encounter of 02/12/15  . ED EKG  . ED EKG    ASSESSMENT AND PLAN:    1 shortness of breath and hypoxia was multifactorial secondary to COPD exacerbation, improving Her COPD exacerbation she is on oxygen, Solu-Medrol, nebulizers. Wheezing in the lungs.  #2 . 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  Spine; the mother of the brain, bone scan today, follow-up tumor markers with CEA, CA 125 GI consult, possible surgical consult for port placement. Appreciate oncology follow.  Nothing by mouth after bone scan, continue IV hydration when NPO. Scheduled to have colonoscopy, MRI of the brain, bone scan today.  #3 Severe malnutrition  in the context of chronic illness; continue ensure enlive  #4. Ovarian cystic lesion: Pelvic ultrasound  \ #5 diffuse bone pains: Continue pain medication. D/w son : All the records are reviewed and case discussed with Care Management/Social Workerr. Management plans discussed with the patient, family and they are in agreement.  CODE STATUS: full  TOTAL TIME TAKING CARE OF THIS PATIENT: 35 minutes.   POSSIBLE D/C IN 1-2DAYS, DEPENDING ON CLINICAL CONDITION.   Epifanio Lesches M.D on 02/15/2015 at 11:38  AM  Between 7am to 6pm - Pager - [phone]  After 6pm go to [email or web address] - password [email or web address] [facility]  [facility]  Office  [phone]  CC: Primary care physician; No PCP Per Patient   Note: This dictation was prepared with Dragon dictation along with smaller phrase technology. Any transcriptional errors that result from this process are unintentional.

## 2015-02-15 NOTE — Care Management (Signed)
Admitted to [facility] with the diagnosis of metastasis vs myloma. Lives with husband, Chrissie Noa [phone]. No insurance. No primary care physician.  No home Health. No skilled facility. No prescription medications. Uses no aids for ambulation. Seen in the emergency room a few days ago, Lost 20 lbs in 3 months. Advised to follow-up at [facility].  Oncology consult per Dr. Inda Castle RN MSN CCM Care Management [phone]

## 2015-02-15 NOTE — Progress Notes (Signed)
  Consult received from GI. Images and colonoscopy results reviewed. Likely metastatic colon cancer  Full consult to follow tomorrow  Clayburn Pert, MD [facility] General Surgeon [facility] Surgical 02/15/2015

## 2015-02-15 NOTE — Op Note (Signed)
[facility] Gastroenterology Patient Name: Brandy Odom Procedure Date: 02/15/2015 1:16 PM MRN: [id number] Account #: [id number] Date of Birth: Jun 21, 1950 Admit Type: Outpatient Age: 64 Room: [facility] ENDO ROOM 2 Gender: Female Note Status: Finalized Procedure:         Colonoscopy Indications:       Abnormal CT of the GI tract Providers:         Lucilla Lame, MD Medicines:         Propofol per Anesthesia Complications:     No immediate complications. Procedure:         Pre-Anesthesia Assessment:                    - Prior to the procedure, a History and Physical was                     performed, and patient medications and allergies were                     reviewed. The patient's tolerance of previous anesthesia                     was also reviewed. The risks and benefits of the procedure                     and the sedation options and risks were discussed with the                     patient. All questions were answered, and informed consent                     was obtained. Prior Anticoagulants: The patient has taken                     no previous anticoagulant or antiplatelet agents. ASA                     Grade Assessment: II - A patient with mild systemic                     disease. After reviewing the risks and benefits, the                     patient was deemed in satisfactory condition to undergo                     the procedure.                    After obtaining informed consent, the colonoscope was                     passed under direct vision. Throughout the procedure, the                     patient's blood pressure, pulse, and oxygen saturations                     were monitored continuously. The Colonoscope was                     introduced through the anus and advanced to the the cecum,                     identified by the appendiceal orifice. The  colonoscopy was                     performed without difficulty. The patient tolerated the                      procedure well. The quality of the bowel preparation was                     fair. Findings:      The perianal and digital rectal examinations were normal.      A frond-like/villous partially obstructing large mass was found in the       cecum. The mass was non-circumferential. No bleeding was present. This       was biopsied with a cold forceps for histology.      Multiple small-mouthed diverticula were found in the sigmoid colon. Impression:        - Likely malignant partially obstructing tumor in the                     cecum. Removal was not done. Biopsied.                    - Diverticulosis in the sigmoid colon. Recommendation:    - Await pathology results. Procedure Code(s): --- Professional ---                    [phone], Colonoscopy, flexible; with biopsy, single or                     multiple Diagnosis Code(s): --- Professional ---                    R93.3, Abnormal findings on diagnostic imaging of other                     parts of digestive tract                    D49.0, Neoplasm of unspecified behavior of digestive system                    K57.30, Diverticulosis of large intestine without                     perforation or abscess without bleeding CPT copyright 2014 American Medical Association. All rights reserved. The codes documented in this report are preliminary and upon coder review may  be revised to meet current compliance requirements. Lucilla Lame, MD 02/15/2015 4:18:57 PM This report has been signed electronically. Number of Addenda: 0 Note Initiated On: 02/15/2015 1:16 PM Scope Withdrawal Time: 0 hours 4 minutes 37 seconds  Total Procedure Duration: 0 hours 12 minutes 52 seconds       [facility]

## 2015-02-15 NOTE — Plan of Care (Signed)
Problem: Safety: Goal: Ability to remain free from injury will improve Outcome: Progressing No injury this shift  Problem: Pain Managment: Goal: General experience of comfort will improve Outcome: Progressing Pain med given with improvement

## 2015-02-15 NOTE — Anesthesia Preprocedure Evaluation (Addendum)
Anesthesia Evaluation  Patient identified by MRN, date of birth, ID band Patient awake    Reviewed: Allergy & Precautions, NPO status , Patient's Chart, lab work & pertinent test results  Airway Mallampati: III  TM Distance: <3 FB Neck ROM: Full    Dental  (+) Chipped, Caps   Pulmonary shortness of breath and with exertion, COPD, Current Smoker,  Lesion of lung   Pulmonary exam normal        Cardiovascular Normal cardiovascular exam     Neuro/Psych negative neurological ROS  negative psych ROS   GI/Hepatic   Endo/Other    Renal/GU      Musculoskeletal Muscle spasm   Abdominal Normal abdominal exam  (+)   Peds negative pediatric ROS (+)  Hematology negative hematology ROS (+)   Anesthesia Other Findings Muscle spasm  Reproductive/Obstetrics                           Anesthesia Physical Anesthesia Plan  ASA: III  Anesthesia Plan: General   Post-op Pain Management:    Induction: Intravenous  Airway Management Planned: Nasal Cannula  Additional Equipment:   Intra-op Plan:   Post-operative Plan:   Informed Consent: I have reviewed the patients History and Physical, chart, labs and discussed the procedure including the risks, benefits and alternatives for the proposed anesthesia with the patient or authorized representative who has indicated his/her understanding and acceptance.   Dental advisory given  Plan Discussed with: CRNA and Surgeon  Anesthesia Plan Comments:         Anesthesia Quick Evaluation

## 2015-02-15 NOTE — Plan of Care (Signed)
Pt had bone scan today - showed abnormal intake through skull cap, ribs, vertebral bodies, pelvis, bilateral femurs and shoulders.  Colonoscopy showed mass in cecum - likely metastatic colon ca.  MRI of brain. Has blurred vision in R eye.  CEA 338.  Pt has some anxiety and reports pain of 9.  Pt calls out for assistance and is steady in ambulating to BR.  Husband at bedside. Dr. Discussed having port placed and plan to control pain.  Possible d/c on Wed.

## 2015-02-15 NOTE — Anesthesia Procedure Notes (Signed)
Date/Time: 02/15/2015 3:48 PM Performed by: Doreen Salvage Pre-anesthesia Checklist: Patient identified, Emergency Drugs available, Suction available and Patient being monitored Patient Re-evaluated:Patient Re-evaluated prior to inductionOxygen Delivery Method: Nasal cannula Intubation Type: IV induction Dental Injury: Teeth and Oropharynx as per pre-operative assessment  Comments: Nasal cannula with etCO2 monitoring

## 2015-02-15 NOTE — Anesthesia Postprocedure Evaluation (Signed)
Anesthesia Post Note  Patient: Brandy Odom  Procedure(s) Performed: Procedure(s) (LRB): COLONOSCOPY WITH PROPOFOL (N/A)  Patient location during evaluation: Endoscopy Anesthesia Type: General Level of consciousness: awake, awake and alert and oriented Pain management: pain level controlled Vital Signs Assessment: post-procedure vital signs reviewed and stable Respiratory status: spontaneous breathing Cardiovascular status: blood pressure returned to baseline Anesthetic complications: no    Last Vitals:  Filed Vitals:   02/15/15 1643 02/15/15 1654  BP: 143/86 152/80  Pulse: 89 74  Temp:    Resp: 12 12    Last Pain:  Filed Vitals:   02/15/15 1702  PainSc: 8                  [name]

## 2015-02-16 ENCOUNTER — Ambulatory Visit: Payer: Self-pay

## 2015-02-16 ENCOUNTER — Inpatient Hospital Stay: Payer: Medicaid Other

## 2015-02-16 ENCOUNTER — Institutional Professional Consult (permissible substitution): Payer: Self-pay | Admitting: Radiation Oncology

## 2015-02-16 DIAGNOSIS — R599 Enlarged lymph nodes, unspecified: Secondary | ICD-10-CM | POA: Insufficient documentation

## 2015-02-16 DIAGNOSIS — C7951 Secondary malignant neoplasm of bone: Secondary | ICD-10-CM

## 2015-02-16 DIAGNOSIS — R948 Abnormal results of function studies of other organs and systems: Secondary | ICD-10-CM | POA: Insufficient documentation

## 2015-02-16 DIAGNOSIS — C189 Malignant neoplasm of colon, unspecified: Secondary | ICD-10-CM

## 2015-02-16 DIAGNOSIS — R591 Generalized enlarged lymph nodes: Secondary | ICD-10-CM | POA: Insufficient documentation

## 2015-02-16 LAB — UPEP/TP, 24-HR URINE
Albumin, U: 10.5 %
Alpha 1, Urine: 1.3 %
Alpha 2, Urine: 23.1 %
Beta, Urine: 33.9 %
Gamma Globulin, Urine: 31.3 %
M-Spike, mg/24 hr: <22.4 mg/24 hr — ABNORMAL HIGH
M-spike, %: 24.6 % — ABNORMAL HIGH
Total Protein, Urine-Ur/day: <91 mg/24 hr (ref 30.0–150.0)
Total Protein, Urine: <4 mg/dL
Total Volume: 2275

## 2015-02-16 LAB — CBC WITH DIFFERENTIAL/PLATELET
BASOS PCT: 0 %
Basophils Absolute: 0 10*3/uL (ref 0–0.1)
EOS ABS: 0 10*3/uL (ref 0–0.7)
EOS PCT: 1 %
HCT: 36.4 % (ref 35.0–47.0)
Hemoglobin: 12.2 g/dL (ref 12.0–16.0)
LYMPHS ABS: 0.8 10*3/uL — AB (ref 1.0–3.6)
Lymphocytes Relative: 10 %
MCH: 27.3 pg (ref 26.0–34.0)
MCHC: 33.5 g/dL (ref 32.0–36.0)
MCV: 81.5 fL (ref 80.0–100.0)
MONOS PCT: 5 %
Monocytes Absolute: 0.4 10*3/uL (ref 0.2–0.9)
Neutro Abs: 6.7 10*3/uL — ABNORMAL HIGH (ref 1.4–6.5)
Neutrophils Relative %: 84 %
PLATELETS: 176 10*3/uL (ref 150–440)
RBC: 4.47 MIL/uL (ref 3.80–5.20)
RDW: 17.1 % — ABNORMAL HIGH (ref 11.5–14.5)
WBC: 8 10*3/uL (ref 3.6–11.0)

## 2015-02-16 LAB — CREATININE, SERUM
CREATININE: 0.77 mg/dL (ref 0.44–1.00)
GFR calc Af Amer: >60 mL/min (ref >60)

## 2015-02-16 LAB — CA 125: CA 125: 38.3 U/mL — ABNORMAL HIGH (ref 0.0–38.1)

## 2015-02-16 NOTE — Progress Notes (Signed)
[place] [facility] [place] NAME: Brandy Odom    MR#:  [id number]  DATE OF BIRTH:  1950/05/31  SUBJECTIVE: Admitted for chronic cough, persistent shortness of breath. Noted to have a weight loss of 20 pounds in last 3 months. Found to have metastatic answer. She says she does not want chemotherapy and wanted to be comfortable but she is asking written note of her iagnosis so she can understand better and make the decision. Going for whole brain radiation tomorrow.   CHIEF COMPLAINT:   Chief Complaint  Patient presents with  . Shortness of Breath  . Diplopia    REVIEW OF SYSTEMS:   Review of Systems  Constitutional: Negative for fever and chills.  HENT: Negative for hearing loss.   Eyes: Negative for blurred vision, double vision and photophobia.  Respiratory: Positive for cough and shortness of breath. Negative for hemoptysis.   Cardiovascular: Negative for palpitations, orthopnea and leg swelling.  Gastrointestinal: Negative for vomiting, abdominal pain and diarrhea.  Genitourinary: Negative for dysuria and urgency.  Musculoskeletal: Negative for myalgias and neck pain.       Upper and lower back pain, chest pain all over the chest.  Skin: Negative for rash.  Neurological: Negative for dizziness, focal weakness, seizures, weakness and headaches.  Psychiatric/Behavioral: Negative for memory loss. The patient does not have insomnia.      DRUG ALLERGIES:   Allergies  Allergen Reactions  . Bee Venom Anaphylaxis  . Naproxen Nausea And Vomiting  . Sulfa Antibiotics Other (See Comments)    Reaction:  Unknown   . Antihistamines, Chlorpheniramine-Type Palpitations    VITALS:  Blood pressure 131/85, pulse 85, temperature 98 F (36.7 C), temperature source Oral, resp. rate 18, height 5' 4"  (1.626 m), weight 63.05 kg (139 lb), SpO2 96 %.  PHYSICAL EXAMINATION:  GENERAL:  64 y.o.-year-old patient lying in the bed with no acute  distress.  EYES: Pupils equal, round, reactive to light and accommodation. No scleral icterus. Extraocular muscles intact.  HEENT: Head atraumatic, normocephalic. Oropharynx and nasopharynx clear.  NECK:  Supple, no jugular venous distention. No thyroid enlargement, no tenderness.  LUNGS: Normal breath sounds bilaterally, faint  wheezing present, rales,rhonchi or crepitation. No use of accessory muscles of respiration.  CARDIOVASCULAR: S1, S2 normal. No murmurs, rubs, or gallops.  ABDOMEN: Soft, nontender, nondistended. Bowel sounds present. No organomegaly or mass.  EXTREMITIES: No pedal edema, cyanosis, or clubbing.  NEUROLOGIC: Cranial nerves II through XII are intact. Muscle strength 5/5 in all extremities. Sensation intact. Gait not checked.  PSYCHIATRIC: The patient is alert and oriented x 3.  SKIN: No obvious rash, lesion, or ulcer.    LABORATORY PANEL:   CBC  Recent Labs Lab 02/16/15 0454  WBC 8.0  HGB 12.2  HCT 36.4  PLT 176   ------------------------------------------------------------------------------------------------------------------  Chemistries   Recent Labs Lab 02/13/15 0540 02/16/15 0454  NA 145  --   K 3.6  --   CL 111  --   CO2 28  --   GLUCOSE 121*  --   BUN 8  --   CREATININE 0.64 0.77  CALCIUM 9.6  --   MG 2.0  --   AST 37  --   ALT 29  --   ALKPHOS 125  --   BILITOT 0.5  --    ------------------------------------------------------------------------------------------------------------------  Cardiac Enzymes  Recent Labs Lab 02/12/15 1554  TROPONINI <0.03   ------------------------------------------------------------------------------------------------------------------  RADIOLOGY:  Dg Shoulder Right  02/16/2015  CLINICAL DATA:  Abnormal bone scan EXAM: RIGHT SHOULDER - 2+ VIEW COMPARISON:  Bone scan 02/15/2015 and CT scan 02/12/2015 FINDINGS: Three views of the right shoulder submitted. No acute fracture or subluxation. No cortical  breakthrough or cortical reaction. There is a lytic lesion in right scapula just medial to glenoid measures about 1.3 cm. IMPRESSION: No acute fracture or subluxation. No cortical breakthrough or cortical reaction. There is a lytic lesion in right scapula just medial to glenoid measures about 1.3 cm. Electronically Signed   By: Lahoma Crocker M.D.   On: 02/16/2015 08:13   Nm Bone Scan Whole Body  02/15/2015  CLINICAL DATA:  Colonic lesion and multiple bone lesions. EXAM: NUCLEAR MEDICINE WHOLE BODY BONE SCAN TECHNIQUE: Whole body anterior and posterior images were obtained approximately 3 hours after intravenous injection of radiopharmaceutical. RADIOPHARMACEUTICALS:  23.09 mCi Technetium-22mMDP IV COMPARISON:  Chest CT 02/12/2015 and abdominal CT 02/14/2015 FINDINGS: Innumerable abnormal foci throughout the calvarium. Numerous abnormal foci involving bilateral ribs and multiple vertebral bodies. The most prominent vertebral body uptake is at L2 and T11. Subtle abnormal foci in the femurs bilaterally. Markedly increased uptake involving the proximal left humerus. Cannot exclude abnormal uptake in the right shoulder region. Multiple abnormal foci in the pelvis. Abnormal uptake involving the lateral left clavicle. IMPRESSION: Abnormal uptake throughout the calvarium, ribs, vertebral bodies, pelvis, bilateral femurs and shoulders. 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 Electronically Signed   By: AMarkus DaftM.D.   On: 02/15/2015 15:53   UKoreaTransvaginal Non-ob  02/16/2015  CLINICAL DATA:  Cyst seen on ovary. EXAM: TRANSABDOMINAL AND TRANSVAGINAL ULTRASOUND OF PELVIS TECHNIQUE: Both transabdominal and transvaginal ultrasound examinations of the pelvis were performed. Transabdominal technique was performed for global imaging of the pelvis including uterus, ovaries, adnexal regions, and pelvic cul-de-sac. It was necessary to proceed with endovaginal exam following the transabdominal exam to visualize  the endometrium and ovaries. COMPARISON:  CT scan of February 14, 2015. FINDINGS: Uterus Measurements: 6.9 x 4.0 x 2.8 cm. No fibroids or other mass visualized. Endometrium Thickness: 12.3 mm which is abnormally thickened for postmenopausal patient. 4 mm cystic areas seen in endometrium. Right ovary Measurements: 4.5 x 3.3 x 3.1 cm. Internal flow is seen on Doppler, and this is enlarged in size for postmenopausal patient. Left ovary Measurements: 5.0 x 3.1 x 2.9 cm. Internal flow is seen on Doppler, and this is enlarged in size for postmenopausal patient. Other findings No free fluid. IMPRESSION: Endometrial thickness (12 mm) is considered abnormal for an asymptomatic post-menopausal female. Endometrial sampling should be considered to exclude carcinoma. Ovaries appear enlarged and somewhat lobular in appearance, which is unusual for postmenopausal patient. Given the presence of probable metastatic lesions elsewhere in the body described on prior CT scan, the possibility of bilateral Krukenberg tumors in the ovaries cannot be excluded. MRI may be performed for further evaluation. Electronically Signed   By: JMarijo Conception M.D.   On: 02/16/2015 11:36   UKoreaPelvis Complete  02/16/2015  CLINICAL DATA:  Cyst seen on ovary. EXAM: TRANSABDOMINAL AND TRANSVAGINAL ULTRASOUND OF PELVIS TECHNIQUE: Both transabdominal and transvaginal ultrasound examinations of the pelvis were performed. Transabdominal technique was performed for global imaging of the pelvis including uterus, ovaries, adnexal regions, and pelvic cul-de-sac. It was necessary to proceed with endovaginal exam following the transabdominal exam to visualize the endometrium and ovaries. COMPARISON:  CT scan of February 14, 2015. FINDINGS: Uterus Measurements: 6.9 x 4.0 x 2.8 cm. No fibroids or other mass  visualized. Endometrium Thickness: 12.3 mm which is abnormally thickened for postmenopausal patient. 4 mm cystic areas seen in endometrium. Right ovary  Measurements: 4.5 x 3.3 x 3.1 cm. Internal flow is seen on Doppler, and this is enlarged in size for postmenopausal patient. Left ovary Measurements: 5.0 x 3.1 x 2.9 cm. Internal flow is seen on Doppler, and this is enlarged in size for postmenopausal patient. Other findings No free fluid. IMPRESSION: Endometrial thickness (12 mm) is considered abnormal for an asymptomatic post-menopausal female. Endometrial sampling should be considered to exclude carcinoma. Ovaries appear enlarged and somewhat lobular in appearance, which is unusual for postmenopausal patient. Given the presence of probable metastatic lesions elsewhere in the body described on prior CT scan, the possibility of bilateral Krukenberg tumors in the ovaries cannot be excluded. MRI may be performed for further evaluation. Electronically Signed   By: Marijo Conception, M.D.   On: 02/16/2015 11:36   Ct Abdomen Pelvis W Contrast  02/14/2015  CLINICAL DATA:  64 year old female with recently discrete overt bone lesions concerning for potential multiple myeloma versus metastatic disease. Mediastinal adenopathy, concerning for potential lymphoma versus metastatic disease. Further evaluation to search for potential primary malignancy. EXAM: CT ABDOMEN AND PELVIS WITH CONTRAST TECHNIQUE: Multidetector CT imaging of the abdomen and pelvis was performed using the standard protocol following bolus administration of intravenous contrast. CONTRAST:  146m OMNIPAQUE IOHEXOL 300 MG/ML  SOLN COMPARISON:  Chest CT 02/12/2015. CT of the abdomen and pelvis 04/27/2009 not available for comparison (could not be retrieved from [employer]. CT the abdomen and pelvis 12/04/2006 is available for comparison. FINDINGS: Lower chest: Previously noted 8 x 5 mm nodule in the periphery of the left lower lobe (image 7 of series 4) is unchanged compared to the recent prior chest CT. Hepatobiliary: 1.6 x 1.2 cm hypovascular lesion in segment 5 of the liver (image 33 of series 2)  adjacent to the gallbladder fossa is indeterminate. No intra or extrahepatic biliary ductal dilatation. Gallbladder is normal in appearance. Pancreas: No pancreatic mass. No pancreatic ductal dilatation. No pancreatic or peripancreatic fluid or inflammatory changes. Spleen: Sub cm low-attenuation lesion in the superior aspect of the spleen is too small to definitively characterize, but is favored to represent a small cyst. Adrenals/Urinary Tract: Bilateral adrenal nodules are noted, measuring 1.4 x 2.2 cm on the right and 2.0 x 2.5 cm on the left, highly concerning for metastatic lesions. Sub cm low-attenuation lesions in both kidneys are too small to definitively characterize, but favored to represent cysts. Exophytic 8 mm high attenuation lesion (63 HU on portal venous phase imaging and 64 HU on delayed imaging) is incompletely characterized, but favored to represent a proteinaceous/hemorrhagic cyst. No hydroureteronephrosis. Urinary bladder is normal in appearance. Stomach/Bowel: The appearance of the stomach is normal. No pathologic dilatation of small bowel or colon. In the medial aspect of the cecum there is mass-like soft tissue thickening, best appreciated on axial image 49 of series 2 and coronal image 61 of series 5, measuring approximately 3.7 x 3.2 x 3.7 cm. There is haziness in the surrounding pericolonic fat, and adjacent lymphadenopathy which appears contiguous with this apparent colonic mass. The largest lymph node is in the ileocolic mesentery on axial image 44 of series 2 and coronal image 58 of series 5, measuring 2.4 x 2.9 x 3.8 cm. Normal appendix. Vascular/Lymphatic: Atherosclerosis throughout the abdominal and pelvic vasculature, without evidence of aneurysm or dissection. As discussed above there is significant ileocolic lymphadenopathy. Multiple prominent borderline enlarged retroperitoneal  lymph nodes are noted, measuring up to 7 mm in short axis immediately anterior to the lower pole of  the right kidney. Mildly enlarged lymph node adjacent to the superior mesenteric vein (image 36 of series 2) measuring 1 cm in short axis. Multiple other borderline enlarged mesenteric lymph nodes are also noted, which are conspicuous but nonspecific. Reproductive: Uterus is unremarkable in appearance. Multiple well-defined low to intermediate attenuation lesions in the ovaries bilaterally (right greater than left), measuring up to 2.4 cm in diameter in the inferior aspect of the right ovary, unusual in a postmenopausal patient. Coarse calcification in the left ovary anteriorly. Other: Trace volume of ascites in the low anatomic pelvis. No pneumoperitoneum. Musculoskeletal: Numerous mixed lytic and sclerotic lesions are noted throughout the visualized axial and appendicular skeleton, concerning for widespread metastatic disease to the bones. The largest of these involves the superior endplate of L4 on the right side measuring 3.0 x 1.8 cm (image 39 of series 2) where there is a pathologic compression with approximately 25% loss of height focally. IMPRESSION: 1. 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 2. In addition, there are multiple low to intermediate attenuation cystic appearing lesions in the ovaries bilaterally. This appearance is unusual in a postmenopausal female. Further evaluation with nonemergent pelvic ultrasound is suggested in the near future sure further characterization. This recommendation follows ACR consensus guidelines: White Paper of the ACR Incidental Findings Committee II on Adnexal Findings. J Am Coll  Radiol 2013:10:675-681. 3. Extensive atherosclerosis. Electronically Signed   By: Vinnie Langton M.D.   On: 02/14/2015 12:18   Dg Shoulder Left  02/16/2015  CLINICAL DATA:  Abnormal bone scan. EXAM: LEFT SHOULDER - 2+ VIEW COMPARISON:  Bone scan 02/15/2015. FINDINGS: Rounded lucency in the proximal humerus in the region of bone scan abnormality is present consistent the lytic lesion. Questionable small lytic lesion in the acromion process of the left scapula. Given recent bone scan findings, findings consistent with metastatic disease. IMPRESSION: 1. Prominent lytic lesion in the proximal humerus.  No fracture. 2. Questionable tiny lytic lesion in the left acromion . Electronically Signed   By: Marcello Moores  Register   On: 02/16/2015 08:12   Dg Femur Min 2 Views Left  02/16/2015  CLINICAL DATA:  Abnormal radionuclide scan.  Rule out bone lesion. EXAM: LEFT FEMUR 2 VIEWS COMPARISON:  Bone scan 02/15/2015 FINDINGS: Negative for fracture. No lytic or sclerotic bone lesion. Bone scan reveals mild diffuse uptake in a patchy configuration throughout the left femur. No definite radiographic correlation. Left hip joint normal. IMPRESSION: Abnormal bone scan in the left femur does not show lytic or sclerotic bone lesion or fracture. Findings remain concerning for metastatic disease or myeloma. Electronically Signed   By: Franchot Gallo M.D.   On: 02/16/2015 08:12   Dg Femur, Min 2 Views Right  02/16/2015  CLINICAL DATA:  Colonic lesion. Abnormal bone scan with multiple osseous lesions. EXAM: RIGHT FEMUR 2 VIEWS COMPARISON:  Bone scan 02/15/2015.  Pelvic CT 02/14/2015. FINDINGS: The mineralization and alignment are normal. There is no evidence of acute fracture or dislocation. No focal lytic or blastic lesions are identified within the right femur. Subtle blastic lesions in the right hemipelvis  are better seen on recent CT. There are no significant arthropathic changes. IMPRESSION: No radiographic evidence of  metastatic disease within the right femur or pathologic fracture. Given the predominately blastic nature of the osseous disease and adenopathy on prior CT, consider lymphoma. Electronically Signed   By: Richardean Sale M.D.   On: 02/16/2015 08:14   Mr Darnelle Catalan Wo/w Cm  02/15/2015  CLINICAL DATA:  Multiple myeloma. Soft tissue lesion in the left orbit. Abnormal CT scan. EXAM: MRI OF THE ORBITS WITHOUT AND WITH CONTRAST TECHNIQUE: Multiplanar, multisequence MR imaging of the orbits was performed both before and after the administration of intravenous contrast. CONTRAST:  58m MULTIHANCE GADOBENATE DIMEGLUMINE 529 MG/ML IV SOLN COMPARISON:  With with right severe duodenum MRI for this lesion retrospect upon CT which could items curious the cut were this is would is but FINDINGS: The lesion on the CT scan is centered within the left superior oblique muscle belly. A focal area of enhancement measures 7.0 x 10.5 x 9.5 mm. Multiple focal areas of enhancement are present within the brain. Lesion along the inferior left paramedian vermis measures 5.4 mm on image 5 of series 15. Punctate lateral cerebellar lesions are present bilaterally. A right paramedian superior vermis lesion is evident on image 5 of series 13. A punctate lesion is present in the left thalamus. There is a punctate lesion near the left caudate head. A lesion at the left globus pallidus measures 3 mm. A 5 mm anterior right frontal lobe lesion is present. There is thickening and enhancement of the pituitary infundibulum. The optic chiasm is within normal limits. The globes are within normal limits. No other focal lesions are present. The superior ophthalmic veins are within normal limits bilaterally. The optic nerves are unremarkable. The rectus musculature is otherwise within normal limits. Multiple calvarial lesions are present with heterogeneous enhancement throughout the calvarium. IMPRESSION: 1. 7.0 x 10.5 x 9.5 mm lesion within the belly of the left  superior oblique muscle is most compatible with a focal metastasis given multiple other brain lesions. 2. Multiple focal areas enhancement involving the cerebellum bilaterally, the left basal ganglia and thalamus, and the anteromedial right frontal lobe. These are also concerning for metastatic disease. 3. Thickening and enhancement of the pituitary infundibulum. This can be seen with lymphoma but also with other metastatic disease. 4. The globes and orbits are otherwise unremarkable. 5. Diffuse osseous lesions. This may be related to metastatic disease or the patient's known multiple myeloma. Electronically Signed   By: CSan MorelleM.D.   On: 02/15/2015 14:48    EKG:   Orders placed or performed during the hospital encounter of 02/12/15  . ED EKG  . ED EKG    ASSESSMENT AND PLAN:    1 shortness of breath and hypoxia was multifactorial secondary to COPD exacerbation, improving Her COPD exacerbation she is on oxygen, Solu-Medrol, nebulizers. Wheezing in the lungs.  #2 . 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  Spine; possibly to the ovaries with Krukenberg tumors by ultrasound. Patient is going for radiation of the brain tomorrow and possible evaluation for palliative radiation therapy. He does not want chemotherapy. Surgery will decide to get port  depending on patient's decision to go for chemo or  Not, #3 Severe malnutrition  in the context of chronic illness; continue ensure enlive Prognosis  Poor,consult palliative care.  \ #5 diffuse bone pains: Continue pain medication. D/w husband : All the records are reviewed and case  discussed with Care Management/Social Workerr. Management plans discussed with the patient, family and they are in agreement.  CODE STATUS: full  TOTAL TIME TAKING CARE OF THIS PATIENT: 35 minutes.   POSSIBLE D/C IN 1-2DAYS, DEPENDING ON CLINICAL CONDITION.   Epifanio Lesches M.D on 02/16/2015 at 11:41  AM  Between 7am to 6pm - Pager - [phone]  After 6pm go to [email or web address] - password [email or web address] [facility]  Office  [phone]  CC: Primary care physician; No PCP Per Patient   Note: This dictation was prepared with Dragon dictation along with smaller phrase technology. Any transcriptional errors that result from this process are unintentional.

## 2015-02-16 NOTE — Consult Note (Signed)
Patient ID: Brandy Odom, female   DOB: Jan 20, 1951, 64 y.o.   MRN: [id number]  CC: Malaise  HPI Brandy Odom is a 64 y.o. female admitted through the emergency department for shortness of breath and a persistent cough. Patient also states complaints of multiple pains including a longer chest wall, back, left arm shooting down her sidewall, head. Majority of the pains are stabbing and throbbing in nature. Currently relieved with as needed medications. Patient also has noted a change in vision or double vision through her left eye and headaches. During workup through the emergency department she was found to have fluid believed to be widely metastatic disease. Her hospital workup has included a colonoscopy which showed a large colon cancer near her ileocecal valve. Patient has also noted increasing fatigue and weight loss over the last several months. 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 She denies any current nausea or vomiting and has been having bowel function.  HPI  Past Medical History  Diagnosis Date  . Tobacco abuse   . Muscle spasm   . Cancer [facility])     Past Surgical History  Procedure Laterality Date  . Appendectomy      Family History  Problem Relation Age of Onset  . Colon cancer      Social History Social History  Substance Use Topics  . Smoking status: Current Every Day Smoker    Types: Cigarettes  . Smokeless tobacco: None  . Alcohol Use: No    Allergies  Allergen Reactions  . Bee Venom Anaphylaxis  . Naproxen Nausea And Vomiting  . Sulfa Antibiotics Other (See Comments)    Reaction:  Unknown   . Antihistamines, Chlorpheniramine-Type Palpitations    Current Facility-Administered Medications  Medication Dose Route Frequency Provider Last Rate Last Dose  . 0.9 %  sodium chloride infusion   Intravenous Continuous Epifanio Lesches, MD 75 mL/hr at 02/16/15 0655    . acetaminophen (TYLENOL) tablet 650 mg  650 mg Oral  Q6H PRN Lance Coon, MD       Or  . acetaminophen (TYLENOL) suppository 650 mg  650 mg Rectal Q6H PRN Lance Coon, MD      . benzonatate (TESSALON) capsule 200 mg  200 mg Oral TID PRN Lance Coon, MD   200 mg at 02/15/15 2251  . diazepam (VALIUM) tablet 2 mg  2 mg Oral Q6H PRN Epifanio Lesches, MD   2 mg at 02/16/15 0541  . enoxaparin (LOVENOX) injection 40 mg  40 mg Subcutaneous Q24H Lance Coon, MD   40 mg at 02/16/15 0034  . feeding supplement (ENSURE ENLIVE) (ENSURE ENLIVE) liquid 237 mL  237 mL Oral TID Epifanio Lesches, MD   237 mL at 02/16/15 0846  . HYDROcodone-acetaminophen (NORCO/VICODIN) 5-325 MG per tablet 1 tablet  1 tablet Oral Q4H PRN Lance Coon, MD   1 tablet at 02/15/15 2154  . HYDROmorphone (DILAUDID) injection 2 mg  2 mg Intravenous Q4H PRN Epifanio Lesches, MD   2 mg at 02/16/15 0720  . iohexol (OMNIPAQUE) 240 MG/ML injection 50 mL  50 mL Oral Once PRN Lequita Asal, MD      . ipratropium-albuterol (DUONEB) 0.5-2.5 (3) MG/3ML nebulizer solution 3 mL  3 mL Nebulization Q4H PRN Lance Coon, MD   3 mL at 02/14/15 0618  . methylPREDNISolone sodium succinate (SOLU-MEDROL) 40 mg/mL injection 40 mg  40 mg Intravenous Q12H Epifanio Lesches, MD   40 mg at 02/16/15 0319  .  nicotine (NICODERM CQ - dosed in mg/24 hours) patch 21 mg  21 mg Transdermal Daily Lance Coon, MD   21 mg at 02/16/15 0846  . ondansetron (ZOFRAN) tablet 4 mg  4 mg Oral Q6H PRN Lance Coon, MD       Or  . ondansetron [facility]) injection 4 mg  4 mg Intravenous Q6H PRN Lance Coon, MD         Review of Systems A multi-point review of systems was asked and was negative except for the positive findings listed in the history of present illness  Physical Exam Blood pressure 131/85, pulse 85, temperature 98 F (36.7 C), temperature source Oral, resp. rate 18, height '5\' 4"'$  (1.626 m), weight 63.05 kg (139 lb), SpO2 96 %. CONSTITUTIONAL: Resting in bed in no acute distress. EYES: Pupils are  equal, round, and reactive to light, Sclera are non-icteric. EARS, NOSE, MOUTH AND THROAT: The oropharynx is clear. The oral mucosa is pink and moist. Hearing is intact to voice. LYMPH NODES:  Lymph nodes in the neck are normal. RESPIRATORY:  Lungs are clear. There is normal respiratory effort, with equal breath sounds bilaterally, and without pathologic use of accessory muscles. CARDIOVASCULAR: Heart is regular without murmurs, gallops, or rubs. GI: The abdomen is mildly distended but soft, nontender. There are no palpable masses. There is no hepatosplenomegaly. There are normal bowel sounds in all quadrants. GU: Rectal deferred.   MUSCULOSKELETAL: Normal muscle strength and tone. No cyanosis or edema.   SKIN: Turgor is good and there are no pathologic skin lesions or ulcers. NEUROLOGIC: Motor and sensation is grossly normal. Cranial nerves are grossly intact. PSYCH:  Oriented to person, place and time. Affect is normal.  Data Reviewed Colonoscopy/images/labs reviewed. All concerning for metastatic colon cancer. Large tumor visualized on colonoscopy that is nearly obstructing but not currently obstructing. Multiple areas of likely metastatic disease seen on CT and MRI. Labs concerning for elevated tumor markers of CEA and CA-125. I have personally reviewed the patient's imaging, laboratory findings and medical records.    Assessment    64 year old female with likely stage IV colon cancer    Plan    Had a long conversation with the patient about the role of surgery in stage IV colon cancer. Stated that the 2 primary roles of surgery at this point are for insertion of an indwelling catheter for chemotherapy and possible palliative surgery for bowel obstruction from her tumor. Stated this that she is not currently obstructed do not see any indication for an emergent bowel resection and ostomy creation at this time. Discussed with her that chemotherapy is her option however it is still her  choice. Patient seemed reluctant to say whether or not she wanted chemotherapy at this time. Discussed that should she choose chemotherapy the placement of a port makes the process easier on her and the oncologist. However, if she questions whether not she wants chemotherapy she needs to make this decision prior to having a port placed because there is no reason to undergo the risk of a central access if she is not interested in chemotherapy. I told her to think about what her wishes are and how she wants to proceed and that I would continue to come and see her while she is in the hospital. If she decides to proceed with chemotherapy a port can be placed this week.     Time spent with the patient was 60 minutes, with more than 50% of the time spent in face-to-face  education, counseling and care coordination.     Clayburn Pert 02/16/2015, 10:18 AM

## 2015-02-16 NOTE — Progress Notes (Signed)
[facility]  Date of admission:  02/12/2015  Inpatient day:  02/15/2015  Consulting physician:  Dr. Lance Coon   Chief Complaint: Brandy Odom is a 64 y.o. female with a greater than 50 pack year smoking history who was admitted through the emergency room with shortness of breath and imaging studies revealing metastatic disease.  Subjective:  Diffuse bone pain well controlled.  Less anxious.  Tired secondary to multiple procedures.  Past Medical History  Diagnosis Date  . Tobacco abuse   . Muscle spasm   . Cancer [facility])     Past Surgical History  Procedure Laterality Date  . Appendectomy      Family History  Problem Relation Age of Onset  . Colon cancer      Social History:  reports that she has been smoking Cigarettes.  She does not have any smokeless tobacco history on file. She reports that she does not drink alcohol or use illicit drugs.  She lives in [place] with her husband, [name],  who accompanies her today.  Allergies:  Allergies  Allergen Reactions  . Bee Venom Anaphylaxis  . Naproxen Nausea And Vomiting  . Sulfa Antibiotics Other (See Comments)    Reaction:  Unknown   . Antihistamines, Chlorpheniramine-Type Palpitations    Medications Prior to Admission  Medication Sig Dispense Refill  . azithromycin (ZITHROMAX) 250 MG tablet Take 250-500 mg by mouth daily. Pt is to take two tablets on day 1 and one tablet for the next four days.    . diazepam (VALIUM) 5 MG tablet Take 1 tablet (5 mg total) by mouth every 8 (eight) hours as needed for muscle spasms. (Patient not taking: Reported on 02/12/2015) 12 tablet 0  . HYDROcodone-acetaminophen (NORCO) 5-325 MG tablet Take 1 tablet by mouth every 4 (four) hours as needed for moderate pain. (Patient not taking: Reported on 02/12/2015) 20 tablet 0  . predniSONE (STERAPRED UNI-PAK 21 TAB) 10 MG (21) TBPK tablet 6 tablets on day 1, 5 tablets on day 2, 4 tablets on day 3, etc... (Patient not taking:  Reported on 02/12/2015) 21 tablet 0  . traMADol (ULTRAM) 50 MG tablet Take 1 tablet (50 mg total) by mouth every 6 (six) hours as needed. (Patient not taking: Reported on 02/12/2015) 12 tablet 0    Review of Systems: GENERAL:  Fatigue.  On/off low grade temperature.  No sweats.  Weight loss of 28 pounds since 12/2014. PERFORMANCE STATUS (ECOG):  2 HEENT:  Diplopia on right lateral gaze.  No runny nose, sore throat, mouth sores or tenderness. Lungs:  Shortness of breath.  Cough.  No hemoptysis. Cardiac:  Chest/rib pain (stable).  No palpitations, orthopnea, or PND. GI:  Poor appetite.  Nausea.  Constipation.  No vomiting, diarrhea,melena or hematochezia. GU:  No urgency, frequency, dysuria, or hematuria. Musculoskeletal: Diffuse bone pain (ribs, back, legs), improved.  No muscle tenderness. Extremities:  No swelling. Skin:  No rashes or skin changes. Neuro:  No headache, numbness or weakness, balance or coordination issues. Endocrine:  No diabetes, thyroid issues, hot flashes or night sweats. Psych:  Anxiety. Pain:  Bone pain. Review of systems:  All other systems reviewed and found to be negative.  Physical Exam:  Blood pressure 131/85, pulse 85, temperature 98 F (36.7 C), temperature source Oral, resp. rate 18, height 5' 4" (1.626 m), weight 139 lb (63.05 kg), SpO2 96 %.  GENERAL:  Chronically fatigued appearing woman sitting comfortably on the medical unit in no acute distress. MENTAL STATUS:  Alert and oriented to person, place and time. HEAD:  Red shoulder length hair with dark roots.  Normocephalic, atraumatic, face symmetric, no Cushingoid features. EYES:  Brown eyes.  False eyelashes.  No conjunctivitis or scleral icterus. PSYCH:  Appropriate.  Results for orders placed or performed during the hospital encounter of 02/12/15 (from the past 48 hour(s))  Lactate dehydrogenase     Status: Abnormal   Collection Time: 02/14/15  6:39 AM  Result Value Ref Range   LDH 368 (H) 98 -  192 U/L  Uric acid     Status: None   Collection Time: 02/14/15  6:39 AM  Result Value Ref Range   Uric Acid, Serum 6.1 2.3 - 6.6 mg/dL  Protein electrophoresis, serum     Status: Abnormal   Collection Time: 02/14/15  6:39 AM  Result Value Ref Range   Total Protein ELP 5.7 (L) 6.0 - 8.5 g/dL   Albumin ELP 2.9 2.9 - 4.4 g/dL   Alpha-1-Globulin 0.2 0.0 - 0.4 g/dL   Alpha-2-Globulin 0.9 0.4 - 1.0 g/dL   Beta Globulin 1.0 0.7 - 1.3 g/dL   Gamma Globulin 0.7 0.4 - 1.8 g/dL   M-Spike, % Not Observed Not Observed g/dL   SPE Interp. Comment     Comment: (NOTE) The SPE pattern appears essentially unremarkable. Evidence of monoclonal protein is not apparent. Performed At: [facility] [place], [place] [id number] Lindon Romp MD IW:[id number]    Comment Comment     Comment: (NOTE) Protein electrophoresis scan will follow via computer, mail, or courier delivery.    GLOBULIN, TOTAL 2.8 2.2 - 3.9 g/dL   A/G Ratio 1.0 0.7 - 1.7  Kappa/lambda light chains     Status: Abnormal   Collection Time: 02/14/15  6:39 AM  Result Value Ref Range   Kappa free light chain 10.83 3.30 - 19.40 mg/L   Lamda free light chains 5.67 (L) 5.71 - 26.30 mg/L   Kappa, lamda light chain ratio 1.91 (H) 0.26 - 1.65    Comment: (NOTE) Performed At: [facility] [place], [place] [id number] Lindon Romp MD [id number]   IgG, IgA, IgM     Status: Abnormal   Collection Time: 02/14/15  6:39 AM  Result Value Ref Range   IgG (Immunoglobin G), Serum 593 (L) 700 - 1600 mg/dL   IgA 121 87 - 352 mg/dL   IgM, Serum 170 26 - 217 mg/dL    Comment: (NOTE) Performed At: [facility] [address] [place], [place] [id number] Lindon Romp MD DX:[id number]   CEA     Status: Abnormal   Collection Time: 02/14/15  6:39 AM  Result Value Ref Range   CEA 338.1 (H) 0.0 - 4.7 ng/mL    Comment: (NOTE)       Roche ECLIA methodology       Nonsmokers  <3.9                                      Smokers     <5.6 Performed At: [facility] [place], [place] [id number] Lindon Romp MD EZ:[id number]   Hepatitis B surface antigen     Status: None   Collection Time: 02/14/15  6:39 AM  Result Value Ref Range   Hepatitis B Surface Ag Negative Negative    Comment: (NOTE) Performed At: [facility] [address]  [address] [place], [place] [id number] Lindon Romp MD ZE:[id number]   Hepatitis B core antibody, total     Status: Abnormal   Collection Time: 02/14/15  6:39 AM  Result Value Ref Range   Hep B Core Total Ab Positive (A) Negative    Comment: (NOTE) Performed At: [facility] [place], [place] [id number] Lindon Romp MD YB:[id number]   Hepatitis C antibody     Status: None   Collection Time: 02/14/15  6:39 AM  Result Value Ref Range   HCV Ab <0.1 0.0 - 0.9 s/co ratio    Comment: (NOTE)                                  Negative:     < 0.8                             Indeterminate: 0.8 - 0.9                                  Positive:     > 0.9 The CDC recommends that a positive HCV antibody result be followed up with a HCV Nucleic Acid Amplification test (768115). Performed At: [facility] [place], [place] [id number] Lindon Romp MD RC:[id number]   Protime-INR     Status: Abnormal   Collection Time: 02/15/15  3:59 AM  Result Value Ref Range   Prothrombin Time 15.7 (H) 11.4 - 15.0 seconds   INR 1.23   CBC with Differential/Platelet     Status: Abnormal   Collection Time: 02/16/15  4:54 AM  Result Value Ref Range   WBC 8.0 3.6 - 11.0 K/uL   RBC 4.47 3.80 - 5.20 MIL/uL   Hemoglobin 12.2 12.0 - 16.0 g/dL   HCT 36.4 35.0 - 47.0 %   MCV 81.5 80.0 - 100.0 fL   MCH 27.3 26.0 - 34.0 pg   MCHC 33.5 32.0 - 36.0 g/dL   RDW 17.1 (H) 11.5 - 14.5 %   Platelets 176 150 - 440 K/uL   Neutrophils Relative % 84 %   Neutro Abs 6.7 (H) 1.4 - 6.5 K/uL   Lymphocytes  Relative 10 %   Lymphs Abs 0.8 (L) 1.0 - 3.6 K/uL   Monocytes Relative 5 %   Monocytes Absolute 0.4 0.2 - 0.9 K/uL   Eosinophils Relative 1 %   Eosinophils Absolute 0.0 0 - 0.7 K/uL   Basophils Relative 0 %   Basophils Absolute 0.0 0 - 0.1 K/uL  Creatinine, serum     Status: None   Collection Time: 02/16/15  4:54 AM  Result Value Ref Range   Creatinine, Ser 0.77 0.44 - 1.00 mg/dL   GFR calc non Af Amer >60 >60 mL/min   GFR calc Af Amer >60 >60 mL/min    Comment: (NOTE) The eGFR has been calculated using the CKD EPI equation. This calculation has not been validated in all clinical situations. eGFR's persistently <60 mL/min signify possible Chronic Kidney Disease.    Nm Bone Scan Whole Body  02/15/2015  CLINICAL DATA:  Colonic lesion and multiple bone lesions. EXAM: NUCLEAR MEDICINE WHOLE BODY BONE SCAN TECHNIQUE: Whole body anterior and posterior images were obtained approximately 3 hours after intravenous injection of radiopharmaceutical. RADIOPHARMACEUTICALS:  23.09 mCi Technetium-42mMDP IV  COMPARISON:  Chest CT 02/12/2015 and abdominal CT 02/14/2015 FINDINGS: Innumerable abnormal foci throughout the calvarium. Numerous abnormal foci involving bilateral ribs and multiple vertebral bodies. The most prominent vertebral body uptake is at L2 and T11. Subtle abnormal foci in the femurs bilaterally. Markedly increased uptake involving the proximal left humerus. Cannot exclude abnormal uptake in the right shoulder region. Multiple abnormal foci in the pelvis. Abnormal uptake involving the lateral left clavicle. IMPRESSION: Abnormal uptake throughout the calvarium, ribs, vertebral bodies, pelvis, bilateral femurs and shoulders. 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 Electronically Signed   By: Markus Daft M.D.   On: 02/15/2015 15:53   Ct Abdomen Pelvis W Contrast  02/14/2015  CLINICAL DATA:  64 year old female with recently discrete overt bone lesions concerning for potential  multiple myeloma versus metastatic disease. Mediastinal adenopathy, concerning for potential lymphoma versus metastatic disease. Further evaluation to search for potential primary malignancy. EXAM: CT ABDOMEN AND PELVIS WITH CONTRAST TECHNIQUE: Multidetector CT imaging of the abdomen and pelvis was performed using the standard protocol following bolus administration of intravenous contrast. CONTRAST:  18m OMNIPAQUE IOHEXOL 300 MG/ML  SOLN COMPARISON:  Chest CT 02/12/2015. CT of the abdomen and pelvis 04/27/2009 not available for comparison (could not be retrieved from [employer]. CT the abdomen and pelvis 12/04/2006 is available for comparison. FINDINGS: Lower chest: Previously noted 8 x 5 mm nodule in the periphery of the left lower lobe (image 7 of series 4) is unchanged compared to the recent prior chest CT. Hepatobiliary: 1.6 x 1.2 cm hypovascular lesion in segment 5 of the liver (image 33 of series 2) adjacent to the gallbladder fossa is indeterminate. No intra or extrahepatic biliary ductal dilatation. Gallbladder is normal in appearance. Pancreas: No pancreatic mass. No pancreatic ductal dilatation. No pancreatic or peripancreatic fluid or inflammatory changes. Spleen: Sub cm low-attenuation lesion in the superior aspect of the spleen is too small to definitively characterize, but is favored to represent a small cyst. Adrenals/Urinary Tract: Bilateral adrenal nodules are noted, measuring 1.4 x 2.2 cm on the right and 2.0 x 2.5 cm on the left, highly concerning for metastatic lesions. Sub cm low-attenuation lesions in both kidneys are too small to definitively characterize, but favored to represent cysts. Exophytic 8 mm high attenuation lesion (63 HU on portal venous phase imaging and 64 HU on delayed imaging) is incompletely characterized, but favored to represent a proteinaceous/hemorrhagic cyst. No hydroureteronephrosis. Urinary bladder is normal in appearance. Stomach/Bowel: The appearance of the  stomach is normal. No pathologic dilatation of small bowel or colon. In the medial aspect of the cecum there is mass-like soft tissue thickening, best appreciated on axial image 49 of series 2 and coronal image 61 of series 5, measuring approximately 3.7 x 3.2 x 3.7 cm. There is haziness in the surrounding pericolonic fat, and adjacent lymphadenopathy which appears contiguous with this apparent colonic mass. The largest lymph node is in the ileocolic mesentery on axial image 44 of series 2 and coronal image 58 of series 5, measuring 2.4 x 2.9 x 3.8 cm. Normal appendix. Vascular/Lymphatic: Atherosclerosis throughout the abdominal and pelvic vasculature, without evidence of aneurysm or dissection. As discussed above there is significant ileocolic lymphadenopathy. Multiple prominent borderline enlarged retroperitoneal lymph nodes are noted, measuring up to 7 mm in short axis immediately anterior to the lower pole of the right kidney. Mildly enlarged lymph node adjacent to the superior mesenteric vein (image 36 of series 2) measuring 1 cm in short axis. Multiple other borderline enlarged mesenteric  lymph nodes are also noted, which are conspicuous but nonspecific. Reproductive: Uterus is unremarkable in appearance. Multiple well-defined low to intermediate attenuation lesions in the ovaries bilaterally (right greater than left), measuring up to 2.4 cm in diameter in the inferior aspect of the right ovary, unusual in a postmenopausal patient. Coarse calcification in the left ovary anteriorly. Other: Trace volume of ascites in the low anatomic pelvis. No pneumoperitoneum. Musculoskeletal: Numerous mixed lytic and sclerotic lesions are noted throughout the visualized axial and appendicular skeleton, concerning for widespread metastatic disease to the bones. The largest of these involves the superior endplate of L4 on the right side measuring 3.0 x 1.8 cm (image 39 of series 2) where there is a pathologic compression with  approximately 25% loss of height focally. IMPRESSION: 1. 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 2. In addition, there are multiple low to intermediate attenuation cystic appearing lesions in the ovaries bilaterally. This appearance is unusual in a postmenopausal female. Further evaluation with nonemergent pelvic ultrasound is suggested in the near future sure further characterization. This recommendation follows ACR consensus guidelines: White Paper of the ACR Incidental Findings Committee II on Adnexal Findings. J Am Coll Radiol 2013:10:675-681. 3. Extensive atherosclerosis. Electronically Signed   By: Vinnie Langton M.D.   On: 02/14/2015 12:18   Mr Darnelle Catalan Wo/w Cm  02/15/2015  CLINICAL DATA:  Multiple myeloma. Soft tissue lesion in the left orbit. Abnormal CT scan. EXAM: MRI OF THE ORBITS WITHOUT AND WITH CONTRAST TECHNIQUE: Multiplanar, multisequence MR imaging of the orbits was performed both before and after the administration of intravenous contrast. CONTRAST:  22m MULTIHANCE GADOBENATE DIMEGLUMINE 529 MG/ML IV SOLN COMPARISON:  With with right severe duodenum MRI for this lesion retrospect upon CT which could items curious the cut were this is would is but FINDINGS: The lesion on the CT scan is centered within the left superior oblique muscle belly. A focal area of enhancement measures 7.0 x 10.5 x 9.5 mm. Multiple focal areas of enhancement are present within the brain. Lesion along the inferior left paramedian vermis measures 5.4 mm on image 5 of series 15. Punctate lateral cerebellar lesions are present  bilaterally. A right paramedian superior vermis lesion is evident on image 5 of series 13. A punctate lesion is present in the left thalamus. There is a punctate lesion near the left caudate head. A lesion at the left globus pallidus measures 3 mm. A 5 mm anterior right frontal lobe lesion is present. There is thickening and enhancement of the pituitary infundibulum. The optic chiasm is within normal limits. The globes are within normal limits. No other focal lesions are present. The superior ophthalmic veins are within normal limits bilaterally. The optic nerves are unremarkable. The rectus musculature is otherwise within normal limits. Multiple calvarial lesions are present with heterogeneous enhancement throughout the calvarium. IMPRESSION: 1. 7.0 x 10.5 x 9.5 mm lesion within the belly of the left superior oblique muscle is most compatible with a focal metastasis given multiple other brain lesions. 2. Multiple focal areas enhancement involving the cerebellum bilaterally, the left basal ganglia and thalamus, and the anteromedial right frontal lobe. These are also concerning for metastatic disease. 3. Thickening and enhancement of  the pituitary infundibulum. This can be seen with lymphoma but also with other metastatic disease. 4. The globes and orbits are otherwise unremarkable. 5. Diffuse osseous lesions. This may be related to metastatic disease or the patient's known multiple myeloma. Electronically Signed   By: San Morelle M.D.   On: 02/15/2015 14:48    Assessment:  The patient is a 64 y.o.  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  She has a greater than 50 pack year smoking history.  She presented with a 28 pound weight loss, nausea, constipation, diplopia on lateral gaze, and diffuse bone pain.    Initial images reveal mixed lucent , sclerotic lesions, and expansile lesions in the spine, ribs, and sternum without clear primary.  She had mediastinal adenopathy possibly consistent with  lymphoma versus metastatic disease.    Abdominal/pelvic CT scan on 02/14/2015  was worrisome for a primary colonic neoplasm in the cecum with associated ileocolic lymphadenopathy and metastatic disease to adrenals bilaterally as well as a suspicious lesion in segment 5 of the liver.  There was widespread osseous metastasis.  There were multiple low to intermediate attenuation cystic appearing lesions in the ovaries.  Head CT revealed a small ovoid soft tissue lesion within the medial aspect of the left orbit just superior to the medial rectus muscle.  Orbital MRI on 02/15/2015 revealed a 7x10x9 mm lesion on the left superior oblique muscle and multiple focal areas of enhancement (cerebellum, left basal ganglia, thalamus, and anteromedial right frontal lobe) concerning for metastatic disease.  Bone scan on 02/15/2015 revealed abnormal uptake throughout the calvarium, ribs, vertebral bodies, pelvis, bilateral femurs and shoulders consistent with metastatic disease.   Colonoscopy on 02/15/2015 revealed a frond-like/villous partially obstructing non-circumferential mass in the cecum.  Biopsies are pending.  CEA was 333.8 on 02/14/2015.  Labs reveal no evidence of myeloma (SPEP, free light chains).  24 hour urine pending, but suspect will be normal secondary to no proteinuria on urinalysis and normal creatinine and calcium.  There is no evidence of an aggressive lymphoma.  Serologies reveal prior hepatitis B infection.  Symptomatically, her bone pain is well controlled on Dilaudid IV.  Plan:   1.  Discuss results of imaging and colonoscopy.  Discuss metastatic colon cancer.  Await final pathology.  Preliminary discussions regarding chemotherapy. 2.  Plain films of femurs and shoulders to assess cortex integrity. 3.  Radiology contacted regarding reformatting orbital MRI (ordered as head MRI) to fullly assess metastatic disease. 4.  Consult radiation oncology. 5.  Consult surgery.  Discuss port-a-cath  placement. 6.  Pelvic ultrasound 02/16/2015 to assess cystic lesions on ovaries.  Follow-up CA125. 7.  Present at tumor board on 02/18/2015.   Thank you for allowing me to participate in NAKITA SANTERRE 's care.  I will follow her closely with you while hospitalized and after discharge in the outpatient department.  Lequita Asal, MD  02/15/2015

## 2015-02-16 NOTE — Consult Note (Signed)
Except an outstanding is perfect of Radiation Oncology NEW PATIENT EVALUATION  Name: Brandy Odom  MRN: [id number]  Date:   02/12/2015     DOB: 07-02-1950   This 64 y.o. 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  REFERRING PHYSICIAN: No ref. provider found  CHIEF COMPLAINT:  Chief Complaint  Patient presents with  . Shortness of Breath  . Diplopia    DIAGNOSIS: The primary encounter diagnosis was Acute bronchitis, unspecified organism. Diagnoses of Lesion of lung, Orbital lesion, Bone lesion, Adenopathy, Cyst of ovary, Abnormal radionuclide bone scan, Abnormal radionuclide bone scan, Abnormal radionuclide bone scan, Abnormal radionuclide bone scan, and Cyst of ovary, unspecified laterality were also pertinent to this visit.   PREVIOUS INVESTIGATIONS:  MRI scan of brain reviewed, CT scans of chest abdomen and pelvis reviewed, bone scan reviewed Clinical notes reviewed Pathology report pending  HPI: Patient is a 64 year old female who presented to the [facility] emergency room with increasing shortness of breath. Initial imaging showed multiple areas of lucent and sclerotic bone lesions consistent with metastatic disease. She also had significant mediastinal adenopathy. Abdominal pelvic CT performed on November 27 showed probable primary: Carcinoma in the cecum with ileocolic lymphadenopathy. She also noted to have adrenal metastasis bilaterally. There was also suspicious lesion in the liver. She underwent MRI scan showing multiple lesions throughout the brain consistent with widespread brain metastasis. She had involvement of the cerebellum left basal ganglia thalmus and right frontal lobe. Bone scan also demonstrated significant areas of bone involvement including the calvarium ribs vertebral bodies pelvis shoulders and bilateral femurs. She underwent colonoscopy on 02/15/2015 showing a partially obstructed mass in  the cecum biopsy is pending. Her CEA was 333.8. I been asked to evaluate the patient for possible palliative treatment to her brain. She does claim to have some numbness in her right face also diplopia. She does have significant back pain no evidence of loss of motor or sensory function. She has good bowel and bladder control. She seen today in her hospital room for evaluation. Patient is currently being treated for MRSA found in her urine.  PLANNED TREATMENT REGIMEN: Palliative whole brain radiation  PAST MEDICAL HISTORY:  has a past medical history of Tobacco abuse; Muscle spasm; and Cancer ([place]).    PAST SURGICAL HISTORY:  Past Surgical History  Procedure Laterality Date  . Appendectomy      FAMILY HISTORY: family history includes Colon cancer in an other family member.  SOCIAL HISTORY:  reports that she has been smoking Cigarettes.  She does not have any smokeless tobacco history on file. She reports that she does not drink alcohol or use illicit drugs.  ALLERGIES: Bee venom; Naproxen; Sulfa antibiotics; and Antihistamines, chlorpheniramine-type  MEDICATIONS:  Current Facility-Administered Medications  Medication Dose Route Frequency Provider Last Rate Last Dose  . 0.9 %  sodium chloride infusion   Intravenous Continuous Epifanio Lesches, MD   Stopped at 02/16/15 1048  . acetaminophen (TYLENOL) tablet 650 mg  650 mg Oral Q6H PRN Lance Coon, MD       Or  . acetaminophen (TYLENOL) suppository 650 mg  650 mg Rectal Q6H PRN Lance Coon, MD      . benzonatate (TESSALON) capsule 200 mg  200 mg Oral TID PRN Lance Coon, MD   200 mg at 02/15/15 2251  . diazepam (VALIUM) tablet 2 mg  2 mg Oral Q6H PRN Epifanio Lesches, MD   2 mg at 02/16/15  0541  . enoxaparin (LOVENOX) injection 40 mg  40 mg Subcutaneous Q24H Lance Coon, MD   40 mg at 02/16/15 0034  . feeding supplement (ENSURE ENLIVE) (ENSURE ENLIVE) liquid 237 mL  237 mL Oral TID Epifanio Lesches, MD   237 mL at 02/16/15 0846   . HYDROcodone-acetaminophen (NORCO/VICODIN) 5-325 MG per tablet 1 tablet  1 tablet Oral Q4H PRN Lance Coon, MD   1 tablet at 02/15/15 2154  . HYDROmorphone (DILAUDID) injection 2 mg  2 mg Intravenous Q4H PRN Epifanio Lesches, MD   2 mg at 02/16/15 0720  . iohexol (OMNIPAQUE) 240 MG/ML injection 50 mL  50 mL Oral Once PRN Lequita Asal, MD      . ipratropium-albuterol (DUONEB) 0.5-2.5 (3) MG/3ML nebulizer solution 3 mL  3 mL Nebulization Q4H PRN Lance Coon, MD   3 mL at 02/14/15 0618  . methylPREDNISolone sodium succinate (SOLU-MEDROL) 40 mg/mL injection 40 mg  40 mg Intravenous Q12H Epifanio Lesches, MD   40 mg at 02/16/15 0319  . nicotine (NICODERM CQ - dosed in mg/24 hours) patch 21 mg  21 mg Transdermal Daily Lance Coon, MD   21 mg at 02/16/15 0846  . ondansetron (ZOFRAN) tablet 4 mg  4 mg Oral Q6H PRN Lance Coon, MD       Or  . ondansetron [facility]) injection 4 mg  4 mg Intravenous Q6H PRN Lance Coon, MD        ECOG PERFORMANCE STATUS:  2 - Symptomatic, <50% confined to bed  REVIEW OF SYSTEMS: Except for the above-stated neurologic findings and widespread bone pain Patient denies any weight loss, fatigue, weakness, fever, chills or night sweats. Patient denies any loss of vision, blurred vision. Patient denies any ringing  of the ears or hearing loss. No irregular heartbeat. Patient denies heart murmur or history of fainting. Patient denies any chest pain or pain radiating to her upper extremities. Patient denies any shortness of breath, difficulty breathing at night, cough or hemoptysis. Patient denies any swelling in the lower legs. Patient denies any nausea vomiting, vomiting of blood, or coffee ground material in the vomitus. Patient denies any stomach pain. Patient states has had normal bowel movements no significant constipation or diarrhea. Patient denies any dysuria, hematuria or significant nocturia. Patient denies any problems walking, swelling in the joints or loss  of balance. Patient denies any skin changes, loss of hair or loss of weight. Patient denies any excessive worrying or anxiety or significant depression. Patient denies any problems with insomnia. Patient denies excessive thirst, polyuria, polydipsia. Patient denies any swollen glands, patient denies easy bruising or easy bleeding. Patient denies any recent infections, allergies or URI. Patient "s visual fields have not changed significantly in recent time.    PHYSICAL EXAM: BP 131/85 mmHg  Pulse 85  Temp(Src) 98 F (36.7 C) (Oral)  Resp 18  Ht '5\' 4"'$  (1.626 m)  Wt 139 lb (63.05 kg)  BMI 23.85 kg/m2  SpO2 96% A well-developed female in NAD seen in her hospital bed. Crude visual fields are within normal range motor sensory and DTR levels are equal and symmetric in upper lower extremities. Proprioception is intact. Cranial nerves II through XII are grossly intact. Well-developed well-nourished patient in NAD. HEENT reveals PERLA, EOMI, discs not visualized.  Oral cavity is clear. No oral mucosal lesions are identified. Neck is clear without evidence of cervical or supraclavicular adenopathy. Lungs are clear to A&P. Cardiac examination is essentially unremarkable with regular rate and rhythm without murmur rub  or thrill. Abdomen is benign with no organomegaly or masses noted. Motor sensory and DTR levels are equal and symmetric in the upper and lower extremities. Cranial nerves II through XII are grossly intact. Proprioception is intact. No peripheral adenopathy or edema is identified. No motor or sensory levels are noted. Crude visual fields are within normal range.  LABORATORY DATA: Pathology report still pending    RADIOLOGY RESULTS: CT scans chest abdomen pelvis as well as MRI scan of brain and bone scan all reviewed compatible above-stated findings   IMPRESSION: Widespread metastatic disease probably from: Primary in 64 year old female with brain metastasis.  PLAN: At this time I to go ahead  with palliative radiation therapy to her whole brain. Based on her multiple areas of involvement would recommend whole brain radiation therapy to 3000 cGy in 10 fractions. Risks and benefits of treatment including cognitive decline hair loss skin reaction alteration of blood counts fatigue all were discussed in detail with the patient. I would start her on some Decadron during radiation therapy and have set up and ordered CT simulation of her whole brain for tomorrow. May discuss other areas of palliative radiation therapy depending on her pain threshold after starting her whole brain radiation and will discuss the case personally with medical oncology. Case will be presented at our weekly tumor conference this week.  I would like to take this opportunity for allowing me to participate in the care of your patient.Armstead Peaks., MD

## 2015-02-16 NOTE — Plan of Care (Signed)
Problem: Pain Managment: Goal: General experience of comfort will improve Outcome: Progressing Dilaudid for pain with relief. Up to the bathroom with one assist. [place] of pelvis in am. Continue to monitor

## 2015-02-17 ENCOUNTER — Encounter: Payer: Self-pay | Admitting: Gastroenterology

## 2015-02-17 ENCOUNTER — Inpatient Hospital Stay
Admit: 2015-02-17 | Discharge: 2015-02-17 | Disposition: A | Discharge status: Home / Self Care | Payer: Self-pay | Attending: Radiation Oncology | Admitting: Radiation Oncology

## 2015-02-17 ENCOUNTER — Ambulatory Visit: Payer: Self-pay | Admitting: Family Medicine

## 2015-02-17 ENCOUNTER — Ambulatory Visit: Payer: Medicaid Other

## 2015-02-17 LAB — CULTURE, BLOOD (ROUTINE X 2): Culture: NO GROWTH

## 2015-02-17 MED ORDER — FENTANYL 12 MCG/HR TD PT72
12.5000 ug | MEDICATED_PATCH | TRANSDERMAL | Status: DC
  Administered 2015-02-17: 12.5 ug via TRANSDERMAL
  Filled 2015-02-17: qty 1

## 2015-02-17 MED ORDER — PREDNISONE 50 MG PO TABS
50.0000 mg | ORAL_TABLET | Freq: Every day | ORAL | Status: DC
  Administered 2015-02-18 – 2015-02-19 (×2): 50 mg via ORAL
  Filled 2015-02-17 (×2): qty 1

## 2015-02-17 MED ORDER — FENTANYL 25 MCG/HR TD PT72
25.0000 ug | MEDICATED_PATCH | TRANSDERMAL | Status: DC
  Administered 2015-02-17: 11:00:00 25 ug via TRANSDERMAL
  Filled 2015-02-17: qty 1

## 2015-02-17 MED ORDER — HYDROMORPHONE HCL 1 MG/ML IJ SOLN
2.0000 mg | INTRAMUSCULAR | Status: DC | PRN
  Administered 2015-02-17 – 2015-02-19 (×16): 2 mg via INTRAVENOUS
  Filled 2015-02-17 (×7): qty 2
  Filled 2015-02-17: qty 1
  Filled 2015-02-17 (×8): qty 2
  Filled 2015-02-17: qty 1

## 2015-02-17 NOTE — Progress Notes (Addendum)
[facility] NAME: Brandy Odom    MR#:  [id number]  DATE OF BIRTH:  11/13/1950  SUBJECTIVE: Admitted for chronic cough, persistent shortness of breath. Noted to have a weight loss of 20 pounds in last 3 months. Found to have metastatic colon  cancer. She says she does not want chemotherapy and wanted to be comfortable , she wanted to see palliative care physician. Again she told me today that she does not want chemotherapy. Requesting IV Dilaudid more often. Started on fentanyl patch last night.   CHIEF COMPLAINT:   Chief Complaint  Patient presents with  . Shortness of Breath  . Diplopia    REVIEW OF SYSTEMS:   Review of Systems  Constitutional: Negative for fever and chills.  HENT: Negative for hearing loss.   Eyes: Negative for blurred vision, double vision and photophobia.  Respiratory: Positive for cough and shortness of breath. Negative for hemoptysis.   Cardiovascular: Negative for palpitations, orthopnea and leg swelling.  Gastrointestinal: Negative for vomiting, abdominal pain and diarrhea.  Genitourinary: Negative for dysuria and urgency.  Musculoskeletal: Negative for myalgias and neck pain.       Upper and lower back pain, chest pain all over the chest.  Skin: Negative for rash.  Neurological: Negative for dizziness, focal weakness, seizures, weakness and headaches.  Psychiatric/Behavioral: Negative for memory loss. The patient does not have insomnia.      DRUG ALLERGIES:   Allergies  Allergen Reactions  . Bee Venom Anaphylaxis  . Naproxen Nausea And Vomiting  . Sulfa Antibiotics Other (See Comments)    Reaction:  Unknown   . Antihistamines, Chlorpheniramine-Type Palpitations    VITALS:  Blood pressure 148/84, pulse 92, temperature 97.4 F (36.3 C), temperature source Oral, resp. rate 18, height 5' 4" (1.626 m), weight 63.05 kg (139 lb), SpO2 97 %.  PHYSICAL EXAMINATION:  GENERAL:  64 y.o.-year-old  patient lying in the bed with no acute distress.  EYES: Pupils equal, round, reactive to light and accommodation. No scleral icterus. Extraocular muscles intact.  HEENT: Head atraumatic, normocephalic. Oropharynx and nasopharynx clear.  NECK:  Supple, no jugular venous distention. No thyroid enlargement, no tenderness.  LUNGS: Normal breath sounds bilaterally, faint  wheezing present, rales,rhonchi or crepitation. No use of accessory muscles of respiration.  CARDIOVASCULAR: S1, S2 normal. No murmurs, rubs, or gallops.  ABDOMEN: Soft, nontender, nondistended. Bowel sounds present. No organomegaly or mass.  EXTREMITIES: No pedal edema, cyanosis, or clubbing.  NEUROLOGIC: Cranial nerves II through XII are intact. Muscle strength 5/5 in all extremities. Sensation intact. Gait not checked.  PSYCHIATRIC: The patient is alert and oriented x 3.  SKIN: No obvious rash, lesion, or ulcer.    LABORATORY PANEL:   CBC  Recent Labs Lab 02/16/15 0454  WBC 8.0  HGB 12.2  HCT 36.4  PLT 176   ------------------------------------------------------------------------------------------------------------------  Chemistries   Recent Labs Lab 02/13/15 0540 02/16/15 0454  NA 145  --   K 3.6  --   CL 111  --   CO2 28  --   GLUCOSE 121*  --   BUN 8  --   CREATININE 0.64 0.77  CALCIUM 9.6  --   MG 2.0  --   AST 37  --   ALT 29  --   ALKPHOS 125  --   BILITOT 0.5  --    ------------------------------------------------------------------------------------------------------------------  Cardiac Enzymes  Recent Labs Lab 02/12/15 1554  TROPONINI <0.03   ------------------------------------------------------------------------------------------------------------------  RADIOLOGY:  Dg Shoulder Right  02/16/2015  CLINICAL DATA:  Abnormal bone scan EXAM: RIGHT SHOULDER - 2+ VIEW COMPARISON:  Bone scan 02/15/2015 and CT scan 02/12/2015 FINDINGS: Three views of the right shoulder submitted. No  acute fracture or subluxation. No cortical breakthrough or cortical reaction. There is a lytic lesion in right scapula just medial to glenoid measures about 1.3 cm. IMPRESSION: No acute fracture or subluxation. No cortical breakthrough or cortical reaction. There is a lytic lesion in right scapula just medial to glenoid measures about 1.3 cm. Electronically Signed   By: Lahoma Crocker M.D.   On: 02/16/2015 08:13   Nm Bone Scan Whole Body  02/15/2015  CLINICAL DATA:  Colonic lesion and multiple bone lesions. EXAM: NUCLEAR MEDICINE WHOLE BODY BONE SCAN TECHNIQUE: Whole body anterior and posterior images were obtained approximately 3 hours after intravenous injection of radiopharmaceutical. RADIOPHARMACEUTICALS:  23.09 mCi Technetium-42mMDP IV COMPARISON:  Chest CT 02/12/2015 and abdominal CT 02/14/2015 FINDINGS: Innumerable abnormal foci throughout the calvarium. Numerous abnormal foci involving bilateral ribs and multiple vertebral bodies. The most prominent vertebral body uptake is at L2 and T11. Subtle abnormal foci in the femurs bilaterally. Markedly increased uptake involving the proximal left humerus. Cannot exclude abnormal uptake in the right shoulder region. Multiple abnormal foci in the pelvis. Abnormal uptake involving the lateral left clavicle. IMPRESSION: Abnormal uptake throughout the calvarium, ribs, vertebral bodies, pelvis, bilateral femurs and shoulders. 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 Electronically Signed   By: AMarkus DaftM.D.   On: 02/15/2015 15:53   [place]oreaTransvaginal Non-ob  02/16/2015  CLINICAL DATA:  Cyst seen on ovary. EXAM: TRANSABDOMINAL AND TRANSVAGINAL ULTRASOUND OF PELVIS TECHNIQUE: Both transabdominal and transvaginal ultrasound examinations of the pelvis were performed. Transabdominal technique was performed for global imaging of the pelvis including uterus, ovaries, adnexal regions, and pelvic cul-de-sac. It was necessary to proceed with endovaginal exam  following the transabdominal exam to visualize the endometrium and ovaries. COMPARISON:  CT scan of February 14, 2015. FINDINGS: Uterus Measurements: 6.9 x 4.0 x 2.8 cm. No fibroids or other mass visualized. Endometrium Thickness: 12.3 mm which is abnormally thickened for postmenopausal patient. 4 mm cystic areas seen in endometrium. Right ovary Measurements: 4.5 x 3.3 x 3.1 cm. Internal flow is seen on Doppler, and this is enlarged in size for postmenopausal patient. Left ovary Measurements: 5.0 x 3.1 x 2.9 cm. Internal flow is seen on Doppler, and this is enlarged in size for postmenopausal patient. Other findings No free fluid. IMPRESSION: Endometrial thickness (12 mm) is considered abnormal for an asymptomatic post-menopausal female. Endometrial sampling should be considered to exclude carcinoma. Ovaries appear enlarged and somewhat lobular in appearance, which is unusual for postmenopausal patient. Given the presence of probable metastatic lesions elsewhere in the body described on prior CT scan, the possibility of bilateral Krukenberg tumors in the ovaries cannot be excluded. MRI may be performed for further evaluation. Electronically Signed   By: JMarijo Conception M.D.   On: 02/16/2015 11:36   [place]Pelvis Complete  02/16/2015  CLINICAL DATA:  Cyst seen on ovary. EXAM: TRANSABDOMINAL AND TRANSVAGINAL ULTRASOUND OF PELVIS TECHNIQUE: Both transabdominal and transvaginal ultrasound examinations of the pelvis were performed. Transabdominal technique was performed for global imaging of the pelvis including uterus, ovaries, adnexal regions, and pelvic cul-de-sac. It was necessary to proceed with endovaginal exam following the transabdominal exam to visualize the endometrium and ovaries. COMPARISON:  CT scan of February 14, 2015. FINDINGS: Uterus Measurements: 6.9 x 4.0  x 2.8 cm. No fibroids or other mass visualized. Endometrium Thickness: 12.3 mm which is abnormally thickened for postmenopausal patient. 4 mm cystic  areas seen in endometrium. Right ovary Measurements: 4.5 x 3.3 x 3.1 cm. Internal flow is seen on Doppler, and this is enlarged in size for postmenopausal patient. Left ovary Measurements: 5.0 x 3.1 x 2.9 cm. Internal flow is seen on Doppler, and this is enlarged in size for postmenopausal patient. Other findings No free fluid. IMPRESSION: Endometrial thickness (12 mm) is considered abnormal for an asymptomatic post-menopausal female. Endometrial sampling should be considered to exclude carcinoma. Ovaries appear enlarged and somewhat lobular in appearance, which is unusual for postmenopausal patient. Given the presence of probable metastatic lesions elsewhere in the body described on prior CT scan, the possibility of bilateral Krukenberg tumors in the ovaries cannot be excluded. MRI may be performed for further evaluation. Electronically Signed   By: Marijo Conception, M.D.   On: 02/16/2015 11:36   Dg Shoulder Left  02/16/2015  CLINICAL DATA:  Abnormal bone scan. EXAM: LEFT SHOULDER - 2+ VIEW COMPARISON:  Bone scan 02/15/2015. FINDINGS: Rounded lucency in the proximal humerus in the region of bone scan abnormality is present consistent the lytic lesion. Questionable small lytic lesion in the acromion process of the left scapula. Given recent bone scan findings, findings consistent with metastatic disease. IMPRESSION: 1. Prominent lytic lesion in the proximal humerus.  No fracture. 2. Questionable tiny lytic lesion in the left acromion . Electronically Signed   By: Marcello Moores  Register   On: 02/16/2015 08:12   Dg Femur Min 2 Views Left  02/16/2015  CLINICAL DATA:  Abnormal radionuclide scan.  Rule out bone lesion. EXAM: LEFT FEMUR 2 VIEWS COMPARISON:  Bone scan 02/15/2015 FINDINGS: Negative for fracture. No lytic or sclerotic bone lesion. Bone scan reveals mild diffuse uptake in a patchy configuration throughout the left femur. No definite radiographic correlation. Left hip joint normal. IMPRESSION: Abnormal bone  scan in the left femur does not show lytic or sclerotic bone lesion or fracture. Findings remain concerning for metastatic disease or myeloma. Electronically Signed   By: Franchot Gallo M.D.   On: 02/16/2015 08:12   Dg Femur, Min 2 Views Right  02/16/2015  CLINICAL DATA:  Colonic lesion. Abnormal bone scan with multiple osseous lesions. EXAM: RIGHT FEMUR 2 VIEWS COMPARISON:  Bone scan 02/15/2015.  Pelvic CT 02/14/2015. FINDINGS: The mineralization and alignment are normal. There is no evidence of acute fracture or dislocation. No focal lytic or blastic lesions are identified within the right femur. Subtle blastic lesions in the right hemipelvis are better seen on recent CT. There are no significant arthropathic changes. IMPRESSION: No radiographic evidence of metastatic disease within the right femur or pathologic fracture. Given the predominately blastic nature of the osseous disease and adenopathy on prior CT, consider lymphoma. Electronically Signed   By: Richardean Sale M.D.   On: 02/16/2015 08:14   Mr Darnelle Catalan Wo/w Cm  02/15/2015  CLINICAL DATA:  Multiple myeloma. Soft tissue lesion in the left orbit. Abnormal CT scan. EXAM: MRI OF THE ORBITS WITHOUT AND WITH CONTRAST TECHNIQUE: Multiplanar, multisequence MR imaging of the orbits was performed both before and after the administration of intravenous contrast. CONTRAST:  47m MULTIHANCE GADOBENATE DIMEGLUMINE 529 MG/ML IV SOLN COMPARISON:  With with right severe duodenum MRI for this lesion retrospect upon CT which could items curious the cut were this is would is but FINDINGS: The lesion on the CT scan is centered within the  left superior oblique muscle belly. A focal area of enhancement measures 7.0 x 10.5 x 9.5 mm. Multiple focal areas of enhancement are present within the brain. Lesion along the inferior left paramedian vermis measures 5.4 mm on image 5 of series 15. Punctate lateral cerebellar lesions are present bilaterally. A right paramedian  superior vermis lesion is evident on image 5 of series 13. A punctate lesion is present in the left thalamus. There is a punctate lesion near the left caudate head. A lesion at the left globus pallidus measures 3 mm. A 5 mm anterior right frontal lobe lesion is present. There is thickening and enhancement of the pituitary infundibulum. The optic chiasm is within normal limits. The globes are within normal limits. No other focal lesions are present. The superior ophthalmic veins are within normal limits bilaterally. The optic nerves are unremarkable. The rectus musculature is otherwise within normal limits. Multiple calvarial lesions are present with heterogeneous enhancement throughout the calvarium. IMPRESSION: 1. 7.0 x 10.5 x 9.5 mm lesion within the belly of the left superior oblique muscle is most compatible with a focal metastasis given multiple other brain lesions. 2. Multiple focal areas enhancement involving the cerebellum bilaterally, the left basal ganglia and thalamus, and the anteromedial right frontal lobe. These are also concerning for metastatic disease. 3. Thickening and enhancement of the pituitary infundibulum. This can be seen with lymphoma but also with other metastatic disease. 4. The globes and orbits are otherwise unremarkable. 5. Diffuse osseous lesions. This may be related to metastatic disease or the patient's known multiple myeloma. Electronically Signed   By: San Morelle M.D.   On: 02/15/2015 14:48    EKG:   Orders placed or performed during the hospital encounter of 02/12/15  . ED EKG  . ED EKG    ASSESSMENT AND PLAN:    1 shortness of breath and hypoxia was multifactorial secondary to COPD exacerbation, improving Her COPD exacerbation she is on oxygen, Solu-Medrol, nebulizer.wean  off steroids,her cough  Sob now more likley are due to lung mets,   #2 . 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  Spine; possibly to the  ovaries with Krukenberg tumors by ultrasound. Patient is going for radiation of the brain tomorrow and possible evaluation for palliative radiation therapy. He does not want chemotherapy. Consult palliative care for pain management, end of life issues. Patient wants hospice at home but before hospice comes home,she wants to have private time with family, #3 Severe malnutrition  in the context of chronic illness; continue ensure enlive   4.MRSA WLN:[id number] colonization;no treatment Needed.UA on admission not abnormal. _0 #5 diffuse bone pains:  on fentanyl patch 12.5 every 72 hours,, increase it to 25 mcg q 72 Dilaudid 2 mg every 3 hours. D/w husband  Prognosis is poor : All the records are reviewed and case discussed with Care Management/Social Workerr. Management plans discussed with the patient, family and they are in agreement.  CODE STATUS: full  TOTAL TIME TAKING CARE OF THIS PATIENT: 35 minutes.   POSSIBLE D/C IN 1-2DAYS, DEPENDING ON CLINICAL CONDITION.   Epifanio Lesches M.D on 02/17/2015 at 10:42 AM  Between 7am to 6pm - Pager - [phone]  After 6pm go to [email or web address] - password [email or web address] [facility]  [facility]  Office  [phone]  CC: Primary care physician; No PCP Per Patient   Note: This dictation was prepared with Dragon dictation along with smaller phrase technology. Any transcriptional errors that result from this  process are unintentional.

## 2015-02-17 NOTE — Progress Notes (Signed)
Spoke with Dr. Manuella Ghazi about pt's uncontrolled pain.  Palliative care consult order for pain control.  Clarise Cruz, RN

## 2015-02-17 NOTE — Plan of Care (Signed)
Problem: Pain Managment: Goal: General experience of comfort will improve Outcome: Progressing Plan of care, progress to goals. 1. Pt very anxious, have difficultly accepting diagnosis.  Provided listening and confirmation of feelings. 2. Pt having difficulty getting adequate pain control.  Fentanyl patch ordered and applied. 3. Pt goal is to go home with pain medication. Dorna Bloom RN

## 2015-02-17 NOTE — Plan of Care (Addendum)
Pt was to have whole brain radiation today, however she declined.  Pt has continued to be anxious about new diagnosis, prn valium given and emotional support given to the pt.    Problem: Safety: Goal: Ability to remain free from injury will improve Outcome: Progressing Pt is high falls, call bell and phone within reach, bed alarm in use, hourly safety rounds  Problem: Pain Managment: Goal: General experience of comfort will improve Outcome: Progressing Pt c/o pain in her chest and back, prn med given with improvement.  Pt pain medication order changed to q 2hrs today, pain has been better controlled today

## 2015-02-17 NOTE — Progress Notes (Signed)
Nutrition Follow-up  DOCUMENTATION CODES:   Severe malnutrition in context of chronic illness  INTERVENTION:  Meals and snacks: Cater to pt preferences, requesting fresh fruit cup and on soft diet (low fiber) not allowed.  Recommend regular diet at this time for this pt to liberalize menu options.   Medical Nutrition Supplement Therapy: continue ensure for added nutrition.  Pt requesting homemade milkshakes and kitchen preparing for pt.  Encouraged to continue to drink for added nutrition   NUTRITION DIAGNOSIS:   Inadequate oral intake related to poor appetite as evidenced by per patient/family report, percent weight loss.    GOAL:   Patient will meet greater than or equal to 90% of their needs    MONITOR:    (Energy intake, Pulmonary profile)  REASON FOR ASSESSMENT:   Malnutrition Screening Tool    ASSESSMENT:      Palliative care following and noted pt has refused treatment for metastatic colon cancer   Current Nutrition: tolerating cold beverages, fruit, cottage cheese, milkshake and ensure   Gastrointestinal Profile: Last BM: 11/28   Scheduled Medications:  . enoxaparin (LOVENOX) injection  40 mg Subcutaneous Q24H  . feeding supplement (ENSURE ENLIVE)  237 mL Oral TID  . fentaNYL  25 mcg Transdermal Q72H  . nicotine  21 mg Transdermal Daily  . [START ON 02/18/2015] predniSONE  50 mg Oral Q breakfast       Electrolyte/Renal Profile and Glucose Profile:   Recent Labs Lab 02/12/15 1554 02/13/15 0540 02/16/15 0454  NA 143 145  --   K 3.2* 3.6  --   CL 108 111  --   CO2 27 28  --   BUN 8 8  --   CREATININE 0.69 0.64 0.77  CALCIUM 10.2 9.6  --   MG  --  2.0  --   PHOS  --  3.8  --   GLUCOSE 114* 121*  --    Protein Profile:  Recent Labs Lab 02/12/15 1554 02/13/15 0540  ALBUMIN 3.5 3.0*        Weight Trend since Admission: Filed Weights   02/12/15 1908 02/13/15 0032 02/15/15 1502  Weight: 145 lb 6 oz (65.942 kg) 139 lb 14.4 oz  (63.458 kg) 139 lb (63.05 kg)      Diet Order:  Diet regular Room service appropriate?: Yes; Fluid consistency:: Thin  Skin:   reviewed   Height:   Ht Readings from Last 1 Encounters:  02/15/15 '5\' 4"'$  (1.626 m)    Weight:   Wt Readings from Last 1 Encounters:  02/15/15 139 lb (63.05 kg)    Ideal Body Weight:     BMI:  Body mass index is 23.85 kg/(m^2).  Estimated Nutritional Needs:   Kcal:  BEE 1165 kcals (IF 1.0-1.2, AF 1.3) 7341-9379 kcals/d.   Protein:  (1.1-1.3 g/kg) 69-82 g/d  Fluid:  (25-39m/kg) 1575-18930md  EDUCATION NEEDS:   No education needs identified at this time  LOW Care Level  Tymir Terral B. AlZenia ResidesRDHubbard LakeLDHatchpager)

## 2015-02-17 NOTE — Progress Notes (Signed)
Pt reports pain of 9/10.  Requesting Dilaudid but too early to give.  Pt states the Norco does not help at all but agrees to take.  Pt complains of pain all along her spine and ribs and cannot find any position that is comfortable. Spoke with Dr Jannifer Franklin about providing her with additional pain medication.  Fentanyl ordered and explained to pt layering of pain medication.  Pt very concerned she will not be able to go home and she states that her goal is to go home with pain medication today. Dorna Bloom RN

## 2015-02-18 DIAGNOSIS — Z515 Encounter for palliative care: Secondary | ICD-10-CM

## 2015-02-18 DIAGNOSIS — C7951 Secondary malignant neoplasm of bone: Secondary | ICD-10-CM | POA: Insufficient documentation

## 2015-02-18 DIAGNOSIS — F419 Anxiety disorder, unspecified: Secondary | ICD-10-CM

## 2015-02-18 DIAGNOSIS — C7A021 Malignant carcinoid tumor of the cecum: Secondary | ICD-10-CM

## 2015-02-18 DIAGNOSIS — R05 Cough: Secondary | ICD-10-CM

## 2015-02-18 DIAGNOSIS — C7800 Secondary malignant neoplasm of unspecified lung: Secondary | ICD-10-CM

## 2015-02-18 DIAGNOSIS — Z79899 Other long term (current) drug therapy: Secondary | ICD-10-CM

## 2015-02-18 DIAGNOSIS — F17210 Nicotine dependence, cigarettes, uncomplicated: Secondary | ICD-10-CM

## 2015-02-18 DIAGNOSIS — D3A021 Benign carcinoid tumor of the cecum: Secondary | ICD-10-CM

## 2015-02-18 DIAGNOSIS — C7960 Secondary malignant neoplasm of unspecified ovary: Secondary | ICD-10-CM

## 2015-02-18 LAB — SURGICAL PATHOLOGY

## 2015-02-18 MED ORDER — GUAIFENESIN ER 600 MG PO TB12
600.0000 mg | ORAL_TABLET | Freq: Two times a day (BID) | ORAL | Status: DC
  Administered 2015-02-18: 600 mg via ORAL
  Filled 2015-02-18 (×3): qty 1

## 2015-02-18 NOTE — Plan of Care (Signed)
Problem: Pain Managment: Goal: General experience of comfort will improve Outcome: Progressing Plan of care, progress to goals. 1. Increased frequency of pain medications ordered today.  Pt with relief between doses every 2 hours. 2. Pt with increased cough and pain to sternum. Pt finds ice and cold drinks provide the best relief.  Declines tessalon. 3. Pt very verbal about her anxiety and fears of death.  Several staff members have sat with pt tonight and provided active listening. Offered to call chaplin but pt declined.  Recommended to pt that she speak with hospice about care at home. 4. Pt goal to go home with pain mediction today. Dorna Bloom RN

## 2015-02-18 NOTE — Progress Notes (Signed)
Dr. Mike Gip into see pt, pt and family wanting to pursue treatment, Dr. Adonis Huguenin notified of pt wanting to pursue chemo for port placement per Dr. Kem Parkinson request

## 2015-02-18 NOTE — Progress Notes (Signed)
CC: Metastatic cancer Subjective: 64 year old female with metastatic colon cancer. Had long discussion about treatment goals and options. Patient has finally decided that she wishes to pursue chemotherapy for now. She would like to have a port placed to make chemotherapy easier. She continues to have her pain complaints secondary to her metastatic cancer however this is improved with pain medication.  Objective: Vital signs in last 24 hours: Temp:  [97.6 F (36.4 C)-99 F (37.2 C)] 97.6 F (36.4 C) (12/01 1000) Pulse Rate:  [86-103] 98 (12/01 1000) Resp:  [18-20] 18 (12/01 1000) BP: (110-134)/(74-91) 110/91 mmHg (12/01 1000) SpO2:  [91 %-99 %] 98 % (12/01 1000) Last BM Date: 02/17/15  Intake/Output from previous day: 11/30 0701 - 12/01 0700 In: -  Out: 3550 [Urine:3550] Intake/Output this shift: Total I/O In: -  Out: 200 [Urine:200]  Physical exam:  Gen.: No acute distress Neck: Supple, no palpable lymphadenopathy, no scars from prior central access to any large vein Chest: Clear to auscultation Heart: Regular rate and rhythm Abdomen: Soft, nontender  Lab Results: CBC   Recent Labs  02/16/15 0454  WBC 8.0  HGB 12.2  HCT 36.4  PLT 176   BMET  Recent Labs  02/16/15 0454  CREATININE 0.77   PT/INR No results for input(s): LABPROT, INR in the last 72 hours. ABG No results for input(s): PHART, HCO3 in the last 72 hours.  Invalid input(s): PCO2, PO2  Studies/Results: No results found.  Anti-infectives: Anti-infectives    Start     Dose/Rate Route Frequency Ordered Stop   02/15/15 0200  vancomycin (VANCOCIN) 500 mg in sodium chloride 0.9 % 100 mL IVPB  Status:  Discontinued     500 mg 100 mL/hr over 60 Minutes Intravenous Every 12 hours 02/14/15 1400 02/15/15 1410   02/14/15 2000  vancomycin (VANCOCIN) IVPB 750 mg/150 ml premix  Status:  Discontinued     750 mg 150 mL/hr over 60 Minutes Intravenous Every 12 hours 02/14/15 1354 02/14/15 1400   02/14/15  1315  vancomycin (VANCOCIN) IVPB 1000 mg/200 mL premix     1,000 mg 200 mL/hr over 60 Minutes Intravenous  Once 02/14/15 1307 02/14/15 1449   02/14/15 1245  vancomycin (VANCOCIN) IVPB 1000 mg/200 mL premix  Status:  Discontinued     1,000 mg 200 mL/hr over 60 Minutes Intravenous Every 24 hours 02/14/15 1243 02/14/15 1307      Assessment/Plan:  64 year old female with metastatic colon cancer. Discussed the risks, benefits, alternatives undergoing a Chemo-Port insertion. The risks to include damaged artery, nerve, lung tissue: Need for additional surgical procedures to repair damage from any of those: Migration of port: Possible surgical site infection. Patient seemed to voice understanding and desires to proceed. Since she is in the process of eating and drinking during my visit plan will be for nothing by mouth after midnight and port placement tomorrow.  Tonantzin Mimnaugh T. Adonis Huguenin, MD, FACS  02/18/2015

## 2015-02-18 NOTE — Progress Notes (Signed)
[facility]  Date of admission:  02/12/2015  Inpatient day:  02/18/2015   Consulting physician:  Dr. Lance Coon   Chief Complaint: Brandy Odom is a 64 y.o. female with a greater than 50 pack year smoking history who was admitted through the emergency room with shortness of breath and imaging studies revealing metastatic disease.  Subjective:  Diffuse bone pain well controlled.  Past Medical History  Diagnosis Date  . Tobacco abuse   . Muscle spasm   . Cancer [facility])     Past Surgical History  Procedure Laterality Date  . Appendectomy    . Colonoscopy with propofol N/A 02/15/2015    Procedure: COLONOSCOPY WITH PROPOFOL;  Surgeon: Lucilla Lame, MD;  Location: [facility] ENDOSCOPY;  Service: Endoscopy;  Laterality: N/A;    Family History  Problem Relation Age of Onset  . Colon cancer      Social History:  reports that she has been smoking Cigarettes.  She does not have any smokeless tobacco history on file. She reports that she does not drink alcohol or use illicit drugs.  She lives in [place] with her husband, [name],  who accompanies her today.  Allergies:  Allergies  Allergen Reactions  . Bee Venom Anaphylaxis  . Naproxen Nausea And Vomiting  . Sulfa Antibiotics Other (See Comments)    Reaction:  Unknown   . Antihistamines, Chlorpheniramine-Type Palpitations    Medications Prior to Admission  Medication Sig Dispense Refill  . azithromycin (ZITHROMAX) 250 MG tablet Take 250-500 mg by mouth daily. Pt is to take two tablets on day 1 and one tablet for the next four days.    . diazepam (VALIUM) 5 MG tablet Take 1 tablet (5 mg total) by mouth every 8 (eight) hours as needed for muscle spasms. (Patient not taking: Reported on 02/12/2015) 12 tablet 0  . HYDROcodone-acetaminophen (NORCO) 5-325 MG tablet Take 1 tablet by mouth every 4 (four) hours as needed for moderate pain. (Patient not taking: Reported on 02/12/2015) 20 tablet 0  . predniSONE (STERAPRED  UNI-PAK 21 TAB) 10 MG (21) TBPK tablet 6 tablets on day 1, 5 tablets on day 2, 4 tablets on day 3, etc... (Patient not taking: Reported on 02/12/2015) 21 tablet 0  . traMADol (ULTRAM) 50 MG tablet Take 1 tablet (50 mg total) by mouth every 6 (six) hours as needed. (Patient not taking: Reported on 02/12/2015) 12 tablet 0    Review of Systems: GENERAL:  Fatigue.  No fever or sweats.  Weight loss of 28 pounds since 12/2014. PERFORMANCE STATUS (ECOG):  2 HEENT:  Diplopia on right lateral gaze.  No runny nose, sore throat, mouth sores or tenderness. Lungs:  Shortness of breath.  Cough.  No hemoptysis. Cardiac:  Chest/rib pain (improved).  No palpitations, orthopnea, or PND. GI:  Poor appetite.  Nausea.  Constipation.  No vomiting, diarrhea,melena or hematochezia. GU:  No urgency, frequency, dysuria, or hematuria. Musculoskeletal: Diffuse bone pain (ribs, back, legs), improved.  No muscle tenderness. Extremities:  No swelling. Skin:  No rashes or skin changes. Neuro:  No headache, numbness or weakness, balance or coordination issues. Endocrine:  No diabetes, thyroid issues, hot flashes or night sweats. Psych:  Anxiety. Pain:  Bone pain, improved. Review of systems:  All other systems reviewed and found to be negative.  Physical Exam:  Blood pressure 110/70, pulse 99, temperature 98.4 F (36.9 C), temperature source Oral, resp. rate 20, height '5\' 4"'$  (1.626 m), weight 139 lb (63.05 kg), SpO2 94 %.  GENERAL:  Chronically fatigued appearing woman sitting comfortably on the medical unit in no acute distress. MENTAL STATUS:  Alert and oriented to person, place and time. HEAD:  Red shoulder length hair with dark roots.  Normocephalic, atraumatic, face symmetric, no Cushingoid features. EYES:  Brown eyes.  False eyelashes.  No conjunctivitis or scleral icterus. ENT: Oropharynx clear without lesion. Tongue normal. Mucous membranes moist.  RESPIRATORY: Scattered rhonchi. No  wheezes. CARDIOVASCULAR: Regular rate and rhythm without murmur, rub or gallop. ABDOMEN: Soft, non-tender, with active bowel sounds, and no appreciable hepatosplenomegaly. No masses. SKIN: No rashes or ulcers. EXTREMITIES: No edema, no skin discoloration or tenderness. No palpable cords. LYMPH NODES: Right posterior neck 2 cm mobile cyst (old). No palpable cervical, supraclavicular, axillary or inguinal adenopathy  NEUROLOGICAL:  Stable. PSYCH: Appropriate.  No results found for this or any previous visit (from the past 48 hour(s)). No results found.  Assessment:  The patient is a 64 y.o.  woman with metastatic disease.  She has a greater than 50 pack year smoking history.  She presented with a 28 pound weight loss, nausea, constipation, diplopia on lateral gaze, and diffuse bone pain.    Initial images reveal mixed lucent , sclerotic lesions, and expansile lesions in the spine, ribs, and sternum without clear primary.  She had mediastinal adenopathy possibly consistent with lymphoma versus metastatic disease.    Abdominal/pelvic CT scan on 02/14/2015  was worrisome for a primary colonic neoplasm in the cecum with associated ileocolic lymphadenopathy and metastatic disease to adrenals bilaterally as well as a suspicious lesion in segment 5 of the liver.  There was widespread osseous metastasis.  There were multiple low to intermediate attenuation cystic appearing lesions in the ovaries.  Head CT revealed a small ovoid soft tissue lesion within the medial aspect of the left orbit just superior to the medial rectus muscle.  Orbital MRI on 02/15/2015 revealed a 7x10x9 mm lesion on the left superior oblique muscle and multiple focal areas of enhancement (cerebellum, left basal ganglia, thalamus, and anteromedial right frontal lobe) concerning for metastatic disease.  Bone scan on 02/15/2015 revealed abnormal uptake throughout the calvarium, ribs, vertebral bodies, pelvis, bilateral femurs and  shoulders consistent with metastatic disease.  Plain films reveal no evidence of impending fracture.  Colonoscopy on 02/15/2015 revealed a frond-like/villous partially obstructing non-circumferential mass in the cecum.  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  Pathology does not explain imaging studies.  CEA was 333.8 on 02/14/2015.  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  CA125 was 38.3 (0-38.1) on 02/15/2015.  Labs reveal no evidence of myeloma (SPEP, free light chains).  24 hour urine revealed < 22.4 mg/24 hours of an M spike.  There is no evidence of an aggressive lymphoma.  Serologies reveal prior hepatitis B infection.  Symptomatically, her bone pain is well controlled on Dilaudid IV.  Plan:   1.  Hematology/Oncology:  I discussed with the patient on 2 separate occasions today her thoughts about treatment.  Notes had previously indicated no plan for chemotherapy/treatment.  She stated that she did not want to be "really sick" with treatment.  In addition, she thought treatment might exclude treating her pain with pain medications.  I reassured her that managing her pain with medications could either be done with hospice or while undergoing treatment.  If treatment is effective, her pain medication needs would likely decrease.  We initially discussed treatment for metastatic colon cancer.  When her pathology from her colonoscopy became available, I discussed her low grade carcinoid tumor.  I explained that this would not cause brain metastasis and the extensive bony metastatic disease noted on imaging studies.  I discussed a posterior iliac biopsy via CT guidance to obtain tissue for diagnosis.  Once a diagnosis is available and treatment options discussed, she could then decide if she wanted treatment.  The patient felt comfortable with this plan.  A port-a-cath will temporarily be  postponed.  Lovenox will be held until after her biopsy tomorrow morning.  AM labs: CBC with diff and PT/INR.  Plain films of femurs and shoulders do not indicate any impending fracture.   Thank you for allowing me to participate in ALEXANDRYA CHIM 's care.  I will follow her closely with you while hospitalized and after discharge in the outpatient department.  Lequita Asal, MD  02/18/2015, 10:13 PM

## 2015-02-18 NOTE — Progress Notes (Signed)
Surgery interval update  Per results of cancer conference decision was made to proceed with bone biopsy prior to port placement. Metastatic pattern not consistent with a tissue diagnosis from colonoscopy (carcinoid).  Discussed with patient that the previously scheduled port placement and bone biopsy were scheduled for the same time. We will postpone port placement pending results of bone biopsy. Patient voiced understanding.  We'll continue to follow  Brandy Pert, MD [place] Surgeon [facility]

## 2015-02-18 NOTE — Progress Notes (Signed)
Palliative Care Update   Palliative Care Consult is initiated.  Pt desires chemo and port placement.  Will have supportive discussions and evaluate symptom management.  Full note to follow.   Colleen Can, MD

## 2015-02-18 NOTE — Plan of Care (Addendum)
Pt remains upset about new diagnosis, prn valium given with some improvement, pt has agreed to chemo and radiation and will receive a port tomorrow.  Pt has not really slept well since coming to the hospital, pt is afraid that if she sleeps, she might not wake back up.  Pt has taken short naps throughout this shift.  Problem: Safety: Goal: Ability to remain free from injury will improve Outcome: Progressing Pt is high falls, bed alarm in use, call bell and phone within reach, hourly safety rounds, pt calling for assistance when needed  Problem: Pain Managment: Goal: General experience of comfort will improve Outcome: Progressing Pt c/o pain in her chest and back, prn meds given with improvement, pt resting comfortably in bed, pt pain has been much more controlled this shift

## 2015-02-18 NOTE — Progress Notes (Signed)
[place] [facility] [place] NAME: Brandy Odom    MR#:  [id number]  DATE OF BIRTH:  May 06, 1950  SUBJECTIVE:   Patient has decided on chemotherapy Her goals she says is to be comfortable with hopes that chemo may help. She is c/o body pain esp rib pain and cough  REVIEW OF SYSTEMS:    Review of Systems  Constitutional: Positive for weight loss and malaise/fatigue. Negative for fever and chills.  HENT: Negative for sore throat.   Eyes: Negative for blurred vision.  Respiratory: Positive for cough. Negative for hemoptysis, shortness of breath and wheezing.   Cardiovascular: Negative for chest pain, palpitations and leg swelling.  Gastrointestinal: Negative for nausea, vomiting, abdominal pain, diarrhea and blood in stool.  Genitourinary: Negative for dysuria.  Musculoskeletal: Negative for back pain.  Neurological: Positive for weakness. Negative for dizziness, tremors and headaches.  Endo/Heme/Allergies: Does not bruise/bleed easily.    Tolerating Diet:yes      DRUG ALLERGIES:   Allergies  Allergen Reactions  . Bee Venom Anaphylaxis  . Naproxen Nausea And Vomiting  . Sulfa Antibiotics Other (See Comments)    Reaction:  Unknown   . Antihistamines, Chlorpheniramine-Type Palpitations    VITALS:  Blood pressure 110/91, pulse 98, temperature 97.6 F (36.4 C), temperature source Oral, resp. rate 18, height '5\' 4"'$  (1.626 m), weight 63.05 kg (139 lb), SpO2 98 %.  PHYSICAL EXAMINATION:   Physical Exam  Constitutional: She is oriented to person, place, and time and well-developed, well-nourished, and in no distress. No distress.  HENT:  Head: Normocephalic.  Eyes: No scleral icterus.  Neck: Normal range of motion. Neck supple. No JVD present. No tracheal deviation present.  Cardiovascular: Normal rate, regular rhythm and normal heart sounds.  Exam reveals no gallop and no friction rub.   No murmur heard. Pulmonary/Chest: Effort  normal and breath sounds normal. No respiratory distress. She has no wheezes. She has no rales. She exhibits no tenderness.  Rhonchi bilaterally  Abdominal: Soft. Bowel sounds are normal. She exhibits no distension and no mass. There is no tenderness. There is no rebound and no guarding.  Musculoskeletal: Normal range of motion. She exhibits no edema.  Neurological: She is alert and oriented to person, place, and time.  Skin: Skin is warm. No rash noted. No erythema.  Psychiatric: Affect and judgment normal.      LABORATORY PANEL:   CBC  Recent Labs Lab 02/16/15 0454  WBC 8.0  HGB 12.2  HCT 36.4  PLT 176   ------------------------------------------------------------------------------------------------------------------  Chemistries   Recent Labs Lab 02/13/15 0540 02/16/15 0454  NA 145  --   K 3.6  --   CL 111  --   CO2 28  --   GLUCOSE 121*  --   BUN 8  --   CREATININE 0.64 0.77  CALCIUM 9.6  --   MG 2.0  --   AST 37  --   ALT 29  --   ALKPHOS 125  --   BILITOT 0.5  --    ------------------------------------------------------------------------------------------------------------------  Cardiac Enzymes  Recent Labs Lab 02/12/15 1554  TROPONINI <0.03   ------------------------------------------------------------------------------------------------------------------  RADIOLOGY:  No results found.   ASSESSMENT AND PLAN:   64 y/o female admitted for shortness of breath and found to have metastatic colon cancer.   1. Acute respiratory failure with hypoxia: This is multifactorial secondary to probable COPD exacerbation (no formal dg made but long hx of smoking) along with metastatic colon  cancer to the adrenals, liver, lungs and spine. Patient's respiratory status is actually improved. She is off of oxygen and off of steroids.  2. Metastatic colon cancer: Metastatic diseases to adrenals, liver, lungs, spine and probable ovaries. Patient has been seen and  evaluated by oncology as well as radiation oncology. Patient will have chemotherapy started and brain radiation for palliative care. Dr. Adonis Huguenin is to place a chemotherapy port. Continue pain control.   3. MRSA urinary tract infection: This is thought to be due to colonization.   4. Severe malnutrition: This is in the context of critical illness continue and sugar.    Management plans discussed with the patient and she is in agreement.  CODE STATUS: FULL  TOTAL TIME TAKING CARE OF THIS PATIENT: 30 minutes.     POSSIBLE D/C 2-4 days, DEPENDING ON CLINICAL CONDITION.   Kru Allman M.D on 02/18/2015 at 11:21 AM  Between 7am to 6pm - Pager - [phone] After 6pm go to [email or web address] - password [email or web address] [facility]  [place] [facility]  Office  [phone]  CC: Primary care physician; No PCP Per Patient  Note: This dictation was prepared with Dragon dictation along with smaller phrase technology. Any transcriptional errors that result from this process are unintentional.

## 2015-02-18 NOTE — Consult Note (Signed)
Palliative Medicine Inpatient Consult Note   Name: Brandy Odom Date: 02/18/2015 MRN: [id number]  DOB: Aug 17, 1950  Referring Physician: Bettey Costa, MD  Palliative Care consult requested for this 64 y.o. female for goals of medical therapy in patient with metastatic colon cancer newly diagnosed after she presented with a 20 lb wt loss over 3 mos time as well as a chronic cough and persistent shortness of breath.  She has need of symptom management for pain and shortness of breath and has been getting IV dilaudid and a fentanyl patch has been added.  At first, she stated repeatedly that she did not want chemotherapy, but after learning more from Dr. Janece Canterbury this am, she is now choosing to opt for this. Dr Sharolyn Douglas plans to place a port tomorrow.     TODAY'S DISCUSSIONS AND DECISIONS Pt has decided to pursue chemo. She will need some symptom management and I will follow her for that. Our discussion did not get too deep into palliative issues as she wanted to talk more about symptoms/ anxiety issues. Will follow up with more supportive discussions. Port to be placed tomorrow for chemo to begin.   She should be made aware of options for Hospice care if / when chemo stops.  REVIEW OF SYSTEMS:  Positive for anxiety and shortness of breath and pain in abdomen and back.  No fever, chills, or vomiting or diarrhea currently.     SPIRITUAL SUPPORT SYSTEM: Yes.  SOCIAL HISTORY:  reports that she has been smoking Cigarettes.  She does not have any smokeless tobacco history on file. She reports that she does not drink alcohol or use illicit drugs.  LEGAL DOCUMENTS:  none  CODE STATUS: Full code  PAST MEDICAL HISTORY: Past Medical History  Diagnosis Date  . Tobacco abuse   . Muscle spasm   . Cancer [facility])     PAST SURGICAL HISTORY:  Past Surgical History  Procedure Laterality Date  . Appendectomy    . Colonoscopy with propofol N/A 02/15/2015    Procedure: COLONOSCOPY WITH  PROPOFOL;  Surgeon: Lucilla Lame, MD;  Location: [facility] ENDOSCOPY;  Service: Endoscopy;  Laterality: N/A;    ALLERGIES:  is allergic to bee venom; naproxen; sulfa antibiotics; and antihistamines, chlorpheniramine-type.  MEDICATIONS:  Current Facility-Administered Medications  Medication Dose Route Frequency Provider Last Rate Last Dose  . acetaminophen (TYLENOL) tablet 650 mg  650 mg Oral Q6H PRN Lance Coon, MD       Or  . acetaminophen (TYLENOL) suppository 650 mg  650 mg Rectal Q6H PRN Lance Coon, MD      . benzonatate (TESSALON) capsule 200 mg  200 mg Oral TID PRN Lance Coon, MD   200 mg at 02/15/15 2251  . diazepam (VALIUM) tablet 2 mg  2 mg Oral Q6H PRN Epifanio Lesches, MD   2 mg at 02/18/15 0243  . enoxaparin (LOVENOX) injection 40 mg  40 mg Subcutaneous Q24H Lance Coon, MD   40 mg at 02/18/15 0100  . feeding supplement (ENSURE ENLIVE) (ENSURE ENLIVE) liquid 237 mL  237 mL Oral TID Epifanio Lesches, MD   237 mL at 02/16/15 0846  . fentaNYL (DURAGESIC - dosed mcg/hr) patch 25 mcg  25 mcg Transdermal Q72H Epifanio Lesches, MD   25 mcg at 02/17/15 1109  . HYDROcodone-acetaminophen (NORCO/VICODIN) 5-325 MG per tablet 1 tablet  1 tablet Oral Q4H PRN Lance Coon, MD   1 tablet at 02/17/15 0912  . HYDROmorphone (DILAUDID) injection 2 mg  2 mg Intravenous Q2H  PRN Epifanio Lesches, MD   2 mg at 02/18/15 0657  . iohexol (OMNIPAQUE) 240 MG/ML injection 50 mL  50 mL Oral Once PRN Lequita Asal, MD      . ipratropium-albuterol (DUONEB) 0.5-2.5 (3) MG/3ML nebulizer solution 3 mL  3 mL Nebulization Q4H PRN Lance Coon, MD   3 mL at 02/14/15 0618  . nicotine (NICODERM CQ - dosed in mg/24 hours) patch 21 mg  21 mg Transdermal Daily Lance Coon, MD   21 mg at 02/17/15 0913  . ondansetron (ZOFRAN) tablet 4 mg  4 mg Oral Q6H PRN Lance Coon, MD       Or  . ondansetron [place]) injection 4 mg  4 mg Intravenous Q6H PRN Lance Coon, MD      . predniSONE (DELTASONE) tablet 50 mg  50  mg Oral Q breakfast Epifanio Lesches, MD        Vital Signs: BP 134/80 mmHg  Pulse 103  Temp(Src) 99 F (37.2 C) (Oral)  Resp 20  Ht '5\' 4"'$  (1.626 m)  Wt 63.05 kg (139 lb)  BMI 23.85 kg/m2  SpO2 91% Filed Weights   02/12/15 1908 02/13/15 0032 02/15/15 1502  Weight: 65.942 kg (145 lb 6 oz) 63.458 kg (139 lb 14.4 oz) 63.05 kg (139 lb)    Estimated body mass index is 23.85 kg/(m^2) as calculated from the following:   Height as of this encounter: '5\' 4"'$  (1.626 m).   Weight as of this encounter: 63.05 kg (139 lb).  PERFORMANCE STATUS (ECOG) : 2 - Symptomatic, <50% confined to bed  PHYSICAL EXAM: Weak but verbal Wearing O2 on her forehead--says she gets panicky at times and it helps (she requested the O2) Hrt rrr no m Lungs decreased bs bases Abd soft nl Bs Skin warm and dry   LABS: CBC:    Component Value Date/Time   WBC 8.0 02/16/2015 0454   WBC 9.0 04/10/2012 1601   HGB 12.2 02/16/2015 0454   HGB 16.0 04/10/2012 1601   HCT 36.4 02/16/2015 0454   HCT 46.4 04/10/2012 1601   PLT 176 02/16/2015 0454   PLT 222 04/10/2012 1601   MCV 81.5 02/16/2015 0454   MCV 85 04/10/2012 1601   NEUTROABS 6.7* 02/16/2015 0454   LYMPHSABS 0.8* 02/16/2015 0454   MONOABS 0.4 02/16/2015 0454   EOSABS 0.0 02/16/2015 0454   BASOSABS 0.0 02/16/2015 0454   Comprehensive Metabolic Panel:    Component Value Date/Time   NA 145 02/13/2015 0540   NA 138 04/10/2012 1601   K 3.6 02/13/2015 0540   K 3.9 04/10/2012 1601   CL 111 02/13/2015 0540   CL 106 04/10/2012 1601   CO2 28 02/13/2015 0540   CO2 27 04/10/2012 1601   BUN 8 02/13/2015 0540   BUN 12 04/10/2012 1601   CREATININE 0.77 02/16/2015 0454   CREATININE 0.89 04/10/2012 1601   GLUCOSE 121* 02/13/2015 0540   GLUCOSE 97 04/10/2012 1601   CALCIUM 9.6 02/13/2015 0540   CALCIUM 9.7 04/10/2012 1601   AST 37 02/13/2015 0540   ALT 29 02/13/2015 0540   ALKPHOS 125 02/13/2015 0540   BILITOT 0.5 02/13/2015 0540   PROT 6.0* 02/13/2015  0540   ALBUMIN 3.0* 02/13/2015 0540     More than 50% of the visit was spent in counseling/coordination of care: Yes  Time Spent:80  minutes

## 2015-02-19 ENCOUNTER — Inpatient Hospital Stay: Payer: Medicaid Other

## 2015-02-19 ENCOUNTER — Encounter
Admission: EM | Disposition: A | Discharge status: Home Health | Payer: Self-pay | Source: Home / Self Care | Attending: Internal Medicine

## 2015-02-19 DIAGNOSIS — C7931 Secondary malignant neoplasm of brain: Secondary | ICD-10-CM

## 2015-02-19 DIAGNOSIS — Z22322 Carrier or suspected carrier of Methicillin resistant Staphylococcus aureus: Secondary | ICD-10-CM

## 2015-02-19 DIAGNOSIS — C787 Secondary malignant neoplasm of liver and intrahepatic bile duct: Secondary | ICD-10-CM

## 2015-02-19 LAB — BASIC METABOLIC PANEL
Anion gap: 8 (ref 5–15)
BUN: 9 mg/dL (ref 6–20)
CHLORIDE: 104 mmol/L (ref 101–111)
CO2: 33 mmol/L — AB (ref 22–32)
CREATININE: 0.67 mg/dL (ref 0.44–1.00)
Calcium: 9.6 mg/dL (ref 8.9–10.3)
GFR calc Af Amer: >60 mL/min (ref >60)
GFR calc non Af Amer: >60 mL/min (ref >60)
GLUCOSE: 125 mg/dL — AB (ref 65–99)
POTASSIUM: 4.1 mmol/L (ref 3.5–5.1)
SODIUM: 145 mmol/L (ref 135–145)

## 2015-02-19 LAB — CBC WITH DIFFERENTIAL/PLATELET
BASOS ABS: 0 10*3/uL (ref 0–0.1)
BASOS PCT: 0 %
EOS PCT: 0 %
Eosinophils Absolute: 0 10*3/uL (ref 0–0.7)
HCT: 34.6 % — ABNORMAL LOW (ref 35.0–47.0)
HEMOGLOBIN: 11.7 g/dL — AB (ref 12.0–16.0)
LYMPHS PCT: 7 %
Lymphs Abs: 0.6 10*3/uL — ABNORMAL LOW (ref 1.0–3.6)
MCH: 27.5 pg (ref 26.0–34.0)
MCHC: 33.7 g/dL (ref 32.0–36.0)
MCV: 81.6 fL (ref 80.0–100.0)
MONO ABS: 0.7 10*3/uL (ref 0.2–0.9)
Monocytes Relative: 8 %
NEUTROS PCT: 85 %
NRBC: 1 /100{WBCs} — AB
Neutro Abs: 7.8 10*3/uL — ABNORMAL HIGH (ref 1.4–6.5)
Platelets: 157 10*3/uL (ref 150–440)
RBC: 4.24 MIL/uL (ref 3.80–5.20)
RDW: 16.5 % — ABNORMAL HIGH (ref 11.5–14.5)
WBC: 9.1 10*3/uL (ref 3.6–11.0)

## 2015-02-19 LAB — PROTIME-INR
INR: 1.17
Prothrombin Time: 15.1 seconds — ABNORMAL HIGH (ref 11.4–15.0)

## 2015-02-19 LAB — APTT: aPTT: 23 seconds — ABNORMAL LOW (ref 24–36)

## 2015-02-19 SURGERY — INSERTION, TUNNELED CENTRAL VENOUS DEVICE, WITH PORT
Anesthesia: Choice

## 2015-02-19 MED ORDER — FENTANYL 25 MCG/HR TD PT72: 25.0000 ug | MEDICATED_PATCH | TRANSDERMAL | Status: DC

## 2015-02-19 MED ORDER — DOCUSATE SODIUM 100 MG PO CAPS: 100.0000 mg | ORAL_CAPSULE | Freq: Two times a day (BID) | ORAL | Status: DC

## 2015-02-19 MED ORDER — NYSTATIN 100000 UNIT/ML MT SUSP: 5.0000 mL | Freq: Four times a day (QID) | OROMUCOSAL | Status: DC

## 2015-02-19 MED ORDER — FENTANYL CITRATE (PF) 100 MCG/2ML IJ SOLN
INTRAMUSCULAR | Status: AC
  Filled 2015-02-19: qty 2

## 2015-02-19 MED ORDER — SODIUM CHLORIDE 0.9 % IV SOLN
INTRAVENOUS | Status: AC | PRN
  Administered 2015-02-19: 10:00:00 10 mL/h via INTRAVENOUS

## 2015-02-19 MED ORDER — HYDROCODONE-ACETAMINOPHEN 5-325 MG PO TABS: 1.0000 | ORAL_TABLET | ORAL | Status: DC | PRN

## 2015-02-19 MED ORDER — FENTANYL CITRATE (PF) 100 MCG/2ML IJ SOLN
INTRAMUSCULAR | Status: AC | PRN
  Administered 2015-02-19: 10:00:00 50 ug via INTRAVENOUS

## 2015-02-19 MED ORDER — MIDAZOLAM HCL 2 MG/2ML IJ SOLN
INTRAMUSCULAR | Status: AC | PRN
  Administered 2015-02-19: 1 mg via INTRAVENOUS

## 2015-02-19 MED ORDER — NYSTATIN 100000 UNIT/ML MT SUSP
5.0000 mL | Freq: Four times a day (QID) | OROMUCOSAL | Status: DC
  Administered 2015-02-19 (×2): 500000 [IU] via ORAL
  Filled 2015-02-19 (×2): qty 5

## 2015-02-19 MED ORDER — MIDAZOLAM HCL 2 MG/2ML IJ SOLN
INTRAMUSCULAR | Status: AC
  Filled 2015-02-19: qty 4

## 2015-02-19 MED ORDER — NICOTINE 21 MG/24HR TD PT24: 21.0000 mg | MEDICATED_PATCH | Freq: Every day | TRANSDERMAL | Status: AC

## 2015-02-19 NOTE — Progress Notes (Signed)
Palliative Medicine Inpatient Consult Follow Up Note   Name: Brandy Odom Date: 02/19/2015 MRN: [id number]  DOB: 09-May-1950  Referring Physician: Bettey Costa, MD  Palliative Care consult requested for this 64 y.o. female for goals of medical therapy in patient with metastatic disease to brain and bone.    TODAY'S DISCUSSIONS AND DECISIONS: 1.  Path report revealed yesterday that pt has a low grade carcinoid of colon. This would not be the kind of cancer that would cause the metastatic disease she has.  She today underwent a biopsy by CT guidance of right iliac scerotic bone met.  This will reveal the type of cancer found there and then treatment options can be presented to her.   2.  Once the treatment options are presented, she can then decide if she wants to pursue treatment or not.   3,  She has symptoms she does not feel are managed optimally, and some adjustments to her medication regimen will be explored.   4.  Pt may be DCd home and follow up with Dr. Mike Gip as outpt. 5.  Pt to be followed by [facility] so that she can transition to [place] at the appropriate time.   IMPRESSION: 1.  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   ----pattern not c/w carcinoid ----right iliac core biopsy was obtained on Dec 2. ----treatment options will be presented after path from biopsy is known ----unclear if palliative radiation will start soon or now or not. ----port-a-cath placement delayed until more is known. 2.  Pain from met. dz ---not optimally controlled at all times 3.  Anxiety and h/o panic attacks ---pt feels she has to have the oxygen for security ---will explore regimen for anxiety and her need for O2 4.  MRSA urinary colonization 5.  Severe malnutrition due to cancer ----with 28 lb wt loss 6.  Ongoing tobacco smoking (50 pk yr hx) ----says has quit 7.   Visual changes ----presented with diplopia on  lateral gaze  8.   Constipation at admission due to partially obstructing low grade carcinoid tumor found on colonoscopy 02/15/15.  CEA 333.8.      CODE STATUS: Full code   PAST MEDICAL HISTORY: Past Medical History  Diagnosis Date  . Tobacco abuse   . Muscle spasm   . Cancer [facility])     PAST SURGICAL HISTORY:  Past Surgical History  Procedure Laterality Date  . Appendectomy    . Colonoscopy with propofol N/A 02/15/2015    Procedure: COLONOSCOPY WITH PROPOFOL;  Surgeon: Lucilla Lame, MD;  Location: [facility] ENDOSCOPY;  Service: Endoscopy;  Laterality: N/A;    Vital Signs: BP 117/80 mmHg  Pulse 95  Temp(Src) 99 F (37.2 C) (Oral)  Resp 12  Ht 5' 4"  (1.626 m)  Wt 63.05 kg (139 lb)  BMI 23.85 kg/m2  SpO2 93% Filed Weights   02/12/15 1908 02/13/15 0032 02/15/15 1502  Weight: 65.942 kg (145 lb 6 oz) 63.458 kg (139 lb 14.4 oz) 63.05 kg (139 lb)    Estimated body mass index is 23.85 kg/(m^2) as calculated from the following:   Height as of this encounter: 5' 4"  (1.626 m).   Weight as of this encounter: 63.05 kg (139 lb).  PHYSICAL EXAM: Sleeping ---not wakened NAD Skin wnl    LABS: CBC:    Component Value Date/Time   WBC 9.1 02/19/2015 0507   WBC 9.0 04/10/2012 1601   HGB 11.7* 02/19/2015 0507  HGB 16.0 04/10/2012 1601   HCT 34.6* 02/19/2015 0507   HCT 46.4 04/10/2012 1601   PLT 157 02/19/2015 0507   PLT 222 04/10/2012 1601   MCV 81.6 02/19/2015 0507   MCV 85 04/10/2012 1601   NEUTROABS 7.8* 02/19/2015 0507   LYMPHSABS 0.6* 02/19/2015 0507   MONOABS 0.7 02/19/2015 0507   EOSABS 0.0 02/19/2015 0507   BASOSABS 0.0 02/19/2015 0507   Comprehensive Metabolic Panel:    Component Value Date/Time   NA 145 02/19/2015 0507   NA 138 04/10/2012 1601   K 4.1 02/19/2015 0507   K 3.9 04/10/2012 1601   CL 104 02/19/2015 0507   CL 106 04/10/2012 1601   CO2 33* 02/19/2015 0507   CO2 27 04/10/2012 1601   BUN 9 02/19/2015 0507   BUN 12 04/10/2012 1601   CREATININE  0.67 02/19/2015 0507   CREATININE 0.89 04/10/2012 1601   GLUCOSE 125* 02/19/2015 0507   GLUCOSE 97 04/10/2012 1601   CALCIUM 9.6 02/19/2015 0507   CALCIUM 9.7 04/10/2012 1601   AST 37 02/13/2015 0540   ALT 29 02/13/2015 0540   ALKPHOS 125 02/13/2015 0540   BILITOT 0.5 02/13/2015 0540   PROT 6.0* 02/13/2015 0540   ALBUMIN 3.0* 02/13/2015 0540    Time Spent:  15 min

## 2015-02-19 NOTE — Care Management (Signed)
Spoke with Ms. Pancake at the bedside. Last seen a primary care physician when she was discharged from [place] 5 years ago. Doesn't remember name of doctor. No falls in the home, but wall walks and crawls to bathroom. No medical equipment in the home Discussed home services thru [facility]. Flo Shanks, RN representative for [facility] updated.  Follow-up appointment with Dr. Mike Gip arranged for next week. Telephone call to Dr. Mike Gip to discuss if she would be willing to sign home  Health follow-up orders. States that she will sign orders. Discharge to home today per Dr. Benjie Karvonen. Husband will transport. Shelbie Ammons RN MSN CCM Care Management [phone]

## 2015-02-19 NOTE — Progress Notes (Signed)
Called Dr Lisbeth Renshaw Hydrocodone.Acetaminophe 5-325 on discharge summary and AVS twice, she said she will correct is only suppose to be on once she said to cross duplicate on AVS

## 2015-02-19 NOTE — Plan of Care (Signed)
Problem: Pain Managment: Goal: General experience of comfort will improve Outcome: Progressing Pt is still reporting chest pain and is anxious. Pt wants someone in the room as often as possible, pt report she is scared. Spent extended periods of time with pt and allow pt to verbalize feelings and offer support. Pt was able to sleep uninterrupted for a few hours. Pt takes off oxygen at times during the shift. No other signs of distress noted. Will continue to monitor.

## 2015-02-19 NOTE — Progress Notes (Signed)
Patient discharged home per MD order. VSS. Prescriptions given to patient. All discharge instructions given and all questions answered. Escorted by husband and aux.

## 2015-02-19 NOTE — Procedures (Signed)
Successful RT ILIAC SCLEROTIC BONE MET CORE BX NO COMP STABLE PATH PENDING FULL REPORT IN PACS

## 2015-02-19 NOTE — Discharge Summary (Addendum)
[place] [facility] [place] NAME: Brandy Odom    MR#:  [id number]  DATE OF BIRTH:  18-Mar-1951  DATE OF ADMISSION:  02/12/2015 ADMITTING PHYSICIAN: Lance Coon, MD  DATE OF DISCHARGE: 02/19/2015 PRIMARY CARE PHYSICIAN: No PCP Per Patient    ADMISSION DIAGNOSIS:  Lesion of lung [J98.4] Acute bronchitis, unspecified organism [J20.9]  DISCHARGE DIAGNOSIS:  Principal Problem:   Metastasis ([place]) Active Problems:   Dyspnea   Cough   Tobacco abuse   Hypoxia   Bone lesion   Lesion of lung   Orbital lesion   Protein-calorie malnutrition, severe   Abnormal CT of the abdomen   Abnormal radionuclide bone scan   Adenopathy   Colon cancer metastasized to multiple sites [facility])   Carcinoid tumor of cecum   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 Tobacco abuse   . Muscle spasm   . Cancer [facility])     HOSPITAL COURSE:  64 y/o female admitted for shortness of breath and found to have metastatic colon cancer.   1. Acute respiratory failure with hypoxia: This is multifactorial secondary to probable COPD exacerbation (no formal dg made but long hx of smoking) along with metastatic colon cancer to the adrenals, liver, lungs and spine. Patient's respiratory status has  improved. She is off of oxygen and weaned from steroids.  2. Low grade carcinoid tumor of colon: It was initially thought that patient may have metastatic colon cancer as she has lesions documented  adrenals, liver, lungs, brain spine and probable ovaries. Patient has been seen and evaluated by oncology as well as radiation oncology. She underwent colonoscopy with pathology showing low-grade carcinoid tumor of colon. Oncologist did not feel that this was the likely etiology of her lesions in the above-mentioned areas. She underwent iliac biopsy on 02/19/2015. Pathology will be followed up as an outpatient. She will continue on  pain medications and have follow-up on Tuesday with the oncologist for further evaluation and treatment plan. 3. MRSA urinary tract infection: This is thought to be due to colonization.   4. Severe malnutrition: This is in the context of critical illness continue and sugar   DISCHARGE CONDITIONS AND DIET:   Stable on a regular diet  CONSULTS OBTAINED:  Treatment Team:  Lequita Asal, MD Clayburn Pert, MD Noreene Filbert, MD  DRUG ALLERGIES:   Allergies  Allergen Reactions  . Bee Venom Anaphylaxis  . Naproxen Nausea And Vomiting  . Sulfa Antibiotics Other (See Comments)    Reaction:  Unknown   . Antihistamines, Chlorpheniramine-Type Palpitations    DISCHARGE MEDICATIONS:   Current Discharge Medication List    START taking these medications   Details  docusate sodium (COLACE) 100 MG capsule Take 1 capsule (100 mg total) by mouth 2 (two) times daily. Qty: 10 capsule, Refills: 0    fentaNYL (DURAGESIC - DOSED MCG/HR) 25 MCG/HR patch Place 1 patch (25 mcg total) onto the skin every 3 (three) days. Qty: 5 patch, Refills: 0    nicotine (NICODERM CQ - DOSED IN MG/24 HOURS) 21 mg/24hr patch Place 1 patch (21 mg total) onto the skin daily. Qty: 28 patch, Refills: 0    nystatin (MYCOSTATIN) 100000 UNIT/ML suspension Take 5 mLs (500,000 Units total) by mouth 4 (four) times daily. Qty: 60 mL, Refills: 0      CONTINUE these medications which have CHANGED   Details  !! HYDROcodone-acetaminophen (NORCO) 5-325 MG  tablet Take 1 tablet by mouth every 4 (four) hours as needed for moderate pain. Qty: 30 tablet, Refills: 0     !! - Potential duplicate medications found. Please discuss with provider.    CONTINUE these medications which have NOT CHANGED   Details  diazepam (VALIUM) 5 MG tablet Take 1 tablet (5 mg total) by mouth every 8 (eight) hours as needed for muscle spasms. Qty: 12 tablet, Refills: 0         predniSONE (STERAPRED UNI-PAK 21 TAB) 10 MG (21) TBPK tablet  6 tablets on day 1, 5 tablets on day 2, 4 tablets on day 3, etc... Qty: 21 tablet, Refills: 0     !! - Potential duplicate medications found. Please discuss with provider.    STOP taking these medications     azithromycin (ZITHROMAX) 250 MG tablet      traMADol (ULTRAM) 50 MG tablet               Today   CHIEF COMPLAINT:  Patient continues have a cough but no other acute events overnight. She would like to go home. She had a bowel movement this morning.   VITAL SIGNS:  Blood pressure 117/80, pulse 95, temperature 99 F (37.2 C), temperature source Oral, resp. rate 12, height '5\' 4"'$  (1.626 m), weight 63.05 kg (139 lb), SpO2 93 %.   REVIEW OF SYSTEMS:  Review of Systems  Constitutional: Negative for fever, chills and malaise/fatigue.  HENT: Negative for sore throat.   Eyes: Negative for blurred vision.  Respiratory: Negative for cough, hemoptysis, shortness of breath and wheezing.   Cardiovascular: Negative for chest pain, palpitations and leg swelling.  Gastrointestinal: Negative for nausea, vomiting, abdominal pain, diarrhea and blood in stool.  Genitourinary: Negative for dysuria.  Musculoskeletal: Positive for back pain and joint pain.  Neurological: Negative for dizziness, tremors and headaches.  Endo/Heme/Allergies: Does not bruise/bleed easily.     PHYSICAL EXAMINATION:  GENERAL:  64 y.o.-year-old patient lying in the bed with no acute distress.  NECK:  Supple, no jugular venous distention. No thyroid enlargement, no tenderness.  LUNGS: Normal breath sounds bilaterally, no wheezing, rales,rhonchi  No use of accessory muscles of respiration.  CARDIOVASCULAR: S1, S2 normal. No murmurs, rubs, or gallops.  ABDOMEN: Soft, non-tender, non-distended. Bowel sounds present. No organomegaly or mass.  EXTREMITIES: No pedal edema, cyanosis, or clubbing.  PSYCHIATRIC: The patient is alert and oriented x 3.  SKIN: No obvious rash, lesion, or ulcer.   DATA REVIEW:    CBC  Recent Labs Lab 02/19/15 0507  WBC 9.1  HGB 11.7*  HCT 34.6*  PLT 157    Chemistries   Recent Labs Lab 02/13/15 0540  02/19/15 0507  NA 145  --  145  K 3.6  --  4.1  CL 111  --  104  CO2 28  --  33*  GLUCOSE 121*  --  125*  BUN 8  --  9  CREATININE 0.64  < > 0.67  CALCIUM 9.6  --  9.6  MG 2.0  --   --   AST 37  --   --   ALT 29  --   --   ALKPHOS 125  --   --   BILITOT 0.5  --   --   < > = values in this interval not displayed.  Cardiac Enzymes  Recent Labs Lab 02/12/15 [place] <0.03    Microbiology Results  '@[id number]'$ @  RADIOLOGY:  Ct Biopsy  02/19/2015  CLINICAL DATA:  Multiple sclerotic bone metastases, no known primary EXAM: CT-GUIDED BIOPSY RIGHT ILIAC CREST SCLEROTIC BONE LESION MEDICATIONS AND MEDICAL HISTORY: Versed 1.0 mg, Fentanyl 50 mcg. Additional Medications: None. ANESTHESIA/SEDATION: Moderate sedation time: 15 minutes PROCEDURE: The procedure, risks, benefits, and alternatives were explained to the patient. Questions regarding the procedure were encouraged and answered. The patient understands and consents to the procedure. The right posterior iliac area was prepped with chlorhexidine in a sterile fashion, and a sterile drape was applied covering the operative field. A sterile gown and sterile gloves were used for the procedure. Under CT guidance, a(n) 11 gauge guide needle was advanced into the right iliac crest sclerotic bone lesion. Needle position confirmed with CT. 11 gauge drill core biopsy obtained. Sample placed on a saline soaked Telfa. Postprocedure imaging demonstrates core biopsy sample defect through the sclerotic lesion. No complication. Patient tolerated the procedure well without complication. Vital sign monitoring by nursing staff during the procedure will continue as patient is in the special procedures unit for post procedure observation. FINDINGS: The images document guide needle placement within the right iliac crest  sclerotic bone lesion. Post biopsy images demonstrate no hemorrhage or hematoma. COMPLICATIONS: None immediate IMPRESSION: Successful CT-guided core biopsy of the right iliac crest sclerotic bone lesion. Electronically Signed   By: Jerilynn Mages.  Shick M.D.   On: 02/19/2015 10:47      Management plans discussed with the patient and she is in agreement. Stable for discharge home  Patient should follow up with Dr Mike Gip next week  CODE STATUS:     Code Status Orders        Start     Ordered   02/13/15 0050  Full code   Continuous     02/13/15 0049      TOTAL TIME TAKING CARE OF THIS PATIENT: 35 minutes.    Note: This dictation was prepared with Dragon dictation along with smaller phrase technology. Any transcriptional errors that result from this process are unintentional.  Jaqlyn Gruenhagen M.D on 02/19/2015 at 11:57 AM  Between 7am to 6pm - Pager - [phone] After 6pm go to [email or web address] - password [email or web address] [facility]  [facility]  Office  [phone]  CC: Primary care physician; No PCP Per Patient

## 2015-02-19 NOTE — Progress Notes (Signed)
New referral for [facility] received from [facility]. Patient is a 64 year old woman with metastatic stage IV colon cancer. She lives at home with her husband. She has had a bone biopsy today and plan is for her to follow up at the [facility] on 12/6 at 11:30am  with Dr. Mike Gip for the results and treatment options. Per writers conversation with CMRN [name] patient has no PCP, Dr. Mike Gip has agreed to write Home health orders. Pain is controlled with 25 mcg duragesic and IV dilaudid 2 mg. Planned d/c today. No DME requested.  Information faxed to referral intake. Information and contact phone number given to Brandy Odom. Patient  address and contact numbers confirmed.. Plan for discharge home today via car.  Thank you for the opportunity to be involved in the care of this patient. Flo Shanks RN, BSN, [place] [phone]

## 2015-02-22 ENCOUNTER — Other Ambulatory Visit: Payer: Self-pay | Admitting: *Deleted

## 2015-02-22 MED ORDER — HYDROCODONE-ACETAMINOPHEN 5-325 MG PO TABS: 1.0000 | ORAL_TABLET | ORAL | Status: DC | PRN

## 2015-02-22 MED ORDER — DIAZEPAM 5 MG PO TABS: 5.0000 mg | ORAL_TABLET | Freq: Three times a day (TID) | ORAL | Status: DC | PRN

## 2015-02-22 NOTE — Telephone Encounter (Signed)
Called to states that she was discharged on 12/2 and has an appt with Dr Mike Gip 12/6. She is having pain and "the only thing helping is the diazepam and hydrocodone" . She was given #12 diazepam and #30 Hydrocodone on 12/2. She is asking for "a few more to get through when I come in to see her tomorrow"

## 2015-02-22 NOTE — Telephone Encounter (Signed)
OK to give # 20 tabs each rx per Dr Mike Gip. Dr C asks that patient keep a pain med diary. I have attempted to call pt back and had to leave a message on her VM to return my call

## 2015-02-22 NOTE — Telephone Encounter (Signed)
Patient returned call and was told rx will need to be picked up and taken to pharmacy, also advised that MD wants her to keep record of every time she takes pain meds. She stated all right and thanked me

## 2015-02-23 ENCOUNTER — Inpatient Hospital Stay: Payer: Self-pay | Admitting: Hematology and Oncology

## 2015-02-23 LAB — SURGICAL PATHOLOGY

## 2015-02-25 ENCOUNTER — Inpatient Hospital Stay: Payer: Medicaid Other | Attending: Hematology and Oncology | Admitting: Hematology and Oncology

## 2015-02-25 ENCOUNTER — Inpatient Hospital Stay: Payer: Medicaid Other

## 2015-02-25 ENCOUNTER — Encounter: Payer: Self-pay | Admitting: Internal Medicine

## 2015-02-25 ENCOUNTER — Inpatient Hospital Stay
Admission: AD | Admit: 2015-02-25 | Discharge: 2015-03-06 | DRG: 947 | Disposition: A | Discharge status: Hospice | Payer: Medicaid Other | Source: Ambulatory Visit | Attending: Internal Medicine | Admitting: Internal Medicine

## 2015-02-25 VITALS — BP 106/77 | HR 92 | Temp 97.2°F | Resp 18 | Ht 64.0 in

## 2015-02-25 DIAGNOSIS — Z79899 Other long term (current) drug therapy: Secondary | ICD-10-CM | POA: Diagnosis not present

## 2015-02-25 DIAGNOSIS — C7800 Secondary malignant neoplasm of unspecified lung: Secondary | ICD-10-CM | POA: Insufficient documentation

## 2015-02-25 DIAGNOSIS — R918 Other nonspecific abnormal finding of lung field: Secondary | ICD-10-CM

## 2015-02-25 DIAGNOSIS — Z452 Encounter for adjustment and management of vascular access device: Secondary | ICD-10-CM

## 2015-02-25 DIAGNOSIS — C7951 Secondary malignant neoplasm of bone: Secondary | ICD-10-CM | POA: Diagnosis present

## 2015-02-25 DIAGNOSIS — C799 Secondary malignant neoplasm of unspecified site: Secondary | ICD-10-CM | POA: Diagnosis present

## 2015-02-25 DIAGNOSIS — E43 Unspecified severe protein-calorie malnutrition: Secondary | ICD-10-CM | POA: Diagnosis present

## 2015-02-25 DIAGNOSIS — C7A021 Malignant carcinoid tumor of the cecum: Secondary | ICD-10-CM

## 2015-02-25 DIAGNOSIS — Z66 Do not resuscitate: Secondary | ICD-10-CM | POA: Diagnosis present

## 2015-02-25 DIAGNOSIS — G893 Neoplasm related pain (acute) (chronic): Principal | ICD-10-CM | POA: Diagnosis present

## 2015-02-25 DIAGNOSIS — C7971 Secondary malignant neoplasm of right adrenal gland: Secondary | ICD-10-CM | POA: Diagnosis present

## 2015-02-25 DIAGNOSIS — R630 Anorexia: Secondary | ICD-10-CM

## 2015-02-25 DIAGNOSIS — C787 Secondary malignant neoplasm of liver and intrahepatic bile duct: Secondary | ICD-10-CM | POA: Diagnosis present

## 2015-02-25 DIAGNOSIS — Z79891 Long term (current) use of opiate analgesic: Secondary | ICD-10-CM

## 2015-02-25 DIAGNOSIS — C3490 Malignant neoplasm of unspecified part of unspecified bronchus or lung: Secondary | ICD-10-CM

## 2015-02-25 DIAGNOSIS — B370 Candidal stomatitis: Secondary | ICD-10-CM | POA: Diagnosis present

## 2015-02-25 DIAGNOSIS — F17210 Nicotine dependence, cigarettes, uncomplicated: Secondary | ICD-10-CM | POA: Insufficient documentation

## 2015-02-25 DIAGNOSIS — C7960 Secondary malignant neoplasm of unspecified ovary: Secondary | ICD-10-CM | POA: Insufficient documentation

## 2015-02-25 DIAGNOSIS — E871 Hypo-osmolality and hyponatremia: Secondary | ICD-10-CM | POA: Diagnosis present

## 2015-02-25 DIAGNOSIS — R1310 Dysphagia, unspecified: Secondary | ICD-10-CM | POA: Diagnosis present

## 2015-02-25 DIAGNOSIS — F4323 Adjustment disorder with mixed anxiety and depressed mood: Secondary | ICD-10-CM | POA: Diagnosis present

## 2015-02-25 DIAGNOSIS — M898X0 Other specified disorders of bone, multiple sites: Secondary | ICD-10-CM | POA: Diagnosis present

## 2015-02-25 DIAGNOSIS — C7A029 Malignant carcinoid tumor of the large intestine, unspecified portion: Secondary | ICD-10-CM | POA: Insufficient documentation

## 2015-02-25 DIAGNOSIS — F418 Other specified anxiety disorders: Secondary | ICD-10-CM | POA: Diagnosis present

## 2015-02-25 DIAGNOSIS — Z6823 Body mass index (BMI) 23.0-23.9, adult: Secondary | ICD-10-CM

## 2015-02-25 DIAGNOSIS — C7931 Secondary malignant neoplasm of brain: Secondary | ICD-10-CM | POA: Diagnosis present

## 2015-02-25 DIAGNOSIS — R52 Pain, unspecified: Secondary | ICD-10-CM | POA: Diagnosis present

## 2015-02-25 DIAGNOSIS — C180 Malignant neoplasm of cecum: Secondary | ICD-10-CM | POA: Diagnosis present

## 2015-02-25 DIAGNOSIS — E860 Dehydration: Secondary | ICD-10-CM | POA: Diagnosis present

## 2015-02-25 DIAGNOSIS — C7972 Secondary malignant neoplasm of left adrenal gland: Secondary | ICD-10-CM | POA: Diagnosis present

## 2015-02-25 DIAGNOSIS — Z598 Other problems related to housing and economic circumstances: Secondary | ICD-10-CM

## 2015-02-25 DIAGNOSIS — Z515 Encounter for palliative care: Secondary | ICD-10-CM | POA: Diagnosis present

## 2015-02-25 HISTORY — DX: Malignant neoplasm of unspecified part of unspecified bronchus or lung: C34.90

## 2015-02-25 LAB — CBC WITH DIFFERENTIAL/PLATELET
Basophils Absolute: 0 10*3/uL (ref 0–0.1)
Basophils Relative: 1 %
Eosinophils Absolute: 0.1 10*3/uL (ref 0–0.7)
Eosinophils Relative: 1 %
HCT: 37.8 % (ref 35.0–47.0)
Hemoglobin: 12.5 g/dL (ref 12.0–16.0)
Lymphocytes Relative: 13 %
Lymphs Abs: 0.9 10*3/uL — ABNORMAL LOW (ref 1.0–3.6)
MCH: 27 pg (ref 26.0–34.0)
MCHC: 33.2 g/dL (ref 32.0–36.0)
MCV: 81.4 fL (ref 80.0–100.0)
Monocytes Absolute: 0.4 10*3/uL (ref 0.2–0.9)
Monocytes Relative: 5 %
Neutro Abs: 5.6 10*3/uL (ref 1.4–6.5)
Neutrophils Relative %: 80 %
Platelets: 162 10*3/uL (ref 150–440)
RBC: 4.64 MIL/uL (ref 3.80–5.20)
RDW: 17.3 % — ABNORMAL HIGH (ref 11.5–14.5)
WBC: 7 10*3/uL (ref 3.6–11.0)

## 2015-02-25 LAB — PROTIME-INR
INR: 1.26
Prothrombin Time: 15.9 seconds — ABNORMAL HIGH (ref 11.4–15.0)

## 2015-02-25 LAB — COMPREHENSIVE METABOLIC PANEL
ALT: 41 U/L (ref 14–54)
AST: 33 U/L (ref 15–41)
Albumin: 3.1 g/dL — ABNORMAL LOW (ref 3.5–5.0)
Alkaline Phosphatase: 173 U/L — ABNORMAL HIGH (ref 38–126)
Anion gap: 8 (ref 5–15)
BUN: 7 mg/dL (ref 6–20)
CO2: 32 mmol/L (ref 22–32)
Calcium: 9.7 mg/dL (ref 8.9–10.3)
Chloride: 103 mmol/L (ref 101–111)
Creatinine, Ser: 0.8 mg/dL (ref 0.44–1.00)
GFR calc Af Amer: >60 mL/min (ref >60)
GFR calc non Af Amer: >60 mL/min (ref >60)
Glucose, Bld: 103 mg/dL — ABNORMAL HIGH (ref 65–99)
Potassium: 3.4 mmol/L — ABNORMAL LOW (ref 3.5–5.1)
Sodium: 143 mmol/L (ref 135–145)
Total Bilirubin: 0.8 mg/dL (ref 0.3–1.2)
Total Protein: 6.8 g/dL (ref 6.5–8.1)

## 2015-02-25 MED ORDER — DIAZEPAM 5 MG PO TABS
5.0000 mg | ORAL_TABLET | Freq: Three times a day (TID) | ORAL | Status: DC | PRN
  Administered 2015-02-25 – 2015-02-28 (×6): 5 mg via ORAL
  Filled 2015-02-25 (×7): qty 1

## 2015-02-25 MED ORDER — HYDROCODONE-ACETAMINOPHEN 5-325 MG PO TABS
1.0000 | ORAL_TABLET | ORAL | Status: DC | PRN
  Administered 2015-02-26 (×3): 1 via ORAL
  Filled 2015-02-25 (×3): qty 1

## 2015-02-25 MED ORDER — NICOTINE 21 MG/24HR TD PT24
21.0000 mg | MEDICATED_PATCH | Freq: Every day | TRANSDERMAL | Status: DC
  Administered 2015-02-25 – 2015-03-06 (×10): 21 mg via TRANSDERMAL
  Filled 2015-02-25 (×10): qty 1

## 2015-02-25 MED ORDER — ACETAMINOPHEN 325 MG PO TABS: 650.0000 mg | ORAL_TABLET | Freq: Four times a day (QID) | ORAL | Status: DC | PRN

## 2015-02-25 MED ORDER — ACETAMINOPHEN 650 MG RE SUPP: 650.0000 mg | Freq: Four times a day (QID) | RECTAL | Status: DC | PRN

## 2015-02-25 MED ORDER — FENTANYL 25 MCG/HR TD PT72
25.0000 ug | MEDICATED_PATCH | TRANSDERMAL | Status: DC
  Administered 2015-02-25: 19:00:00 25 ug via TRANSDERMAL
  Filled 2015-02-25: qty 1

## 2015-02-25 MED ORDER — SODIUM CHLORIDE 0.9 % IV SOLN
INTRAVENOUS | Status: AC
  Administered 2015-02-25 – 2015-02-26 (×2): via INTRAVENOUS

## 2015-02-25 MED ORDER — HEPARIN SODIUM (PORCINE) 5000 UNIT/ML IJ SOLN
5000.0000 [IU] | Freq: Three times a day (TID) | INTRAMUSCULAR | Status: DC
  Administered 2015-02-25 – 2015-03-06 (×19): 5000 [IU] via SUBCUTANEOUS
  Filled 2015-02-25 (×22): qty 1

## 2015-02-25 MED ORDER — ENSURE ENLIVE PO LIQD
237.0000 mL | Freq: Two times a day (BID) | ORAL | Status: DC
  Administered 2015-02-26: 237 mL via ORAL

## 2015-02-25 MED ORDER — DOCUSATE SODIUM 100 MG PO CAPS
100.0000 mg | ORAL_CAPSULE | Freq: Two times a day (BID) | ORAL | Status: DC
Start: 2015-02-25 — End: 2015-02-27
  Administered 2015-02-25 – 2015-02-27 (×4): 100 mg via ORAL
  Filled 2015-02-25 (×4): qty 1

## 2015-02-25 MED ORDER — NYSTATIN 100000 UNIT/ML MT SUSP
5.0000 mL | Freq: Four times a day (QID) | OROMUCOSAL | Status: DC
  Administered 2015-02-25 – 2015-03-06 (×28): 500000 [IU] via ORAL
  Filled 2015-02-25 (×27): qty 5

## 2015-02-25 MED ORDER — HYDROMORPHONE HCL 1 MG/ML IJ SOLN
1.0000 mg | INTRAMUSCULAR | Status: DC | PRN
  Administered 2015-02-25 – 2015-02-27 (×5): 1 mg via INTRAVENOUS
  Filled 2015-02-25 (×5): qty 1

## 2015-02-25 NOTE — Plan of Care (Signed)
Problem: Fluid Volume: Goal: Ability to maintain a balanced intake and output will improve Outcome: Progressing Pt direct admit from the cancer center. IVF infusing.  Dilaudid given 1x for pain. Patient resting comfortably and talking to chaplain.

## 2015-02-25 NOTE — Progress Notes (Signed)
   02/25/15 2044  Clinical Encounter Type  Visited With Patient  Visit Type Initial  Referral From Patient  Consult/Referral To Chaplain  Spiritual Encounters  Spiritual Needs Emotional  Stress Factors  Patient Stress Factors Health changes;Family relationships  Chaplain visited with patient and provided a compassionate presence.    Painter [phone]

## 2015-02-25 NOTE — Progress Notes (Signed)
Patient is here for follow-up post hospital discharge and bx results. Patient states that she is having terrible pain in her left side. She rates her pain a 10+/10. She states that she has taken all of her pain medicine, but it did not help that much. She family states that she has not eaten since coming home from the hospital and has only been drinking Ensure.

## 2015-02-25 NOTE — Progress Notes (Signed)
[facility] day:  02/25/2015  Chief Complaint: Brandy Odom is a 64 y.o. female with carcinoid tumor of the cecum and metastatic small cell lung cancer who is seen for assessment after initial hospitalization.  HPI:  The patient was admitted to [facility] from 02/12/2015 until 02/19/2015.  She presented with feeling ill x 3 months, shortness of breath, chest pain, diplopia on lateral gaze, poor appetite, and weight loss of 28 pounds in 2 months.  Chest CT on 02/12/2015 revealed mixed lucent and sclerotic and expansile lesions in the spine, ribs, and sternum.  There was mediastinal lymphadenopathy with bilateral pulmonary nodules (some solid nodules and some patchy ground-glass nodules). There was focal spiculation in the right middle lung.  There was bilateral adrenal gland nodules and skin nodules.  Head CT without contrast on 02/12/2015 revealed a small ovoid soft tissue lesion within the medial aspect of the left orbit superior to the medial rectus muscle. Orbital MRI on  02/15/2015 revealed a 7.0 x 10.5 x 9.5 mm lesion within the belly of the left superior oblique muscle.  There were multiple focal areas enhancement involving the cerebellum bilaterally, the left basal ganglia and thalamus, and the anteromedial right frontal lobe. There was thickening and enhancement of the pituitary infundibulum. There was diffuse osseous lesions.   Bone scan on 02/15/2015 revealed abnormal uptake throughout the calvarium, ribs, vertebral bodies, pelvis, bilateral femurs and shoulders.  Plain films of the shoulders and femurs revealed no impending fracture.  Abdominal and pelvic CT scan on 02/14/2015 revealed a 3.7 x 3.2 x 3.7 cm mass in the cecum with associated ileocolic lymphadenopathy (largest 3.8 cm), probable metastatic disease to the adrenal glands bilaterally, suspicious hypovascular lesion in segment 5 of the liver.  There were multiple low to intermediate  attenuation cystic appearing lesions in the ovaries bilaterally.  Transvaginal ultrasound on 02/16/2015 revealed an endometrial thickness of 12 mm and enlarged and lobular ovaries.  CA125 was 38.3 (0-38.1) on 02/15/2015.  Colonoscopy on 02/15/2015 revealed a frond-like/villous partially obstructing non-circumferential mass in the cecum. Biopsies revealed a low grade carcinoid tumor. CEA was 333.8 on 02/14/2015.  Work-up for myeloma was negative (SPEP, free light chains, and 24 hour UPEP/free light chains).  Prior to discharge on 02/19/2015, she underwent CT guided right iliac crest biopsy.  Pathology subsequently returned positive for metastatic small cell carcinoma of the lung.  CK7, TTF1, CD56 were positive.  CK20 and CDX-2 were negative.  While hospitalized, pain was controlled with Dilaudid.  She was discharged home on a Fentanyl patch and hydrocodone 5/acetaminophen 325 every 4 hours prn pain.  She states that she ran out of her pain pills this morning.  Pain is mostly on the left side and is poorly controlled.    Symptomatically, she has eaten very little since discharge from the hospital.  She initially was drinking Ensure, then recently water and soda.  Pain is limiting her activity.  Past Medical History  Diagnosis Date  . Tobacco abuse   . Muscle spasm   . Cancer [facility])     Past Surgical History  Procedure Laterality Date  . Appendectomy    . Colonoscopy with propofol N/A 02/15/2015    Procedure: COLONOSCOPY WITH PROPOFOL;  Surgeon: Lucilla Lame, MD;  Location: [facility] ENDOSCOPY;  Service: Endoscopy;  Laterality: N/A;    Family History  Problem Relation Age of Onset  . Colon cancer      Social History:  reports that she has  been smoking Cigarettes.  She does not have any smokeless tobacco history on file. She reports that she does not drink alcohol or use illicit drugs.  The patient is accompanied by her husband and sister-in-law today.  Allergies:  Allergies  Allergen  Reactions  . Bee Venom Anaphylaxis  . Naproxen Nausea And Vomiting  . Sulfa Antibiotics Other (See Comments)    Reaction:  Unknown   . Antihistamines, Chlorpheniramine-Type Palpitations    Current Medications: Current Outpatient Prescriptions  Medication Sig Dispense Refill  . diazepam (VALIUM) 5 MG tablet Take 1 tablet (5 mg total) by mouth every 8 (eight) hours as needed for muscle spasms. 20 tablet 0  . docusate sodium (COLACE) 100 MG capsule Take 1 capsule (100 mg total) by mouth 2 (two) times daily. 10 capsule 0  . fentaNYL (DURAGESIC - DOSED MCG/HR) 25 MCG/HR patch Place 1 patch (25 mcg total) onto the skin every 3 (three) days. 5 patch 0  . HYDROcodone-acetaminophen (NORCO) 5-325 MG tablet Take 1 tablet by mouth every 4 (four) hours as needed for moderate pain. 20 tablet 0  . nicotine (NICODERM CQ - DOSED IN MG/24 HOURS) 21 mg/24hr patch Place 1 patch (21 mg total) onto the skin daily. 28 patch 0  . nystatin (MYCOSTATIN) 100000 UNIT/ML suspension Take 5 mLs (500,000 Units total) by mouth 4 (four) times daily. 60 mL 0   No current facility-administered medications for this visit.    Review of Systems:  GENERAL:  Feels terrible.  No fevers, sweats or weight loss. PERFORMANCE STATUS (ECOG):  2-3 HEENT: Diplopia on right lateral gaze. No runny nose, sore throat, mouth sores or tenderness. Lungs: Shortness of breath. Cough with popping sensation in ribs, at times. No hemoptysis. Cardiac: Chest/rib pain. No palpitations, orthopnea, or PND. GI: Poor appetite. Constipation, improved. No vomiting, diarrhea,melena or hematochezia. GU: No urgency, frequency, dysuria, or hematuria. Musculoskeletal: Diffuse bone pain (mostly on left side). No muscle tenderness. Extremities: No swelling. Skin: No rashes or skin changes. Neuro: General weakness.  No headache, numbness or weakness, balance or coordination issues. Endocrine: No diabetes, thyroid issues, hot flashes or night  sweats. Psych: Anxiety. Pain: Diffuse bone pain, "worse every day". Review of systems: All other systems reviewed and found to be negative  Physical Exam: Blood pressure 106/77, pulse 92, temperature 97.2 F (36.2 C), temperature source Tympanic, resp. rate 18, height '5\' 4"'$  (1.626 m). GENERAL: Chronically fatigued appearing woman lying in a bed in the clinic in pain. MENTAL STATUS: Alert and oriented to person, place and time. HEAD: Slightly disheveled shoulder length hair with dark roots. Normocephalic, atraumatic, face symmetric, no Cushingoid features. EYES: Brown eyes. Pupils equal round and reactive to light and accomodation.  No conjunctivitis or scleral icterus. ENT: Thrush. Tongue coated. Mucous membranes moist.  RESPIRATORY: Poor respiratory excursion. Crackles at the bases.  Intermittent soft wheezezs. CARDIOVASCULAR: Regular rate and rhythm without murmur, rub or gallop. ABDOMEN: Soft, non-tender, with active bowel sounds, and no appreciable hepatosplenomegaly. No masses. SKIN: No rashes or ulcers. EXTREMITIES: No edema, no skin discoloration or tenderness. No palpable cords. LYMPH NODES: Right posterior neck 2 cm mobile cyst (old). No palpable cervical, supraclavicular, axillary or inguinal adenopathy  NEUROLOGICAL: Stable. PSYCH: Appropriate.  No visits with results within 3 Day(s) from this visit. Latest known visit with results is:  Admission on 02/12/2015, Discharged on 02/19/2015  No results displayed because visit has over 200 results.      Assessment:  Brandy Odom is a 64 y.o. female  with metastatic small cell lung cancer. She has a greater than 50 pack year smoking history. She presented with a 28 pound weight loss over 2 months, nausea, constipation, diplopia on lateral gaze, and diffuse bone pain.   Chest CT on 02/12/2015 revealed mixed lucent and sclerotic and expansile lesions in the spine, ribs, and sternum.  There was mediastinal  lymphadenopathy with bilateral pulmonary nodules (some solid nodules and some patchy ground-glass nodules). There was focal spiculation in the right middle lung.  There was bilateral adrenal gland nodules and skin nodules.I  Abdominal/pelvic CT scan on 11/27/2016revealed a 3.7 x 3.2 x 3.7 cm mass in the cecum with associated ileocolic lymphadenopathy (largest 3.8 cm), metastatic disease to adrenals bilaterally, and a suspicious lesion in segment 5 of the liver. There was widespread osseous metastasis. There were multiple low to intermediate attenuation cystic appearing lesions in the ovaries.  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 CA125 was 38.3 (0-38.1) on 02/15/2015.  Head CT revealed a small ovoid soft tissue lesion within the medial aspect of the left orbit just superior to the medial rectus muscle. Orbital MRI on 02/15/2015 revealed a 7 x10 x 9 mm lesion on the left superior oblique muscle and multiple focal areas of enhancement (cerebellum, left basal ganglia, thalamus, and anteromedial right frontal lobe) concerning for metastatic disease.  Bone scan on 02/15/2015 revealed abnormal uptake throughout the calvarium, ribs, vertebral bodies, pelvis, bilateral femurs and shoulders consistent with metastatic disease. Plain films reveal no evidence of impending fracture.  Colonoscopy on 02/15/2015 revealed a frond-like/villous partially obstructing non-circumferential mass in the cecum. 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 CEA was 333.8 on 02/14/2015.  CT guided right iliac crest biopsy on 02/19/2015 confirmed metastatic small cell carcinoma of the lung.  CK7, TTF1, CD56 were positive.  CK20 and CDX-2 were negative.  Labs revealed no evidence of myeloma (SPEP, free light chains). 24 hour urine revealed < 22.4 mg/24 hours of an M spike. There was no evidence of lymphoma. Serologies revealed  prior hepatitis B infection.  Symptomatically, she has intractable bone pain.  Oral intake is minimal.  She wishes to pursue treatment.  Plan: 1. Admission to hospital for pain control, IV fluids, and nutritional evaluation. 2. Discuss metastatic small cell lung cancer.  Discuss Hospice versus palliative treatment with carboplatin and etoposide.  Discuss side effects of chemotherapy.  Patient wishes to pursue treatment.  Discuss follow-up with radiation oncology for cranial radiation.  Discuss port-a-cath placement.  3. Labs today:  CBC with diff, CMP, PT/INR. 4. Preauth carboplatin and etoposide every 3 weeks x 4-6 cycles. 5. Samara Deist for bone metastasis. 6. Discuss code status.  Patient agrees to DNR/DNI code status.. 7.   RTC for MD assessment, labs, and initiation of chemotherapy.   Lequita Asal, MD  02/25/2015, 3:35 PM

## 2015-02-25 NOTE — H&P (Signed)
[place] [facility] [place] NAME: Brandy Odom    MR#:  [id number]  DATE OF BIRTH:  31-Aug-1950  DATE OF ADMISSION:  02/25/2015  PRIMARY CARE PHYSICIAN: No PCP Per Patient   REQUESTING/REFERRING PHYSICIAN: Mike Gip, MD  CHIEF COMPLAINT:  No chief complaint on file.   HISTORY OF PRESENT ILLNESS:  Brandy Odom  is a 64 y.o. female who presents from oncology clinic for dehydration and intractable pain. Patient has recent diagnosis of metastatic cancer found on recent biopsy to be small cell lung cancer. She states that she was discharged home from the hospital after an admission which found this diagnosis, but was unable to manage her pain at home. She went to the oncology clinic today for follow-up appointment and to her pain and dehydration was sent for direct admission.  PAST MEDICAL HISTORY:   Past Medical History  Diagnosis Date  . Tobacco abuse   . Muscle spasm   . Cancer Brandy Odom)     PAST SURGICAL HISTORY:   Past Surgical History  Procedure Laterality Date  . Appendectomy    . Colonoscopy with propofol N/A 02/15/2015    Procedure: COLONOSCOPY WITH PROPOFOL;  Surgeon: Lucilla Lame, MD;  Location: [facility] ENDOSCOPY;  Service: Endoscopy;  Laterality: N/A;    SOCIAL HISTORY:   Social History  Substance Use Topics  . Smoking status: Current Every Day Smoker    Types: Cigarettes  . Smokeless tobacco: Not on file  . Alcohol Use: No    FAMILY HISTORY:   Family History  Problem Relation Age of Onset  . Colon cancer      DRUG ALLERGIES:   Allergies  Allergen Reactions  . Bee Venom Anaphylaxis  . Naproxen Nausea And Vomiting  . Sulfa Antibiotics Other (See Comments)    Reaction:  Unknown   . Antihistamines, Chlorpheniramine-Type Palpitations    MEDICATIONS AT HOME:   Prior to Admission medications   Medication Sig Start Date End Date Taking? Authorizing Provider  diazepam (VALIUM) 5 MG tablet Take 1 tablet (5 mg total)  by mouth every 8 (eight) hours as needed for muscle spasms. 02/22/15 02/22/16  Lequita Asal, MD  docusate sodium (COLACE) 100 MG capsule Take 1 capsule (100 mg total) by mouth 2 (two) times daily. 02/19/15   Bettey Costa, MD  fentaNYL (DURAGESIC - DOSED MCG/HR) 25 MCG/HR patch Place 1 patch (25 mcg total) onto the skin every 3 (three) days. 02/19/15   Bettey Costa, MD  HYDROcodone-acetaminophen (NORCO) 5-325 MG tablet Take 1 tablet by mouth every 4 (four) hours as needed for moderate pain. 02/22/15   Lequita Asal, MD  nicotine (NICODERM CQ - DOSED IN MG/24 HOURS) 21 mg/24hr patch Place 1 patch (21 mg total) onto the skin daily. 02/19/15   Bettey Costa, MD  nystatin (MYCOSTATIN) 100000 UNIT/ML suspension Take 5 mLs (500,000 Units total) by mouth 4 (four) times daily. 02/19/15   Bettey Costa, MD    REVIEW OF SYSTEMS:  Review of Systems  Constitutional: Positive for weight loss and malaise/fatigue. Negative for fever and chills.  HENT: Negative for ear pain, hearing loss and tinnitus.   Eyes: Negative for blurred vision, double vision, pain and redness.  Respiratory: Negative for cough, hemoptysis and shortness of breath.   Cardiovascular: Positive for chest pain (chest wall pain). Negative for palpitations, orthopnea and leg swelling.  Gastrointestinal: Positive for abdominal pain. Negative for nausea, vomiting, diarrhea and constipation.  Genitourinary: Negative for dysuria, frequency and hematuria.  Musculoskeletal: Negative for back pain, joint pain and neck pain.  Skin:       No acne, rash, or lesions  Neurological: Negative for dizziness, tremors, focal weakness and weakness.  Endo/Heme/Allergies: Negative for polydipsia. Does not bruise/bleed easily.  Psychiatric/Behavioral: Negative for depression. The patient is not nervous/anxious and does not have insomnia.      VITAL SIGNS:   Filed Vitals:   02/25/15 1800 02/25/15 2000 02/25/15 2104  BP: 122/79  119/69  Pulse: 88  83  Temp: 98.2  F (36.8 C)  98 F (36.7 C)  TempSrc: Oral    Resp: 20  17  Height: '5\' 4"'$  (1.626 m) '5\' 4"'$  (1.626 m)   Weight:  62.778 kg (138 lb 6.4 oz)   SpO2: 99%  98%   Wt Readings from Last 3 Encounters:  02/25/15 62.778 kg (138 lb 6.4 oz)  02/15/15 63.05 kg (139 lb)  02/09/15 68.04 kg (150 lb)    PHYSICAL EXAMINATION:  Physical Exam  Vitals reviewed. Constitutional: She is oriented to person, place, and time. She appears well-developed and well-nourished. No distress.  HENT:  Head: Normocephalic and atraumatic.  Eyes: Conjunctivae and EOM are normal. Pupils are equal, round, and reactive to light. No scleral icterus.  Neck: Normal range of motion. Neck supple. No JVD present. No thyromegaly present.  Cardiovascular: Normal rate, regular rhythm and intact distal pulses.  Exam reveals no gallop and no friction rub.   No murmur heard. Respiratory: Effort normal and breath sounds normal. No respiratory distress. She has no wheezes. She has no rales. She exhibits tenderness (Central tenderness to palpation).  GI: Soft. Bowel sounds are normal. She exhibits no distension. There is tenderness (Left upper quadrant tenderness).  Musculoskeletal: Normal range of motion. She exhibits no edema.  No arthritis, no gout  Lymphadenopathy:    She has no cervical adenopathy.  Neurological: She is alert and oriented to person, place, and time. No cranial nerve deficit.  No dysarthria, no aphasia  Skin: Skin is warm and dry. No rash noted. No erythema.  Psychiatric: She has a normal mood and affect. Her behavior is normal. Judgment and thought content normal.    LABORATORY PANEL:   CBC  Recent Labs Lab 02/25/15 1612  WBC 7.0  HGB 12.5  HCT 37.8  PLT 162   ------------------------------------------------------------------------------------------------------------------  Chemistries   Recent Labs Lab 02/25/15 1612  NA 143  K 3.4*  CL 103  CO2 32  GLUCOSE 103*  BUN 7  CREATININE 0.80   CALCIUM 9.7  AST 33  ALT 41  ALKPHOS 173*  BILITOT 0.8   ------------------------------------------------------------------------------------------------------------------  Cardiac Enzymes No results for input(s): TROPONINI in the last 168 hours. ------------------------------------------------------------------------------------------------------------------  RADIOLOGY:  No results found.  EKG:   Orders placed or performed during the hospital encounter of 02/12/15  . ED EKG  . ED EKG    IMPRESSION AND PLAN:  Principal Problem:   Intractable pain - pain is present but somewhat improved with current pain regimen inpatient. We'll continue to monitor tonight to continue good handle on her pain medication requirement. Active Problems:   Metastasis ([place]) - widespread metastases with bony metastases. Likely source of her significant pain. Oncology consult.   Dehydration - gentle fluids tonight for rehydration   Small cell lung cancer [facility]) - oncology consult as above. Patient states she is to have a port placed, and this may be better done while she is here in the hospital. We'll defer to oncology's recommendation and planning  for this.  All the records are reviewed and case discussed with ED provider. Management plans discussed with the patient and/or family.  DVT PROPHYLAXIS: SubQ heparin  ADMISSION STATUS: Observation  CODE STATUS:     Code Status Orders        Start     Ordered   02/25/15 1935  Do not attempt resuscitation (DNR)   Continuous    Question Answer Comment  In the event of cardiac or respiratory ARREST Do not call a "code blue"   In the event of cardiac or respiratory ARREST Do not perform Intubation, CPR, defibrillation or ACLS   In the event of cardiac or respiratory ARREST Use medication by any route, position, wound care, and other measures to relive pain and suffering. May use oxygen, suction and manual treatment of airway obstruction as needed for  comfort.   Comments Discussed with the patient and her husband      02/25/15 1935    DNR  TOTAL TIME TAKING CARE OF THIS PATIENT: 35 minutes.    Ansen Sayegh [place] 02/25/2015, 9:50 PM  [facility]  Office  [phone]  CC: Primary care physician; No PCP Per Patient

## 2015-02-26 ENCOUNTER — Encounter: Payer: Self-pay | Admitting: Hematology and Oncology

## 2015-02-26 ENCOUNTER — Other Ambulatory Visit: Payer: Self-pay | Admitting: Hematology and Oncology

## 2015-02-26 DIAGNOSIS — F4323 Adjustment disorder with mixed anxiety and depressed mood: Secondary | ICD-10-CM

## 2015-02-26 DIAGNOSIS — C7401 Malignant neoplasm of cortex of right adrenal gland: Secondary | ICD-10-CM

## 2015-02-26 DIAGNOSIS — C7402 Malignant neoplasm of cortex of left adrenal gland: Secondary | ICD-10-CM

## 2015-02-26 DIAGNOSIS — C7960 Secondary malignant neoplasm of unspecified ovary: Secondary | ICD-10-CM

## 2015-02-26 DIAGNOSIS — C3490 Malignant neoplasm of unspecified part of unspecified bronchus or lung: Secondary | ICD-10-CM

## 2015-02-26 DIAGNOSIS — C7A021 Malignant carcinoid tumor of the cecum: Secondary | ICD-10-CM

## 2015-02-26 DIAGNOSIS — C778 Secondary and unspecified malignant neoplasm of lymph nodes of multiple regions: Secondary | ICD-10-CM

## 2015-02-26 DIAGNOSIS — R52 Pain, unspecified: Secondary | ICD-10-CM

## 2015-02-26 MED ORDER — BOOST / RESOURCE BREEZE PO LIQD
1.0000 | Freq: Three times a day (TID) | ORAL | Status: DC
  Administered 2015-02-26 – 2015-02-27 (×2): 1 via ORAL

## 2015-02-26 MED ORDER — FENTANYL 75 MCG/HR TD PT72
75.0000 ug | MEDICATED_PATCH | TRANSDERMAL | Status: DC
  Administered 2015-02-28 – 2015-03-03 (×2): 75 ug via TRANSDERMAL
  Filled 2015-02-26 (×2): qty 1

## 2015-02-26 MED ORDER — POTASSIUM CHLORIDE CRYS ER 20 MEQ PO TBCR
20.0000 meq | EXTENDED_RELEASE_TABLET | Freq: Once | ORAL | Status: AC
  Administered 2015-02-26: 14:00:00 20 meq via ORAL
  Filled 2015-02-26: qty 1

## 2015-02-26 MED ORDER — QUETIAPINE FUMARATE 25 MG PO TABS
25.0000 mg | ORAL_TABLET | Freq: Every day | ORAL | Status: DC
  Administered 2015-02-26 – 2015-03-04 (×6): 25 mg via ORAL
  Filled 2015-02-26 (×8): qty 1

## 2015-02-26 MED ORDER — MEGESTROL ACETATE 40 MG/ML PO SUSP
200.0000 mg | Freq: Every day | ORAL | Status: DC
  Administered 2015-02-26 – 2015-03-06 (×7): 200 mg via ORAL
  Filled 2015-02-26 (×11): qty 5

## 2015-02-26 MED ORDER — SENNA 8.6 MG PO TABS
1.0000 | ORAL_TABLET | Freq: Every day | ORAL | Status: DC
Start: 2015-02-26 — End: 2015-02-27
  Administered 2015-02-26 – 2015-02-27 (×2): 8.6 mg via ORAL
  Filled 2015-02-26 (×2): qty 1

## 2015-02-26 MED ORDER — OXYCODONE HCL ER 15 MG PO T12A
15.0000 mg | EXTENDED_RELEASE_TABLET | Freq: Two times a day (BID) | ORAL | Status: DC
  Administered 2015-02-26 – 2015-03-06 (×17): 15 mg via ORAL
  Filled 2015-02-26 (×18): qty 1

## 2015-02-26 NOTE — Progress Notes (Signed)
Visited with patient and gave compassionate presence.  Patient resting. [place] 1200

## 2015-02-26 NOTE — Progress Notes (Signed)
Initial Nutrition Assessment  DOCUMENTATION CODES:   Severe malnutrition in context of chronic illness  INTERVENTION:   Meals and Snacks: Cater to patient preferences such as homemade milkshakes which pt likes Medical Food Supplement Therapy: will recommend Boost Breeze po TID, each supplement provides 250 kcal and 9 grams of protein, as pt reports Ensure is too thick currently   NUTRITION DIAGNOSIS:   Malnutrition related to cancer and cancer related treatments as evidenced by energy intake < or equal to 75% for > or equal to 1 month, percent weight loss, severe depletion of muscle mass.  GOAL:   Patient will meet greater than or equal to 90% of their needs  MONITOR:    (Energy Intake, Anthropometrics, Digestive System, Electrolyte and renal Profile)  REASON FOR ASSESSMENT:   Malnutrition Screening Tool    ASSESSMENT:   Pt admitted with intractible pain. Pt with recent diagnosis of small cell lung cancer with bony mets; oncology following.  Past Medical History  Diagnosis Date  . Tobacco abuse   . Muscle spasm   . Small cell lung cancer ([place])     Metastatic     Diet Order:  Diet regular Room service appropriate?: Yes; Fluid consistency:: Thin    Current Nutrition: Pt reports eating a few bites of pancakes this am.   Food/Nutrition-Related History: Pt reports very poor po intake since last admission 2 weeks ago. Pt reports trying to drink Ensure but feels the consistency is too thick. Pt reports sister-in-law brought her Ensure Clear, lime flavor that she was tolerating but only had one the night before admission.    Scheduled Medications:  . docusate sodium  100 mg Oral BID  . feeding supplement  1 Container Oral TID WC  . [START ON 02/28/2015] fentaNYL  75 mcg Transdermal Q72H  . heparin  5,000 Units Subcutaneous 3 times per day  . nicotine  21 mg Transdermal Daily  . nystatin  5 mL Oral QID  . oxyCODONE  15 mg Oral Q12H  . potassium chloride  20 mEq Oral  Once  . senna  1 tablet Oral Daily    Continuous Medications:  . sodium chloride 75 mL/hr at 02/26/15 0853     Electrolyte/Renal Profile and Glucose Profile:   Recent Labs Lab 02/25/15 1612  NA 143  K 3.4*  CL 103  CO2 32  BUN 7  CREATININE 0.80  CALCIUM 9.7  GLUCOSE 103*   Protein Profile:  Recent Labs Lab 02/25/15 1612  ALBUMIN 3.1*    Gastrointestinal Profile: Last BM: 02/25/2015   Nutrition-Focused Physical Exam Findings: Nutrition-Focused physical exam completed. Findings are no fat depletion, moderate-severe muscle depletion of lower extremities only, and no edema.     Weight Change: Pt reports 35lbs weight loss since September (20% weight loss in 4 months)   Height:   Ht Readings from Last 1 Encounters:  02/25/15 '5\' 4"'$  (1.626 m)    Weight:   Wt Readings from Last 1 Encounters:  02/26/15 140 lb (63.504 kg)   Wt Readings from Last 10 Encounters:  02/26/15 140 lb (63.504 kg)  02/15/15 139 lb (63.05 kg)  02/09/15 150 lb (68.04 kg)  02/04/15 145 lb (65.772 kg)     BMI:  Body mass index is 24.02 kg/(m^2).  Estimated Nutritional Needs:   Kcal:  BEE: 1170kcals, TEE: (IF 1.1-1.3)(AF 1.2) 1544-1825kcals  Protein:  69-82g protein (1.1-1.3g/kg)  Fluid:  1575-1835m of fluid (25-333mkg)  EDUCATION NEEDS:   No education needs identified at this time  HIGH Care Level  Dwyane Luo, [place], [place] Pager [phone]

## 2015-02-26 NOTE — Consult Note (Signed)
Surgical Consultation  02/26/2015  Brandy Odom is an 64 y.o. female.   CC: Bone pain  HPI: This a patient with metastatic lung cancer Brandy Odom carcinoid. I spoke directly with Dr. Adonis Huguenin and then with Dr. Mike Gip concerning port placement. She is here for intractable bone pain and is planning to start radiotherapy on Monday. The plan was for placement of a port tomorrow as the patient will need chemotherapy after radiotherapy.  Past Medical History  Diagnosis Date  . Tobacco abuse   . Muscle spasm   . 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 Appendectomy    . Colonoscopy with propofol N/A 02/15/2015    Procedure: COLONOSCOPY WITH PROPOFOL;  Surgeon: Lucilla Lame, MD;  Location: [facility] ENDOSCOPY;  Service: Endoscopy;  Laterality: N/A;    Family History  Problem Relation Age of Onset  . Colon cancer      Social History:  reports that she has been smoking Cigarettes.  She does not have any smokeless tobacco history on file. She reports that she does not drink alcohol or use illicit drugs.  Allergies:  Allergies  Allergen Reactions  . Bee Venom Anaphylaxis  . Naproxen Nausea And Vomiting  . Sulfa Antibiotics Other (See Comments)    Reaction:  Unknown   . Antihistamines, Chlorpheniramine-Type Palpitations    Medications reviewed.   Review of Systems:   Review of Systems  Constitutional: Negative for fever and chills.  Eyes: Negative.   Respiratory: Negative.   Cardiovascular: Negative.   Gastrointestinal: Negative.   Genitourinary: Negative.   Musculoskeletal: Positive for myalgias, back pain, joint pain and neck pain.  Skin: Negative.   Neurological: Positive for tingling.  Endo/Heme/Allergies: Negative.   Psychiatric/Behavioral: Negative.      Physical Exam:  BP 136/60 mmHg  Pulse 81  Temp(Src) 98.1 F (36.7 C) (Oral)  Resp 18  Ht _0  (1.626 m)  Wt 140 lb (63.504 kg)  BMI 24.02 kg/m2  SpO2  96%  Physical Exam  Constitutional: She is oriented to person, place, and time. No distress.  HENT:  Head: Normocephalic and atraumatic.  Eyes: Right eye exhibits no discharge. Left eye exhibits no discharge. No scleral icterus.  Neck: Normal range of motion. Neck supple.  Cardiovascular: Normal rate and regular rhythm.   Pulmonary/Chest: Effort normal and breath sounds normal.  Abdominal: Soft. She exhibits no distension. There is no tenderness.  Musculoskeletal: Normal range of motion.  Neurological: She is alert and oriented to person, place, and time.  Skin: Skin is warm and dry. She is not diaphoretic.  Psychiatric: Memory and affect normal.      Results for orders placed or performed in visit on 02/25/15 (from the past 48 hour(s))  CBC with Differential     Status: Abnormal   Collection Time: 02/25/15  4:12 PM  Result Value Ref Range   WBC 7.0 3.6 - 11.0 K/uL   RBC 4.64 3.80 - 5.20 MIL/uL   Hemoglobin 12.5 12.0 - 16.0 g/dL   HCT 37.8 35.0 - 47.0 %   MCV 81.4 80.0 - 100.0 fL   MCH 27.0 26.0 - 34.0 pg   MCHC 33.2 32.0 - 36.0 g/dL   RDW 17.3 (H) 11.5 - 14.5 %   Platelets 162 150 - 440 K/uL   Neutrophils Relative % 80 %   Neutro Abs 5.6 1.4 - 6.5 K/uL   Lymphocytes Relative 13 %   Lymphs  Abs 0.9 (L) 1.0 - 3.6 K/uL   Monocytes Relative 5 %   Monocytes Absolute 0.4 0.2 - 0.9 K/uL   Eosinophils Relative 1 %   Eosinophils Absolute 0.1 0 - 0.7 K/uL   Basophils Relative 1 %   Basophils Absolute 0.0 0 - 0.1 K/uL  Comprehensive metabolic panel     Status: Abnormal   Collection Time: 02/25/15  4:12 PM  Result Value Ref Range   Sodium 143 135 - 145 mmol/L   Potassium 3.4 (L) 3.5 - 5.1 mmol/L   Chloride 103 101 - 111 mmol/L   CO2 32 22 - 32 mmol/L   Glucose, Bld 103 (H) 65 - 99 mg/dL   BUN 7 6 - 20 mg/dL   Creatinine, Ser 0.80 0.44 - 1.00 mg/dL   Calcium 9.7 8.9 - 10.3 mg/dL   Total Protein 6.8 6.5 - 8.1 g/dL   Albumin 3.1 (L) 3.5 - 5.0 g/dL   AST 33 15 - 41 U/L   ALT  41 14 - 54 U/L   Alkaline Phosphatase 173 (H) 38 - 126 U/L   Total Bilirubin 0.8 0.3 - 1.2 mg/dL   GFR calc non Af Amer >60 >60 mL/min   GFR calc Af Amer >60 >60 mL/min    Comment: (NOTE) The eGFR has been calculated using the CKD EPI equation. This calculation has not been validated in all clinical situations. eGFR's persistently <60 mL/min signify possible Chronic Kidney Disease.    Anion gap 8 5 - 15  Protime-INR     Status: Abnormal   Collection Time: 02/25/15  4:12 PM  Result Value Ref Range   Prothrombin Time 15.9 (H) 11.4 - 15.0 seconds   INR 1.26    No results found.  Assessment/Plan:  Metastatic lung cancer and carcinoid. I was asked to discuss port placement with the patient. Of note Dr. Adonis Huguenin had her scheduled on December 2 and that was canceled partly because of unclear diagnoses and the patient's desire to delay. He has now decided to have it placed and I was asked see the patient concerning this. Of note the patient has radiotherapy scheduled on Monday and chemotherapy will not start for at least another week or 2.  I discussed port placement with the patient and discussed in detail including the risks of bleeding infection thrombosis nonfunction breakage pneumothorax hemopneumothorax any of which could require further surgery or chest tube placement.  He then asked me if we can wait at least a few more days to place it. With her reluctance to place the port I would want her to at least think about this and I certainly would not do it tomorrow as she would not consent to it. The surgical service will be available as needed.  Florene Glen, MD, FACS

## 2015-02-26 NOTE — Consult Note (Signed)
[facility] Face-to-Face Psychiatry Consult   Reason for Consult:  Consult for this 64 year old woman with a history of anxiety and depression now with a new diagnosis of metastatic lung cancer and pain Referring Physician:  Gouru Patient Identification: Brandy Odom MRN:  [id number] Principal Diagnosis: Adjustment disorder with anxiety and depression Diagnosis:   Patient Active Problem List   Diagnosis Date Noted  . Adjustment disorder with mixed anxiety and depressed mood [F43.23] 02/26/2015  . Small cell lung cancer ([place]) [C34.90] 02/25/2015  . 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 Tumor associated pain [G89.3] 02/25/2015  . Dehydration [E86.0] 02/25/2015  . Intractable pain [R52] 02/25/2015  . 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 Abnormal radionuclide bone scan [R94.8]   . Adenopathy [R59.1]   . Abnormal CT of the abdomen [R93.5]   . Protein-calorie malnutrition, severe [E43] 02/14/2015  . Bone lesion [M89.9]   . Lesion of lung [J98.4]   . Orbital lesion [H05.89]   . Dyspnea [R06.00] 02/12/2015  . Cough [R05] 02/12/2015  . Tobacco abuse [Z72.0] 02/12/2015  . Metastasis ([place]) [C79.9] 02/12/2015  . Hypoxia [R09.02] 02/12/2015    Total Time spent with patient: 1 hour  Subjective:   Brandy Odom is a 64 y.o. female patient admitted with "I just felt the panic attacks coming back".  HPI:  Information from the patient and the chart. Patient interviewed. Chart reviewed including current and older records and older psychiatric notes. Labs reviewed. Patient states that she has been noticing an increase in her anxiety and depression. She was recently diagnosed with metastatic lung cancer and is having worsening pain that she's having trouble managing at home. Patient says that over the last week or so she has been feeling more nervous. On several occasions she has had a panic attack with acute palpitations and  nervousness. Additionally she thinks that her mood is getting worse. She has felt some worsening of depression but completely denies any suicidal ideation. She's had a little bit more trouble sleeping. She wakes up frequently and feels more restless. Patient denies feeling hopeless but is having some more down and negative thoughts than she would like. She has felt like she was on the verge of crying on several occasions which bothers her. Patient has not been abusing drugs or alcohol. She currently takes Valium 5 mg 3 times a day as an outpatient but does not take any other psychiatric medicine.  Social history: Patient lives with her husband. Good relationship between them.  Medical history: Recent diagnosis of metastatic lung cancer. Severe pain as a result.  Substance abuse history: Patient has a past history of alcohol abuse problems. She stopped drinking in 2012 and has not had any alcohol since then. She is active in sobriety groups.  Past Psychiatric History: Patient had one psychiatric hospitalization in 2012 which was largely for alcohol withdrawal. She has no history of suicide attempts. She had been treated in the past with antidepressant medicine and had bad reactions to them. States that antidepressants made her feel jittery and even more sad and depressed. She has been on low doses of Valium unless stated taking basis for years. Does not describe any manias or psychotic symptoms.  Risk to Self: Is patient at risk for suicide?: No Risk to Others:   Prior Inpatient Therapy:   Prior Outpatient Therapy:    Past Medical History:  Past Medical History  Diagnosis Date  .  Tobacco abuse   . Muscle spasm   . 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 Appendectomy    . Colonoscopy with propofol N/A 02/15/2015    Procedure: COLONOSCOPY WITH PROPOFOL;  Surgeon: Lucilla Lame, MD;  Location: [facility] ENDOSCOPY;  Service: Endoscopy;   Laterality: N/A;   Family History:  Family History  Problem Relation Age of Onset  . Colon cancer     Family Psychiatric  History: Patient states there is a family history of mental illness with both her parents having depression. No history of suicide in the family. Social History:  History  Alcohol Use No     History  Drug Use No    Social History   Social History  . Marital Status: Married    Spouse Name: N/A  . Number of Children: N/A  . Years of Education: N/A   Social History Main Topics  . Smoking status: Current Every Day Smoker    Types: Cigarettes  . Smokeless tobacco: None  . Alcohol Use: No  . Drug Use: No  . Sexual Activity: Not Asked   Other Topics Concern  . None   Social History Narrative   Additional Social History:                          Allergies:   Allergies  Allergen Reactions  . Bee Venom Anaphylaxis  . Naproxen Nausea And Vomiting  . Sulfa Antibiotics Other (See Comments)    Reaction:  Unknown   . Antihistamines, Chlorpheniramine-Type Palpitations    Labs:  Results for orders placed or performed in visit on 02/25/15 (from the past 48 hour(s))  CBC with Differential     Status: Abnormal   Collection Time: 02/25/15  4:12 PM  Result Value Ref Range   WBC 7.0 3.6 - 11.0 K/uL   RBC 4.64 3.80 - 5.20 MIL/uL   Hemoglobin 12.5 12.0 - 16.0 g/dL   HCT 37.8 35.0 - 47.0 %   MCV 81.4 80.0 - 100.0 fL   MCH 27.0 26.0 - 34.0 pg   MCHC 33.2 32.0 - 36.0 g/dL   RDW 17.3 (H) 11.5 - 14.5 %   Platelets 162 150 - 440 K/uL   Neutrophils Relative % 80 %   Neutro Abs 5.6 1.4 - 6.5 K/uL   Lymphocytes Relative 13 %   Lymphs Abs 0.9 (L) 1.0 - 3.6 K/uL   Monocytes Relative 5 %   Monocytes Absolute 0.4 0.2 - 0.9 K/uL   Eosinophils Relative 1 %   Eosinophils Absolute 0.1 0 - 0.7 K/uL   Basophils Relative 1 %   Basophils Absolute 0.0 0 - 0.1 K/uL  Comprehensive metabolic panel     Status: Abnormal   Collection Time: 02/25/15  4:12 PM  Result  Value Ref Range   Sodium 143 135 - 145 mmol/L   Potassium 3.4 (L) 3.5 - 5.1 mmol/L   Chloride 103 101 - 111 mmol/L   CO2 32 22 - 32 mmol/L   Glucose, Bld 103 (H) 65 - 99 mg/dL   BUN 7 6 - 20 mg/dL   Creatinine, Ser 0.80 0.44 - 1.00 mg/dL   Calcium 9.7 8.9 - 10.3 mg/dL   Total Protein 6.8 6.5 - 8.1 g/dL   Albumin 3.1 (L) 3.5 - 5.0 g/dL   AST 33 15 - 41 U/L   ALT 41 14 - 54  U/L   Alkaline Phosphatase 173 (H) 38 - 126 U/L   Total Bilirubin 0.8 0.3 - 1.2 mg/dL   GFR calc non Af Amer >60 >60 mL/min   GFR calc Af Amer >60 >60 mL/min    Comment: (NOTE) The eGFR has been calculated using the CKD EPI equation. This calculation has not been validated in all clinical situations. eGFR's persistently <60 mL/min signify possible Chronic Kidney Disease.    Anion gap 8 5 - 15  Protime-INR     Status: Abnormal   Collection Time: 02/25/15  4:12 PM  Result Value Ref Range   Prothrombin Time 15.9 (H) 11.4 - 15.0 seconds   INR 1.26     Current Facility-Administered Medications  Medication Dose Route Frequency Provider Last Rate Last Dose  . acetaminophen (TYLENOL) tablet 650 mg  650 mg Oral Q6H PRN Aldean Jewett, MD       Or  . acetaminophen (TYLENOL) suppository 650 mg  650 mg Rectal Q6H PRN Aldean Jewett, MD      . diazepam (VALIUM) tablet 5 mg  5 mg Oral Q8H PRN Aldean Jewett, MD   5 mg at 02/26/15 2141  . docusate sodium (COLACE) capsule 100 mg  100 mg Oral BID Aldean Jewett, MD   100 mg at 02/26/15 2142  . feeding supplement (BOOST / RESOURCE BREEZE) liquid 1 Container  1 Container Oral TID WC Nicholes Mango, MD   1 Container at 02/26/15 1657  . [START ON 02/28/2015] fentaNYL (DURAGESIC - dosed mcg/hr) 75 mcg  75 mcg Transdermal Q72H Dustin Flock, MD      . heparin injection 5,000 Units  5,000 Units Subcutaneous 3 times per day Aldean Jewett, MD   5,000 Units at 02/26/15 2142  . HYDROcodone-acetaminophen (NORCO/VICODIN) 5-325 MG per tablet 1 tablet  1 tablet Oral Q4H PRN  Aldean Jewett, MD   1 tablet at 02/26/15 2142  . HYDROmorphone (DILAUDID) injection 1 mg  1 mg Intravenous Q4H PRN Aldean Jewett, MD   1 mg at 02/26/15 1604  . megestrol (MEGACE) 40 MG/ML suspension 200 mg  200 mg Oral Daily Lequita Asal, MD   200 mg at 02/26/15 1604  . nicotine (NICODERM CQ - dosed in mg/24 hours) patch 21 mg  21 mg Transdermal Daily Aldean Jewett, MD   21 mg at 02/26/15 0853  . nystatin (MYCOSTATIN) 100000 UNIT/ML suspension 500,000 Units  5 mL Oral QID Aldean Jewett, MD   500,000 Units at 02/26/15 2142  . oxyCODONE (OXYCONTIN) 12 hr tablet 15 mg  15 mg Oral Q12H Dustin Flock, MD   15 mg at 02/26/15 2142  . QUEtiapine (SEROQUEL) tablet 25 mg  25 mg Oral QHS Gonzella Lex, MD      . senna (SENOKOT) tablet 8.6 mg  1 tablet Oral Daily Dustin Flock, MD   8.6 mg at 02/26/15 1346    Musculoskeletal: Strength & Muscle Tone: within normal limits Gait & Station: unable to stand Patient leans: N/A  Psychiatric Specialty Exam: Review of Systems  Constitutional: Positive for malaise/fatigue.  HENT: Negative.   Eyes: Negative.   Respiratory: Negative.   Cardiovascular: Positive for palpitations.  Gastrointestinal: Positive for abdominal pain.  Musculoskeletal: Positive for myalgias, back pain and joint pain.  Skin: Negative.   Neurological: Positive for weakness.  Psychiatric/Behavioral: Positive for depression. Negative for suicidal ideas, hallucinations, memory loss and substance abuse. The patient is nervous/anxious and has insomnia.  Blood pressure 138/55, pulse 78, temperature 98 F (36.7 C), temperature source Oral, resp. rate 20, height 5' 4"  (1.626 m), weight 63.504 kg (140 lb), SpO2 97 %.Body mass index is 24.02 kg/(m^2).  General Appearance: Fairly Groomed  Engineer, water::  Good  Speech:  Normal Rate  Volume:  Normal  Mood:  Anxious and Depressed  Affect:  Congruent  Thought Process:  Goal Directed  Orientation:  Full (Time, Place, and  Person)  Thought Content:  Negative  Suicidal Thoughts:  No  Homicidal Thoughts:  No  Memory:  Immediate;   Good Recent;   Good Remote;   Good  Judgement:  Intact  Insight:  Good  Psychomotor Activity:  Normal  Concentration:  Good  Recall:  Good  Fund of Knowledge:Good  Language: Good  Akathisia:  No  Handed:  Right  AIMS (if indicated):     Assets:  Communication Skills Desire for Improvement Financial Resources/Insurance Housing Intimacy Resilience Social Support  ADL's:  Intact  Cognition: WNL  Sleep:      Treatment Plan Summary: Daily contact with patient to assess and evaluate symptoms and progress in treatment, Medication management and Plan Patient with a history of anxiety and depression but no history of suicide attempts or psychosis. Past history of bad reactions to serotonin reuptake inhibitors. She is hoping that she can find something that may help with her nerves and sleep a little bit. I discussed the limited options when we rule out all antidepressants. I suggested since she is complaining of difficulty sleeping and some anxiety that we try a modest 25 mg dose of quetiapine at night. This may help with sleep and anxiety. If she tolerates it well we could try lower doses during the day as well if needed. Continue the Valium as prescribed. Supportive counseling and encouragement. I will follow up on Monday if additional help is needed over the weekend please contact the psychiatrist on call.  Disposition: Patient does not meet criteria for psychiatric inpatient admission. Supportive therapy provided about ongoing stressors.  John Clapacs 02/26/2015 10:35 PM

## 2015-02-26 NOTE — Progress Notes (Signed)
-  C/o rib cage pain relieved by PRN Dilaudid & Vicodin -VSS, afebrile, IVF infusing as ordered  -oncology consulted, sees Dr. Mike Gip  -surgery consulted for port-a-cath placement  -anxious through the night, emotional support given w/ noted relief  -High fall risk. Bed alarm on, hourly rounding w/ toileting offered. Pt understands how to use call system for assistance.

## 2015-02-26 NOTE — Progress Notes (Signed)
[facility]  Date of admission:  02/25/2015  Inpatient day:  02/26/2015   Consulting physician:  Dr. Nicholes Mango   Chief Complaint: Brandy Odom is a 64 y.o. female with metastatic small cell lung cancer who was admitted with intractable diffuse bone pain and poor oral intake.  Subjective:  Feels a little better today.  Trying to eat.  Past Medical History  Diagnosis Date  . Tobacco abuse   . Muscle spasm   . Small cell lung cancer [facility])     Metastatic    Past Surgical History  Procedure Laterality Date  . Appendectomy    . Colonoscopy with propofol N/A 02/15/2015    Procedure: COLONOSCOPY WITH PROPOFOL;  Surgeon: Lucilla Lame, MD;  Location: [facility] ENDOSCOPY;  Service: Endoscopy;  Laterality: N/A;    Family History  Problem Relation Age of Onset  . Colon cancer      Social History:  reports that she has been smoking Cigarettes.  She does not have any smokeless tobacco history on file. She reports that she does not drink alcohol or use illicit drugs.  She lives in [place] with her husband, [name].  She is alone today.  Allergies:  Allergies  Allergen Reactions  . Bee Venom Anaphylaxis  . Naproxen Nausea And Vomiting  . Sulfa Antibiotics Other (See Comments)    Reaction:  Unknown   . Antihistamines, Chlorpheniramine-Type Palpitations    Medications Prior to Admission  Medication Sig Dispense Refill  . diazepam (VALIUM) 5 MG tablet Take 1 tablet (5 mg total) by mouth every 8 (eight) hours as needed for muscle spasms. 20 tablet 0  . docusate sodium (COLACE) 100 MG capsule Take 1 capsule (100 mg total) by mouth 2 (two) times daily. 10 capsule 0  . fentaNYL (DURAGESIC - DOSED MCG/HR) 25 MCG/HR patch Place 1 patch (25 mcg total) onto the skin every 3 (three) days. 5 patch 0  . HYDROcodone-acetaminophen (NORCO) 5-325 MG tablet Take 1 tablet by mouth every 4 (four) hours as needed for moderate pain. 20 tablet 0  . nicotine (NICODERM CQ - DOSED IN MG/24  HOURS) 21 mg/24hr patch Place 1 patch (21 mg total) onto the skin daily. 28 patch 0  . nystatin (MYCOSTATIN) 100000 UNIT/ML suspension Take 5 mLs (500,000 Units total) by mouth 4 (four) times daily. 60 mL 0    Review of Systems: GENERAL:  Fatigue.  No fever or sweats.  Weight loss. PERFORMANCE STATUS (ECOG):  2-3 HEENT:  Diplopia on right lateral gaze.  No runny nose, sore throat, mouth sores or tenderness. Lungs:  Chronic shortness of breath.  Rare cough.  No hemoptysis. Cardiac:  Chest/rib pain (worse with any coughing).  No palpitations, orthopnea, or PND. GI:  Poor appetite.  Constipation.  No nausea, vomiting, diarrhea,melena or hematochezia. GU:  No urgency, frequency, dysuria, or hematuria. Musculoskeletal: Diffuse bone pain (ribs, back, legs).  No muscle tenderness. Extremities:  No swelling. Skin:  No rashes or skin changes. Neuro:  General weakness.  No headache, numbness or weakness, balance or coordination issues. Endocrine:  No diabetes, thyroid issues, hot flashes or night sweats. Psych:  Anxiety.  Depression. Pain:  Bone pain, diffuse (worse on left side). Review of systems:  All other systems reviewed and found to be negative.  Physical Exam:  Blood pressure 136/60, pulse 81, temperature 98.1 F (36.7 C), temperature source Oral, resp. rate 18, height 5' 4"  (1.626 m), weight 140 lb (63.504 kg), SpO2 96 %.  GENERAL:  Chronically  fatigued appearing woman lying comfortably on the medical unit in no acute distress. MENTAL STATUS:  Alert and oriented to person, place and time. HEAD:  Red shoulder length hair with dark roots.  Normocephalic, atraumatic, face symmetric, no Cushingoid features. EYES:  Brown eyes.  No conjunctivitis or scleral icterus. ENT: Oropharynx clear without lesion. Tongue normal. Mucous membranes moist.  RESPIRATORY: Scattered rhonchi. No wheezes. CARDIOVASCULAR: Regular rate and rhythm without murmur, rub or gallop. ABDOMEN: Soft, non-tender,  with active bowel sounds, and no appreciable hepatosplenomegaly. No masses. SKIN: No rashes or ulcers. EXTREMITIES: No edema, no skin discoloration or tenderness. No palpable cords. LYMPH NODES: No palpable cervical, supraclavicular, axillary or inguinal adenopathy  NEUROLOGICAL:  Stable. PSYCH: Appropriate.  Results for orders placed or performed in visit on 02/25/15 (from the past 48 hour(s))  CBC with Differential     Status: Abnormal   Collection Time: 02/25/15  4:12 PM  Result Value Ref Range   WBC 7.0 3.6 - 11.0 K/uL   RBC 4.64 3.80 - 5.20 MIL/uL   Hemoglobin 12.5 12.0 - 16.0 g/dL   HCT 37.8 35.0 - 47.0 %   MCV 81.4 80.0 - 100.0 fL   MCH 27.0 26.0 - 34.0 pg   MCHC 33.2 32.0 - 36.0 g/dL   RDW 17.3 (H) 11.5 - 14.5 %   Platelets 162 150 - 440 K/uL   Neutrophils Relative % 80 %   Neutro Abs 5.6 1.4 - 6.5 K/uL   Lymphocytes Relative 13 %   Lymphs Abs 0.9 (L) 1.0 - 3.6 K/uL   Monocytes Relative 5 %   Monocytes Absolute 0.4 0.2 - 0.9 K/uL   Eosinophils Relative 1 %   Eosinophils Absolute 0.1 0 - 0.7 K/uL   Basophils Relative 1 %   Basophils Absolute 0.0 0 - 0.1 K/uL  Comprehensive metabolic panel     Status: Abnormal   Collection Time: 02/25/15  4:12 PM  Result Value Ref Range   Sodium 143 135 - 145 mmol/L   Potassium 3.4 (L) 3.5 - 5.1 mmol/L   Chloride 103 101 - 111 mmol/L   CO2 32 22 - 32 mmol/L   Glucose, Bld 103 (H) 65 - 99 mg/dL   BUN 7 6 - 20 mg/dL   Creatinine, Ser 0.80 0.44 - 1.00 mg/dL   Calcium 9.7 8.9 - 10.3 mg/dL   Total Protein 6.8 6.5 - 8.1 g/dL   Albumin 3.1 (L) 3.5 - 5.0 g/dL   AST 33 15 - 41 U/L   ALT 41 14 - 54 U/L   Alkaline Phosphatase 173 (H) 38 - 126 U/L   Total Bilirubin 0.8 0.3 - 1.2 mg/dL   GFR calc non Af Amer >60 >60 mL/min   GFR calc Af Amer >60 >60 mL/min    Comment: (NOTE) The eGFR has been calculated using the CKD EPI equation. This calculation has not been validated in all clinical situations. eGFR's persistently <60 mL/min  signify possible Chronic Kidney Disease.    Anion gap 8 5 - 15  Protime-INR     Status: Abnormal   Collection Time: 02/25/15  4:12 PM  Result Value Ref Range   Prothrombin Time 15.9 (H) 11.4 - 15.0 seconds   INR 1.26    No results found.  Assessment:  The patient is a 64 y.o. woman with metastatic small cell lung cancer. She has a greater than 50 pack year smoking history. She presented with a 28 pound weight loss over 2 months, nausea, constipation, diplopia  on lateral gaze, and diffuse bone pain. Imaging studies reveal diffuse bone disease, mediastinal adenopathy, bilateral pulmonary nodules, bilateral adrenal metastasis, a suspicious liver lesion, and multiple brain metastasis.  CT guided right iliac crest biopsy on 02/19/2015 confirmed metastatic small cell carcinoma of the lung. CEA was 333.8 on 02/14/2015.  Colonoscopy on 02/15/2015 revealed a frond-like/villous partially obstructing non-circumferential mass in the cecum. 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 CEA was 333.8 on 02/14/2015.  Serologies revealed prior hepatitis B infection.  Symptomatically, her bone pain is slightly better. Oral intake is minimal.   Plan:   1.  Discuss titrating pain medications.  Fentanyl increased.  Previously Diluadid IV helped with breakthrough pain.  She also receives Valium prn for anxiety.  Consult palliative care medicine. 2.  Discuss addition of Megace for appetite stimulation.  Order written.  Nutrition services consulted. 3.  Surgery contacted regarding port-a-cath placement. 4.  Radiation oncology contacted regarding initiation of cranial radiation. 5.  Social work to assist with medicare coverage.  Thank you for allowing me to participate in Brandy Odom 's care.  I will follow her closely with you while hospitalized and after discharge in the outpatient department.   Lequita Asal, MD  02/26/2015, 4:50 PM

## 2015-02-26 NOTE — Care Management (Addendum)
Admitted to [facility] with the diagnosis of intractable pain. Lives with husband, Chrissie Noa. [phone]). Discharged from this facility 02/19/15. Follow-up vist was arranged in Dr. Kennith Gain office. Following this office visit, will be followed by [facility]. Shelbie Ammons RN MSN CCM Care Management [phone]

## 2015-02-26 NOTE — Progress Notes (Signed)
[place] [facility]                                                                                                                                                                                            Patient Demographics   Brandy Odom, is a 64 y.o. female, DOB - Apr 03, 1950, TDH:[id number]  Admit date - 02/25/2015   Admitting Physician Lequita Asal, MD  Outpatient Primary MD for the patient is No PCP Per Patient   LOS - 1  Subjective: Patient admitted with pain in her chest and abdomen. Recent diagnosis of lung cancer as well as colon cancer. Patient reports that she is feeling depressed     Review of Systems:   CONSTITUTIONAL: No documented fever. No fatigue, weakness. No weight gain, no weight loss.  EYES: No blurry or double vision.  ENT: No tinnitus. No postnasal drip. No redness of the oropharynx.  RESPIRATORY: No cough, no wheeze, no hemoptysis. No dyspnea.  CARDIOVASCULAR: No chest pain. No orthopnea. No palpitations. No syncope.  GASTROINTESTINAL: No nausea, no vomiting or diarrhea. No abdominal pain. No melena or hematochezia.  GENITOURINARY: No dysuria or hematuria.  ENDOCRINE: No polyuria or nocturia. No heat or cold intolerance.  HEMATOLOGY: No anemia. No bruising. No bleeding.  INTEGUMENTARY: No rashes. No lesions.  MUSCULOSKELETAL: Pain in multiple parts of her body  NEUROLOGIC: No numbness, tingling, or ataxia. No seizure-type activity.  PSYCHIATRIC: No anxiety. No insomnia. No ADD.    Vitals:   Filed Vitals:   02/25/15 1800 02/25/15 2000 02/25/15 2104 02/26/15 0532  BP: 122/79  119/69 124/51  Pulse: 88  83 76  Temp: 98.2 F (36.8 C)  98 F (36.7 C) 97.6 F (36.4 C)  TempSrc: Oral   Oral  Resp: 20  17 16   Height: 5' 4"  (1.626 m) 5' 4"  (1.626 m)    Weight:  62.778 kg (138 lb 6.4 oz)  63.504 kg (140 lb)  SpO2: 99%  98% 99%    Wt Readings from Last 3 Encounters:  02/26/15 63.504 kg (140 lb)  02/15/15  63.05 kg (139 lb)  02/09/15 68.04 kg (150 lb)     Intake/Output Summary (Last 24 hours) at 02/26/15 1216 Last data filed at 02/26/15 1141  Gross per 24 hour  Intake     75 ml  Output   1200 ml  Net  -1125 ml    Physical Exam:   GENERAL: Pleasant-appearing in no apparent distress.  HEAD, EYES, EARS, NOSE AND THROAT: Atraumatic, normocephalic. Extraocular muscles are intact. Pupils equal and reactive to light. Sclerae anicteric. No conjunctival injection.  No oro-pharyngeal erythema.  NECK: Supple. There is no jugular venous distention. No bruits, no lymphadenopathy, no thyromegaly.  HEART: Regular rate and rhythm,. No murmurs, no rubs, no clicks.  LUNGS: Clear to auscultation bilaterally. No rales or rhonchi. No wheezes.  ABDOMEN: Soft, flat, nontender, nondistended. Has good bowel sounds. No hepatosplenomegaly appreciated.  EXTREMITIES: No evidence of any cyanosis, clubbing, or peripheral edema.  +2 pedal and radial pulses bilaterally.  NEUROLOGIC: The patient is alert, awake, and oriented x3 with no focal motor or sensory deficits appreciated bilaterally.  SKIN: Moist and warm with no rashes appreciated.  Psych: Not anxious, depressed LN: No inguinal LN enlargement    Antibiotics   Anti-infectives    None      Medications   Scheduled Meds: . docusate sodium  100 mg Oral BID  . feeding supplement  1 Container Oral TID WC  . [START ON 02/28/2015] fentaNYL  75 mcg Transdermal Q72H  . heparin  5,000 Units Subcutaneous 3 times per day  . nicotine  21 mg Transdermal Daily  . nystatin  5 mL Oral QID   Continuous Infusions: . sodium chloride 75 mL/hr at 02/26/15 0853   PRN Meds:.acetaminophen **OR** acetaminophen, diazepam, HYDROcodone-acetaminophen, HYDROmorphone (DILAUDID) injection   Data Review:   Micro Results No results found for this or any previous visit (from the past 240 hour(s)).  Radiology Reports Dg Chest 2 View  02/12/2015  CLINICAL DATA:  Chest pain.  EXAM: CHEST  2 VIEW COMPARISON:  02/09/2015. FINDINGS: Mediastinum and hilar structures are normal. Bilateral subsegmental atelectasis and/or pleural parenchymal scarring again noted. No pleural effusion or pneumothorax. Heart size normal. No acute bony abnormality. IMPRESSION: Bilateral pleural-parenchymal atelectasis and are scarring. Chest is stable from prior exam . Electronically Signed   By: Marcello Moores  Register   On: 02/12/2015 16:35   Dg Chest 2 View  02/09/2015  CLINICAL DATA:  Chronic neck and arm pain. Cough and anterior chest pain. Initial encounter. EXAM: CHEST  2 VIEW COMPARISON:  Chest radiograph performed 09/06/2006 FINDINGS: The lungs are well-aerated. Minimal nodular opacities are noted in the periphery of the right lung. There is no evidence of pleural effusion or pneumothorax. The heart is borderline normal in size. No acute osseous abnormalities are seen. IMPRESSION: Minimal nodular opacities in the periphery of the right lung raise question for a mild infectious process. Electronically Signed   By: Garald Balding M.D.   On: 02/09/2015 06:09   Dg Cervical Spine 2-3 Views  02/04/2015  CLINICAL DATA:  Neck pain, left arm pain and numbness for about 2 weeks EXAM: CERVICAL SPINE - 2-3 VIEW COMPARISON:  None. FINDINGS: Four views of cervical spine submitted. No acute fracture or subluxation. Mild degenerative changes C1-C2 articulation. There is disc space flattening with mild anterior spurring at C5-C6 and C6-C7 level. No prevertebral soft tissue swelling. Cervical airway is patent. IMPRESSION: No acute fracture or subluxation. Degenerative changes as described above. Electronically Signed   By: Lahoma Crocker M.D.   On: 02/04/2015 16:54   Dg Shoulder Right  02/16/2015  CLINICAL DATA:  Abnormal bone scan EXAM: RIGHT SHOULDER - 2+ VIEW COMPARISON:  Bone scan 02/15/2015 and CT scan 02/12/2015 FINDINGS: Three views of the right shoulder submitted. No acute fracture or subluxation. No cortical  breakthrough or cortical reaction. There is a lytic lesion in right scapula just medial to glenoid measures about 1.3 cm. IMPRESSION: No acute fracture or subluxation. No cortical breakthrough or cortical reaction. There is a lytic lesion in right  scapula just medial to glenoid measures about 1.3 cm. Electronically Signed   By: Lahoma Crocker M.D.   On: 02/16/2015 08:13   Ct Head Wo Contrast  02/12/2015  ADDENDUM REPORT: 02/12/2015 20:53 ADDENDUM: Upon further examination, there is a small ovoid soft tissue lesion within the medial aspect of the LEFT orbit just superior to the medial rectus muscle (image number 5, series 2). This small lesion is immediately posterior medial to the globe. Exact origin of this lesion and relationship to the intraconal contents cannot be fully evaluated. Recommend MRI of the orbits with contrast for further characterization. Findings conveyed toMARK QUALE on 02/12/2015  at20:49. Electronically Signed   By: Suzy Bouchard M.D.   On: 02/12/2015 20:53  02/12/2015  CLINICAL DATA:  Antibiotics for pneumonia.  Double vision. EXAM: CT HEAD WITHOUT CONTRAST TECHNIQUE: Contiguous axial images were obtained from the base of the skull through the vertex without intravenous contrast. COMPARISON:  None available FINDINGS: No acute intracranial hemorrhage. No focal mass lesion. No CT evidence of acute infarction. No midline shift or mass effect. No hydrocephalus. Basilar cisterns are patent. Mild generalized atrophy. Paranasal sinuses and  mastoid air cells are clear. 13 mm subcutaneous nodule posterior to the RIGHT year. IMPRESSION: 1. No acute intracranial findings. 2. Probable inflammatory lymph node posterior to the RIGHT year. Electronically Signed: By: Suzy Bouchard M.D. On: 02/12/2015 20:35   Ct Chest Wo Contrast  02/12/2015  CLINICAL DATA:  Pt reports seen here Tuesday, dx with pneumonia; has taken 4 doses of antibiotics and reports no improvement. Pt reports chest congestion and  shortness of breath. Pt reports double vision out of left eye, reports started Wednesday. EXAM: CT CHEST WITHOUT CONTRAST TECHNIQUE: Multidetector CT imaging of the chest was performed following the standard protocol without IV contrast. COMPARISON:  Chest radiograph 02/12/2015 FINDINGS: Evaluation of vascular structures and mediastinal structures is limited without IV contrast material. The heart size is normal. There is mild focal pericardial thickening. Calcification of the aorta. Mild ectasia of the ascending thoracic aorta with AP diameter 3.7 cm. Prominent lymph nodes in the mediastinum. Largest lymph node is a a right paratracheal node measuring 2.1 cm in called extrinsic compression on the trachea. Additional mildly enlarged lymph nodes demonstrated throughout the upper mediastinum, pretracheal region, and in the axilla bilaterally. Appearance is nonspecific but could metastatic disease or lymphoma. Esophagus is decompressed. Patchy sub cm ground-glass nodules demonstrated in the right lung base. More solid nodule demonstrated in the left lung base measuring 5 mm. Focal area of irregular scarring in the right middle lung measuring 10 mm diameter. Early bronchogenic carcinoma is not excluded. Scattered emphysematous changes in the lungs. Heterogeneous areas of lucency and sclerosis throughout the thoracic vertebrae with involvement of vertebral bodies and posterior elements. Focal lesion also demonstrated in the sternum. Multiple expansile right rib lesions. Appearance is consistent with diffuse bone metastasis or possibly myeloma. No focal consolidation.  No pleural effusions.  No pneumothorax. Visualized portions of the upper abdomen demonstrate bilateral adrenal gland nodules, measuring 2.3 cm on the left and 1.9 cm on the right. These are worrisome for metastatic lesions. Nodules in the subcutaneous fat posterior to the right shoulder and anterior to the left lower chest could represent sebaceous cysts or  subcutaneous metastases. IMPRESSION: Mixed lucent and sclerotic and expansile lesions demonstrated in the spine, ribs, and sternum likely to represent metastasis or myeloma. Mediastinal lymphadenopathy suggesting metastasis or lymphoma. Bilateral pulmonary nodules with some solid nodules and some patchy ground-glass  nodules. Focal spiculation in the right middle lung. New to exclude metastasis or primary lung lesion. Bilateral adrenal gland nodules and skin nodules also likely to represent metastases. Electronically Signed   By: Lucienne Capers M.D.   On: 02/12/2015 19:05   Nm Bone Scan Whole Body  02/15/2015  CLINICAL DATA:  Colonic lesion and multiple bone lesions. EXAM: NUCLEAR MEDICINE WHOLE BODY BONE SCAN TECHNIQUE: Whole body anterior and posterior images were obtained approximately 3 hours after intravenous injection of radiopharmaceutical. RADIOPHARMACEUTICALS:  23.09 mCi Technetium-4mMDP IV COMPARISON:  Chest CT 02/12/2015 and abdominal CT 02/14/2015 FINDINGS: Innumerable abnormal foci throughout the calvarium. Numerous abnormal foci involving bilateral ribs and multiple vertebral bodies. The most prominent vertebral body uptake is at L2 and T11. Subtle abnormal foci in the femurs bilaterally. Markedly increased uptake involving the proximal left humerus. Cannot exclude abnormal uptake in the right shoulder region. Multiple abnormal foci in the pelvis. Abnormal uptake involving the lateral left clavicle. IMPRESSION: Abnormal uptake throughout the calvarium, ribs, vertebral bodies, pelvis, bilateral femurs and shoulders. 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 Electronically Signed   By: AMarkus DaftM.D.   On: 02/15/2015 15:53   [place]oreaTransvaginal Non-ob  02/16/2015  CLINICAL DATA:  Cyst seen on ovary. EXAM: TRANSABDOMINAL AND TRANSVAGINAL ULTRASOUND OF PELVIS TECHNIQUE: Both transabdominal and transvaginal ultrasound examinations of the pelvis were performed. Transabdominal technique  was performed for global imaging of the pelvis including uterus, ovaries, adnexal regions, and pelvic cul-de-sac. It was necessary to proceed with endovaginal exam following the transabdominal exam to visualize the endometrium and ovaries. COMPARISON:  CT scan of February 14, 2015. FINDINGS: Uterus Measurements: 6.9 x 4.0 x 2.8 cm. No fibroids or other mass visualized. Endometrium Thickness: 12.3 mm which is abnormally thickened for postmenopausal patient. 4 mm cystic areas seen in endometrium. Right ovary Measurements: 4.5 x 3.3 x 3.1 cm. Internal flow is seen on Doppler, and this is enlarged in size for postmenopausal patient. Left ovary Measurements: 5.0 x 3.1 x 2.9 cm. Internal flow is seen on Doppler, and this is enlarged in size for postmenopausal patient. Other findings No free fluid. IMPRESSION: Endometrial thickness (12 mm) is considered abnormal for an asymptomatic post-menopausal female. Endometrial sampling should be considered to exclude carcinoma. Ovaries appear enlarged and somewhat lobular in appearance, which is unusual for postmenopausal patient. Given the presence of probable metastatic lesions elsewhere in the body described on prior CT scan, the possibility of bilateral Krukenberg tumors in the ovaries cannot be excluded. MRI may be performed for further evaluation. Electronically Signed   By: JMarijo Conception M.D.   On: 02/16/2015 11:36   [place]oreaPelvis Complete  02/16/2015  CLINICAL DATA:  Cyst seen on ovary. EXAM: TRANSABDOMINAL AND TRANSVAGINAL ULTRASOUND OF PELVIS TECHNIQUE: Both transabdominal and transvaginal ultrasound examinations of the pelvis were performed. Transabdominal technique was performed for global imaging of the pelvis including uterus, ovaries, adnexal regions, and pelvic cul-de-sac. It was necessary to proceed with endovaginal exam following the transabdominal exam to visualize the endometrium and ovaries. COMPARISON:  CT scan of February 14, 2015. FINDINGS: Uterus  Measurements: 6.9 x 4.0 x 2.8 cm. No fibroids or other mass visualized. Endometrium Thickness: 12.3 mm which is abnormally thickened for postmenopausal patient. 4 mm cystic areas seen in endometrium. Right ovary Measurements: 4.5 x 3.3 x 3.1 cm. Internal flow is seen on Doppler, and this is enlarged in size for postmenopausal patient. Left ovary Measurements: 5.0 x 3.1 x 2.9 cm. Internal flow is seen  on Doppler, and this is enlarged in size for postmenopausal patient. Other findings No free fluid. IMPRESSION: Endometrial thickness (12 mm) is considered abnormal for an asymptomatic post-menopausal female. Endometrial sampling should be considered to exclude carcinoma. Ovaries appear enlarged and somewhat lobular in appearance, which is unusual for postmenopausal patient. Given the presence of probable metastatic lesions elsewhere in the body described on prior CT scan, the possibility of bilateral Krukenberg tumors in the ovaries cannot be excluded. MRI may be performed for further evaluation. Electronically Signed   By: Marijo Conception, M.D.   On: 02/16/2015 11:36   Ct Abdomen Pelvis W Contrast  02/14/2015  CLINICAL DATA:  64 year old female with recently discrete overt bone lesions concerning for potential multiple myeloma versus metastatic disease. Mediastinal adenopathy, concerning for potential lymphoma versus metastatic disease. Further evaluation to search for potential primary malignancy. EXAM: CT ABDOMEN AND PELVIS WITH CONTRAST TECHNIQUE: Multidetector CT imaging of the abdomen and pelvis was performed using the standard protocol following bolus administration of intravenous contrast. CONTRAST:  168m OMNIPAQUE IOHEXOL 300 MG/ML  SOLN COMPARISON:  Chest CT 02/12/2015. CT of the abdomen and pelvis 04/27/2009 not available for comparison (could not be retrieved from [employer]. CT the abdomen and pelvis 12/04/2006 is available for comparison. FINDINGS: Lower chest: Previously noted 8 x 5 mm nodule in  the periphery of the left lower lobe (image 7 of series 4) is unchanged compared to the recent prior chest CT. Hepatobiliary: 1.6 x 1.2 cm hypovascular lesion in segment 5 of the liver (image 33 of series 2) adjacent to the gallbladder fossa is indeterminate. No intra or extrahepatic biliary ductal dilatation. Gallbladder is normal in appearance. Pancreas: No pancreatic mass. No pancreatic ductal dilatation. No pancreatic or peripancreatic fluid or inflammatory changes. Spleen: Sub cm low-attenuation lesion in the superior aspect of the spleen is too small to definitively characterize, but is favored to represent a small cyst. Adrenals/Urinary Tract: Bilateral adrenal nodules are noted, measuring 1.4 x 2.2 cm on the right and 2.0 x 2.5 cm on the left, highly concerning for metastatic lesions. Sub cm low-attenuation lesions in both kidneys are too small to definitively characterize, but favored to represent cysts. Exophytic 8 mm high attenuation lesion (63 HU on portal venous phase imaging and 64 HU on delayed imaging) is incompletely characterized, but favored to represent a proteinaceous/hemorrhagic cyst. No hydroureteronephrosis. Urinary bladder is normal in appearance. Stomach/Bowel: The appearance of the stomach is normal. No pathologic dilatation of small bowel or colon. In the medial aspect of the cecum there is mass-like soft tissue thickening, best appreciated on axial image 49 of series 2 and coronal image 61 of series 5, measuring approximately 3.7 x 3.2 x 3.7 cm. There is haziness in the surrounding pericolonic fat, and adjacent lymphadenopathy which appears contiguous with this apparent colonic mass. The largest lymph node is in the ileocolic mesentery on axial image 44 of series 2 and coronal image 58 of series 5, measuring 2.4 x 2.9 x 3.8 cm. Normal appendix. Vascular/Lymphatic: Atherosclerosis throughout the abdominal and pelvic vasculature, without evidence of aneurysm or dissection. As discussed  above there is significant ileocolic lymphadenopathy. Multiple prominent borderline enlarged retroperitoneal lymph nodes are noted, measuring up to 7 mm in short axis immediately anterior to the lower pole of the right kidney. Mildly enlarged lymph node adjacent to the superior mesenteric vein (image 36 of series 2) measuring 1 cm in short axis. Multiple other borderline enlarged mesenteric lymph nodes are also noted, which are conspicuous  but nonspecific. Reproductive: Uterus is unremarkable in appearance. Multiple well-defined low to intermediate attenuation lesions in the ovaries bilaterally (right greater than left), measuring up to 2.4 cm in diameter in the inferior aspect of the right ovary, unusual in a postmenopausal patient. Coarse calcification in the left ovary anteriorly. Other: Trace volume of ascites in the low anatomic pelvis. No pneumoperitoneum. Musculoskeletal: Numerous mixed lytic and sclerotic lesions are noted throughout the visualized axial and appendicular skeleton, concerning for widespread metastatic disease to the bones. The largest of these involves the superior endplate of L4 on the right side measuring 3.0 x 1.8 cm (image 39 of series 2) where there is a pathologic compression with approximately 25% loss of height focally. IMPRESSION: 1. 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 2. In addition, there are multiple low to intermediate attenuation cystic appearing lesions in the ovaries bilaterally. This appearance is unusual in a postmenopausal female. Further evaluation with nonemergent pelvic ultrasound  is suggested in the near future sure further characterization. This recommendation follows ACR consensus guidelines: White Paper of the ACR Incidental Findings Committee II on Adnexal Findings. J Am Coll Radiol 2013:10:675-681. 3. Extensive atherosclerosis. Electronically Signed   By: Vinnie Langton M.D.   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 Additional Medications: None. ANESTHESIA/SEDATION: Moderate sedation time: 15 minutes PROCEDURE: The procedure, risks, benefits, and alternatives were explained to the patient. Questions regarding the procedure were encouraged and answered. The patient understands and consents to the procedure. The right posterior iliac area was prepped with chlorhexidine in a sterile fashion, and a sterile drape was applied covering the operative field. A sterile gown and sterile gloves were used for the procedure. Under CT guidance, a(n) 11 gauge guide needle was advanced into the right iliac crest sclerotic bone lesion. Needle position confirmed with CT. 11 gauge drill core biopsy obtained. Sample placed on a saline soaked Telfa. Postprocedure imaging demonstrates core biopsy sample defect through the sclerotic lesion. No complication. Patient tolerated the procedure well without complication. Vital sign monitoring by nursing staff during the procedure will continue as patient is in the special procedures unit for post procedure observation. FINDINGS: The images document guide needle placement within the right iliac crest sclerotic bone lesion. Post biopsy images demonstrate no hemorrhage or hematoma. COMPLICATIONS: None immediate IMPRESSION: Successful CT-guided core biopsy of the right iliac crest sclerotic bone lesion. Electronically Signed   By: Jerilynn Mages.  Shick M.D.   On: 02/19/2015  10:47   Dg Shoulder Left  02/16/2015  CLINICAL DATA:  Abnormal bone scan. EXAM: LEFT SHOULDER - 2+ VIEW COMPARISON:  Bone scan 02/15/2015. FINDINGS: Rounded lucency in the proximal humerus in the region of bone scan abnormality is present consistent the lytic lesion. Questionable small lytic lesion in the acromion process of the left scapula. Given recent bone scan findings, findings consistent with metastatic disease. IMPRESSION: 1. Prominent lytic lesion in the proximal humerus.  No fracture. 2. Questionable tiny lytic lesion in the left acromion . Electronically Signed   By: Marcello Moores  Register   On: 02/16/2015 08:12   Dg Femur Min 2 Views Left  02/16/2015  CLINICAL DATA:  Abnormal radionuclide scan.  Rule out bone lesion. EXAM: LEFT FEMUR 2 VIEWS COMPARISON:  Bone scan 02/15/2015 FINDINGS: Negative for fracture. No lytic or sclerotic bone lesion. Bone scan reveals mild diffuse uptake in a patchy configuration throughout the left femur. No definite radiographic correlation. Left hip joint normal. IMPRESSION: Abnormal bone scan in the left femur does not show lytic or sclerotic bone lesion or fracture. Findings remain concerning for metastatic disease or myeloma. Electronically Signed   By: Franchot Gallo M.D.   On: 02/16/2015 08:12   Dg Femur, Min 2 Views Right  02/16/2015  CLINICAL DATA:  Colonic lesion. Abnormal bone scan with multiple osseous lesions. EXAM: RIGHT FEMUR 2 VIEWS COMPARISON:  Bone scan 02/15/2015.  Pelvic CT 02/14/2015. FINDINGS: The mineralization and alignment are normal. There is no evidence of acute fracture or dislocation. No focal lytic or blastic lesions are identified within the right femur. Subtle blastic lesions in the right hemipelvis are better seen on recent CT. There are no significant arthropathic changes. IMPRESSION: No radiographic evidence of metastatic disease within the right femur or pathologic fracture. Given the predominately blastic nature of the osseous disease  and adenopathy on prior CT, consider lymphoma. Electronically Signed   By: Richardean Sale M.D.   On: 02/16/2015 08:14   Mr Darnelle Catalan Wo/w Cm  02/15/2015  CLINICAL DATA:  Multiple myeloma. Soft tissue lesion in the left orbit. Abnormal CT scan. EXAM: MRI OF THE ORBITS WITHOUT AND WITH CONTRAST TECHNIQUE: Multiplanar, multisequence MR imaging of the orbits was performed both before and after the administration of intravenous contrast. CONTRAST:  34m MULTIHANCE GADOBENATE DIMEGLUMINE 529 MG/ML IV SOLN COMPARISON:  With with right severe duodenum MRI for this lesion retrospect upon CT which could items curious the cut were this is would is but FINDINGS: The lesion on the CT scan is centered within the left superior oblique muscle belly. A focal area of enhancement measures 7.0 x 10.5 x 9.5 mm. Multiple focal areas of enhancement are present within the brain. Lesion along the inferior left paramedian vermis measures 5.4 mm on image 5 of series 15. Punctate lateral cerebellar lesions are present bilaterally. A right paramedian superior vermis lesion is evident on image 5 of series 13. A punctate lesion is present in the left thalamus. There is a punctate lesion near the left caudate head. A lesion at the left globus pallidus measures 3 mm. A 5 mm anterior right frontal lobe lesion is present. There is thickening and enhancement of the pituitary infundibulum. The optic chiasm is within normal limits. The globes are within normal limits. No other focal lesions are present. The superior ophthalmic veins are within normal limits bilaterally. The optic nerves are unremarkable. The rectus musculature is otherwise within normal limits. Multiple calvarial lesions are present with heterogeneous enhancement throughout the calvarium. IMPRESSION: 1. 7.0 x 10.5 x 9.5 mm lesion within the belly of the left superior oblique muscle is most compatible with a focal metastasis given multiple other brain lesions. 2. Multiple focal areas  enhancement involving the cerebellum bilaterally, the left basal ganglia and thalamus, and the anteromedial right frontal lobe. These are also concerning for metastatic disease. 3. Thickening and enhancement of the pituitary infundibulum. This can be seen with lymphoma but also with other metastatic disease. 4. The globes and orbits are otherwise unremarkable. 5. Diffuse osseous lesions. This may be related to metastatic disease  or the patient's known multiple myeloma. Electronically Signed   By: San Morelle M.D.   On: 02/15/2015 14:48     CBC  Recent Labs Lab 02/25/15 1612  WBC 7.0  HGB 12.5  HCT 37.8  PLT 162  MCV 81.4  MCH 27.0  MCHC 33.2  RDW 17.3*  LYMPHSABS 0.9*  MONOABS 0.4  EOSABS 0.1  BASOSABS 0.0    Chemistries   Recent Labs Lab 02/25/15 1612  NA 143  K 3.4*  CL 103  CO2 32  GLUCOSE 103*  BUN 7  CREATININE 0.80  CALCIUM 9.7  AST 33  ALT 41  ALKPHOS 173*  BILITOT 0.8   ------------------------------------------------------------------------------------------------------------------ estimated creatinine clearance is 61.3 mL/min (by C-G formula based on Cr of 0.8). ------------------------------------------------------------------------------------------------------------------ No results for input(s): HGBA1C in the last 72 hours. ------------------------------------------------------------------------------------------------------------------ No results for input(s): CHOL, HDL, LDLCALC, TRIG, CHOLHDL, LDLDIRECT in the last 72 hours. ------------------------------------------------------------------------------------------------------------------ No results for input(s): TSH, T4TOTAL, T3FREE, THYROIDAB in the last 72 hours.  Invalid input(s): FREET3 ------------------------------------------------------------------------------------------------------------------ No results for input(s): VITAMINB12, FOLATE, FERRITIN, TIBC, IRON, RETICCTPCT in the last  72 hours.  Coagulation profile  Recent Labs Lab 02/25/15 1612  INR 1.26    No results for input(s): DDIMER in the last 72 hours.  Cardiac Enzymes No results for input(s): CKMB, TROPONINI, MYOGLOBIN in the last 168 hours.  Invalid input(s): CK ------------------------------------------------------------------------------------------------------------------ Invalid input(s): [place]   1.Intractable pain - patient with metastatic disease due to lung cancer. Also has concurrent diagnosis of colon cancer. At this point I'll increase her Duragesic to 75 g every 72 hours start her on some OxyContin continue IV pain medications can adjust her medications based on her response. 2. Metastasis ([place]) - widespread metastases with bony metastases. Oncology consult 3.  Dehydration - 2 new IV hydration 4.  Small cell lung cancer [facility]) - oncology consult as above. Oncology consult regarding further treatment    Code Status Orders        Start     Ordered   02/25/15 1935  Do not attempt resuscitation (DNR)   Continuous    Question Answer Comment  In the event of cardiac or respiratory ARREST Do not call a "code blue"   In the event of cardiac or respiratory ARREST Do not perform Intubation, CPR, defibrillation or ACLS   In the event of cardiac or respiratory ARREST Use medication by any route, position, wound care, and other measures to relive pain and suffering. May use oxygen, suction and manual treatment of airway obstruction as needed for comfort.   Comments Discussed with the patient and her husband      02/25/15 1935           Consults 59mn  DVT Prophylaxis  Heparin   Lab Results  Component Value Date   PLT 162 02/25/2015     Time Spent in minutes   410m  PADustin Flock.D on 02/26/2015 at 12:16 PM  Between 7am to 6pm - Pager - [phone]  After 6pm go to [email or web address] - password [email or web address]   Office   [phone]

## 2015-02-26 NOTE — Plan of Care (Signed)
Problem: Safety: Goal: Ability to remain free from injury will improve Outcome: Progressing Patient is experiencing weakness when ambulating. Up to bathroom with one person assistance.

## 2015-02-27 DIAGNOSIS — F17210 Nicotine dependence, cigarettes, uncomplicated: Secondary | ICD-10-CM

## 2015-02-27 DIAGNOSIS — C7931 Secondary malignant neoplasm of brain: Secondary | ICD-10-CM

## 2015-02-27 DIAGNOSIS — C787 Secondary malignant neoplasm of liver and intrahepatic bile duct: Secondary | ICD-10-CM

## 2015-02-27 DIAGNOSIS — C189 Malignant neoplasm of colon, unspecified: Secondary | ICD-10-CM

## 2015-02-27 DIAGNOSIS — C7951 Secondary malignant neoplasm of bone: Secondary | ICD-10-CM

## 2015-02-27 DIAGNOSIS — G893 Neoplasm related pain (acute) (chronic): Principal | ICD-10-CM

## 2015-02-27 LAB — BASIC METABOLIC PANEL
ANION GAP: 8 (ref 5–15)
BUN: 6 mg/dL (ref 6–20)
CALCIUM: 9.6 mg/dL (ref 8.9–10.3)
CO2: 28 mmol/L (ref 22–32)
Chloride: 112 mmol/L — ABNORMAL HIGH (ref 101–111)
Creatinine, Ser: 0.71 mg/dL (ref 0.44–1.00)
Glucose, Bld: 103 mg/dL — ABNORMAL HIGH (ref 65–99)
Potassium: 3.6 mmol/L (ref 3.5–5.1)
SODIUM: 148 mmol/L — AB (ref 135–145)

## 2015-02-27 LAB — GLUCOSE, CAPILLARY
Glucose-Capillary: 88 mg/dL (ref 65–99)
Glucose-Capillary: 105 mg/dL — ABNORMAL HIGH (ref 65–99)
Glucose-Capillary: 102 mg/dL — ABNORMAL HIGH (ref 65–99)

## 2015-02-27 MED ORDER — SENNA 8.6 MG PO TABS
2.0000 | ORAL_TABLET | Freq: Every day | ORAL | Status: DC
  Administered 2015-02-28 – 2015-03-06 (×7): 17.2 mg via ORAL
  Filled 2015-02-27 (×7): qty 2

## 2015-02-27 MED ORDER — HYDROMORPHONE HCL 1 MG/ML IJ SOLN
1.0000 mg | INTRAMUSCULAR | Status: DC | PRN
  Administered 2015-02-27 – 2015-03-02 (×11): 1 mg via INTRAVENOUS
  Filled 2015-02-27 (×11): qty 1

## 2015-02-27 MED ORDER — OXYCODONE HCL 5 MG PO TABS
5.0000 mg | ORAL_TABLET | ORAL | Status: DC | PRN
  Administered 2015-02-27 – 2015-02-28 (×2): 5 mg via ORAL
  Filled 2015-02-27 (×2): qty 1

## 2015-02-27 NOTE — Progress Notes (Signed)
[facility] [place] NAME: Brandy Odom    MR#:  [id number]  DATE OF BIRTH:  December 27, 1950  SUBJECTIVE: 64 year old female patient admitted for intractable bone pains and poor by mouth intake. Recently diagnosed metastatic lung cancer. Patient has nausea constipation diplopia diffuse bone pains.   CHIEF COMPLAINT:  No chief complaint on file.   REVIEW OF SYSTEMS:   ROS CONSTITUTIONAL: No fever, fatigue or weakness.  EYES: Double vision. EARS, NOSE, AND THROAT: No tinnitus or ear pain.  RESPIRATORY: No cough, shortness of breath, wheezing or hemoptysis.  CARDIOVASCULAR: No chest pain, orthopnea, edema.  GASTROINTESTINAL: No nausea, vomiting, diarrhea or abdominal pain.  GENITOURINARY: No dysuria, hematuria.  ENDOCRINE: No polyuria, nocturia,  HEMATOLOGY: No anemia, easy bruising or bleeding SKIN: No rash or lesion. MUSCULOSKELETAL: Back pain issues.  NEUROLOGIC: No tingling, numbness, weakness.  PSYCHIATRY: Anxiety and panic attacks. Wants to talk to her son who lives in [place].  DRUG ALLERGIES:   Allergies  Allergen Reactions  . Bee Venom Anaphylaxis  . Naproxen Nausea And Vomiting  . Sulfa Antibiotics Other (See Comments)    Reaction:  Unknown   . Antihistamines, Chlorpheniramine-Type Palpitations    VITALS:  Blood pressure 120/72, pulse 92, temperature 98.1 F (36.7 C), temperature source Oral, resp. rate 19, height '5\' 4"'$  (1.626 m), weight 63.504 kg (140 lb), SpO2 97 %.  PHYSICAL EXAMINATION:  GENERAL:  64 y.o.-year-old patient lying in the bed with no acute distress.  EYES: Pupils equal, round, reactive to light and accommodation. No scleral icterus. Extraocular muscles intact.  HEENT: Head atraumatic, normocephalic. Oropharynx and nasopharynx clear.  NECK:  Supple, no jugular venous distention. No thyroid enlargement, no tenderness.  LUNGS: Normal breath sounds bilaterally, no wheezing, rales,rhonchi or  crepitation. No use of accessory muscles of respiration.  CARDIOVASCULAR: S1, S2 normal. No murmurs, rubs, or gallops.  ABDOMEN: Soft, nontender, nondistended. Bowel sounds present. No organomegaly or mass.  EXTREMITIES: No pedal edema, cyanosis, or clubbing.  NEUROLOGIC: Cranial nerves II through XII are intact. Muscle strength 5/5 in all extremities. Sensation intact. Gait not checked.  PSYCHIATRIC: The patient is alert and oriented x 3.  SKIN: No obvious rash, lesion, or ulcer.    LABORATORY PANEL:   CBC  Recent Labs Lab 02/25/15 1612  WBC 7.0  HGB 12.5  HCT 37.8  PLT 162   ------------------------------------------------------------------------------------------------------------------  Chemistries   Recent Labs Lab 02/25/15 1612 02/27/15 0548  NA 143 148*  K 3.4* 3.6  CL 103 112*  CO2 32 28  GLUCOSE 103* 103*  BUN 7 6  CREATININE 0.80 0.71  CALCIUM 9.7 9.6  AST 33  --   ALT 41  --   ALKPHOS 173*  --   BILITOT 0.8  --    ------------------------------------------------------------------------------------------------------------------  Cardiac Enzymes No results for input(s): TROPONINI in the last 168 hours. ------------------------------------------------------------------------------------------------------------------  RADIOLOGY:  No results found.  EKG:   Orders placed or performed during the hospital encounter of 02/12/15  . ED EKG  . ED EKG    ASSESSMENT AND PLAN:   Intractable back pain and whole body pain secondary to metastatic small cell lung cancer: Continue fentanyl patch, IV Dilaudid, OxyContin has been added on admission for smoker pain control. #2 surgery contacted for Port-A-Cath placement, radiation oncology for starting the cranial radiation #3 Megace for appetite stimulation #4 dysphagia consult speech therapy for possible MBS evaluation. 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 #6. anxiety and  depression due to new diagnosis of metastatic lung cancer: Started on Seroquel 25 mg at night.   PROGNOSIS is poor CODE STATUS DO NOT RESUSCITATE  All the records are reviewed and case discussed with Care Management/Social Workerr. Management plans discussed with the patient, family and they are in agreement.  CODE STATUS: DO NOT RESUSCITATE  TOTAL TIME TAKING CARE OF THIS PATIENT:36 minutes.   POSSIBLE D/C IN 5-7DAYS, DEPENDING ON CLINICAL CONDITION.   Epifanio Lesches M.D on 02/27/2015 at 12:51 PM  Between 7am to 6pm - Pager - [phone]  After 6pm go to [email or web address] - password [email or web address] [facility]  [place] [facility]  Office  [phone]  CC: Primary care physician; No PCP Per Patient   Note: This dictation was prepared with Dragon dictation along with smaller phrase technology. Any transcriptional errors that result from this process are unintentional.

## 2015-02-27 NOTE — Plan of Care (Signed)
Problem: Safety: Goal: Ability to remain free from injury will improve Outcome: Progressing Patient with continuing weakness to lower extremities.  Patient is considered a high fall risk.  She is a 1 person assist to [facility].  Bed is in lowest position with wheels locked and call bell and phone within reach.  Patient without injury this shift.  Problem: Pain Managment: Goal: General experience of comfort will improve Outcome: Not Progressing Patient continues with unresolved pain.  Patient taking scheduled Oxycontin with Norco and Dilaudid PRN.  Patient rating pain radiating across upper chest a 9-10/10.  Patient states that pain is not resolving with medication or repositioning.  Patient sleeping comfortably upon reassessment by RN.

## 2015-02-27 NOTE — Progress Notes (Signed)
[facility]  Date of admission:  02/25/2015  Inpatient day:  02/27/2015   Consulting physician:  Dr. Vianne Bulls  Chief Complaint: Brandy Odom is a 64 y.o. female with metastatic small cell lung cancer who was admitted with intractable diffuse bone pain and poor oral intake.  Subjective: Significant improvement in pain control. Claims that she is more generally uncomfortable rather than in pain.  Past Medical History  Diagnosis Date  . Tobacco abuse   . Muscle spasm   . Small cell lung cancer [facility])     Metastatic    Past Surgical History  Procedure Laterality Date  . Appendectomy    . Colonoscopy with propofol N/A 02/15/2015    Procedure: COLONOSCOPY WITH PROPOFOL;  Surgeon: Lucilla Lame, MD;  Location: [facility] ENDOSCOPY;  Service: Endoscopy;  Laterality: N/A;    Family History  Problem Relation Age of Onset  . Colon cancer      Social History:  reports that she has been smoking Cigarettes.  She does not have any smokeless tobacco history on file. She reports that she does not drink alcohol or use illicit drugs.  She lives in [place] with her husband, [name].  She is alone today.  Allergies:  Allergies  Allergen Reactions  . Bee Venom Anaphylaxis  . Naproxen Nausea And Vomiting  . Sulfa Antibiotics Other (See Comments)    Reaction:  Unknown   . Antihistamines, Chlorpheniramine-Type Palpitations    Medications Prior to Admission  Medication Sig Dispense Refill  . diazepam (VALIUM) 5 MG tablet Take 1 tablet (5 mg total) by mouth every 8 (eight) hours as needed for muscle spasms. 20 tablet 0  . docusate sodium (COLACE) 100 MG capsule Take 1 capsule (100 mg total) by mouth 2 (two) times daily. 10 capsule 0  . fentaNYL (DURAGESIC - DOSED MCG/HR) 25 MCG/HR patch Place 1 patch (25 mcg total) onto the skin every 3 (three) days. 5 patch 0  . HYDROcodone-acetaminophen (NORCO) 5-325 MG tablet Take 1 tablet by mouth every 4 (four) hours as needed for moderate pain.  20 tablet 0  . nicotine (NICODERM CQ - DOSED IN MG/24 HOURS) 21 mg/24hr patch Place 1 patch (21 mg total) onto the skin daily. 28 patch 0  . nystatin (MYCOSTATIN) 100000 UNIT/ML suspension Take 5 mLs (500,000 Units total) by mouth 4 (four) times daily. 60 mL 0    Review of Systems: GENERAL:  Fatigue.  No fever or sweats.  Weight loss. PERFORMANCE STATUS (ECOG):  4 HEENT:  Diplopia on right lateral gaze.  No runny nose, sore throat, mouth sores or tenderness. Lungs:  Chronic shortness of breath.  Rare cough.  No hemoptysis. Cardiac:  Chest/rib pain (worse with any coughing).  No palpitations, orthopnea, or PND. GI:  Poor appetite.  Constipation.  No nausea, vomiting, diarrhea,melena or hematochezia. GU:  No urgency, frequency, dysuria, or hematuria. Musculoskeletal: Diffuse bone pain (ribs, back, legs).  No muscle tenderness. Extremities:  No swelling. Skin:  No rashes or skin changes. Neuro:  General weakness.  No headache, numbness or weakness, balance or coordination issues. Endocrine:  No diabetes, thyroid issues, hot flashes or night sweats. Psych:  Anxiety.  Depression. Pain:  Bone pain, diffuse (worse on left side). Review of systems:  All other systems reviewed and found to be negative.  Physical Exam:  Blood pressure 114/62, pulse 83, temperature 98 F (36.7 C), temperature source Oral, resp. rate 18, height 5' 4"  (1.626 m), weight 140 lb (63.504 kg), SpO2 98 %.  GENERAL:  Chronically fatigued appearing woman lying comfortably on the medical unit in no acute distress. MENTAL STATUS:  Alert and oriented to person, place and time. HEAD:  Red shoulder length hair with dark roots.  Normocephalic, atraumatic, face symmetric, no Cushingoid features. EYES:  Brown eyes.  No conjunctivitis or scleral icterus. ENT: Oropharynx clear without lesion. Tongue normal. Mucous membranes moist.  RESPIRATORY: Scattered rhonchi. No wheezes. CARDIOVASCULAR: Regular rate and rhythm without  murmur, rub or gallop. ABDOMEN: Soft, non-tender, with active bowel sounds, and no appreciable hepatosplenomegaly. No masses. SKIN: No rashes or ulcers. EXTREMITIES: No edema, no skin discoloration or tenderness. No palpable cords. LYMPH NODES: No palpable cervical, supraclavicular, axillary or inguinal adenopathy  NEUROLOGICAL:  Stable. PSYCH: Appropriate.  Results for orders placed or performed during the hospital encounter of 02/25/15 (from the past 48 hour(s))  Basic metabolic panel     Status: Abnormal   Collection Time: 02/27/15  5:48 AM  Result Value Ref Range   Sodium 148 (H) 135 - 145 mmol/L   Potassium 3.6 3.5 - 5.1 mmol/L   Chloride 112 (H) 101 - 111 mmol/L   CO2 28 22 - 32 mmol/L   Glucose, Bld 103 (H) 65 - 99 mg/dL   BUN 6 6 - 20 mg/dL   Creatinine, Ser 0.71 0.44 - 1.00 mg/dL   Calcium 9.6 8.9 - 10.3 mg/dL   GFR calc non Af Amer >60 >60 mL/min   GFR calc Af Amer >60 >60 mL/min    Comment: (NOTE) The eGFR has been calculated using the CKD EPI equation. This calculation has not been validated in all clinical situations. eGFR's persistently <60 mL/min signify possible Chronic Kidney Disease.    Anion gap 8 5 - 15  Glucose, capillary     Status: None   Collection Time: 02/27/15  7:20 AM  Result Value Ref Range   Glucose-Capillary 88 65 - 99 mg/dL   No results found.  Assessment:  The patient is a 64 y.o. woman with metastatic small cell lung cancer. She has a greater than 50 pack year smoking history. She presented with a 28 pound weight loss over 2 months, nausea, constipation, diplopia on lateral gaze, and diffuse bone pain. Imaging studies reveal diffuse bone disease, mediastinal adenopathy, bilateral pulmonary nodules, bilateral adrenal metastasis, a suspicious liver lesion, and multiple brain metastasis.  CT guided right iliac crest biopsy on 02/19/2015 confirmed metastatic small cell carcinoma of the lung. CEA was 333.8 on 02/14/2015.  Colonoscopy on  02/15/2015 revealed a frond-like/villous partially obstructing non-circumferential mass in the cecum. 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 CEA was 333.8 on 02/14/2015.  Serologies revealed prior hepatitis B infection.  Her clinical status has improved significantly with increase in fentanyl dosage. Her appetite is better, she has been able to eat better today, but complains of feeling of food being stuck in her throat  Plan:   1.  Palliative care consult has been done. I also discussed potential treatment options versus hospice care. Pain control has been significantly better over the past 24 hours, bowel movements are regular. We'll continue with the current regimen. 2.  Nutrition discussed. Patient complains of occasional feeling of food being stuck in her throat. Advised about trying softer food. If she continues to complain of dysphagia, speech and swallow consult should be called. 3.  Surgery contacted regarding port-a-cath placement. 4.  Radiation oncology contacted regarding initiation of cranial radiation. 5.  Social work to assist with medicare coverage.  Case was discussed  with Dr. Beau Fanny, MD  02/27/2015, 10:42 AM

## 2015-02-27 NOTE — Progress Notes (Addendum)
Palliative Care Update  Pt is familiar to me from most recent admission.  She is DNR status.  I have spoken with Dr. Mike Gip about pt and have talked at length with nursing staff about patient's need for improved symptom management.    Pt says she feels her pain is controlled 'ok' this pm.  She has had Dilaudid IV added as an option.   She is informed that we can adjust meds here and then adjust meds to be stronger than what she was sent home on, which she describes as inadequate.  When I came into the room, she needed repositioning.  I asked for nursing help and we moved her.  Pt was not even able to assist at all with her own position in bed.  She also had pain when we moved her so she was higher up in bed and on her back instead of her right side.    I spoke to her about where she sees herself going after this hospital stay.  Her husband is in the room and it is learned that he works 'intermittently' but cannot be with her 24/7.  She is focused on getting someone to clean the house because she cannot clean the house anymore. She wants to hire someone who can help --but it is doubtful that they have those kinds of resources.  She wants a Education officer, museum to help her get help in the home because she is 'disabled' ---but she is not under any kind of disability status officially.  She seems to think that if the house can be cleaned before she goes home, that she can start over at home and manage better --if she can "get some help from the social worker lining up something".  She is unrealistic in her perceptions of what may be available for her --outside of Hospice.    She is resistant to Hospice in the Home --but I described what it can do for her anyway.  She says it 'just sounds like I would be giving up --so I don't like the idea of it'.  She was told that it might not be time yet for Hospice because she is planning on getting radiation and then chemo --but she might want to choose to get Hospice help  in the home 'when the time comes'.  I briefly mentioned Hospice Home but focused on the benefits that Hospice in the Home could provide --since these align with some of the help the pt is asking for.   She had a misperception that she could get "Palliative Care' help in the home. I am not sure how she go misinformation about this--but she is not the only pt who has brought this up lately.   There is no Palliative Care service that goes to homes in the area as yet ---and even if this began soon (as it may one day) --it would not begin to provide the services pt is expecting:  someone to clean house, an aide to wash her hair and help her bathe etc, nursing care, financial help, etc.   Unfortunately, she currently has a very poor performance status --but if her pain could be controlled by decreased tumor burden via XRT, then perhaps she could move a bit more. Right now, she is bedbound and unable to do anything much at all.  Her husband is upset that she hasn't already started radiation.  Pt and husband had several 'disagreements' while I was in the room.  Nursing staff told me pt has again mentioned NOT getting any treatment at all in conversations with them.  But, she did not converse about this with me.  She did say she 'needed a few days to rest' before any radiation starts.  Also, her talk about not getting treatment is a bit inconsistent with her statements that she doesn't like the idea of Hospice because it would 'mean giving up".   I worry that she could end up resisting the very care that would help her the most.  She is aware that she can ask for changes in her meds this weekend when I am not here, but she tells me her pain is controlled with current orders right now.  I will review her notes over the weekend and see her again Monday, since she is expected to be here then.    Full note to follow.  Colleen Can, MD  Palliative Care

## 2015-02-27 NOTE — Progress Notes (Signed)
Palliative Care Update  Based on review of nursing notes, I have made a few changes to pts prn pain medcations, with hopes to improve her pain control.  I have reviewed this information while away from the hospital, as I had said I would do when visiting pt last evening.  It seems that pt is sleeping much of the time, but when awake, is having uncontrolled pain (though last night she said her pain was controlled).  Colleen Can, MD

## 2015-02-28 LAB — CBC
HCT: 32.9 % — ABNORMAL LOW (ref 35.0–47.0)
Hemoglobin: 11.1 g/dL — ABNORMAL LOW (ref 12.0–16.0)
MCH: 27.9 pg (ref 26.0–34.0)
MCHC: 33.7 g/dL (ref 32.0–36.0)
MCV: 82.6 fL (ref 80.0–100.0)
PLATELETS: 141 10*3/uL — AB (ref 150–440)
RBC: 3.98 MIL/uL (ref 3.80–5.20)
RDW: 17.4 % — AB (ref 11.5–14.5)
WBC: 4.3 10*3/uL (ref 3.6–11.0)

## 2015-02-28 MED ORDER — DIAZEPAM 5 MG PO TABS
10.0000 mg | ORAL_TABLET | Freq: Three times a day (TID) | ORAL | Status: DC | PRN
  Administered 2015-02-28 – 2015-03-05 (×9): 10 mg via ORAL
  Filled 2015-02-28 (×9): qty 2

## 2015-02-28 NOTE — Progress Notes (Signed)
[facility] NAME: Brandy Odom    MR#:  [id number]  DATE OF BIRTH:  Jun 07, 1950  SUBJECTIVE: 64 year old female patient admitted for intractable bone pains and poor by mouth intake. Recently diagnosed metastatic lung cancer.  He complains of a lot of anxiety, wanted to talk to her son and also the mom. Wanted increased dose of Valium. Overall pain is well controlled.   CHIEF COMPLAINT:  No chief complaint on file.   REVIEW OF SYSTEMS:   ROS CONSTITUTIONAL: No fever, fatigue or weakness.  EYES: Double vision. EARS, NOSE, AND THROAT: No tinnitus or ear pain.  RESPIRATORY: No cough, shortness of breath, wheezing or hemoptysis.  CARDIOVASCULAR: No chest pain, orthopnea, edema.  GASTROINTESTINAL: No nausea, vomiting, diarrhea or abdominal pain.  GENITOURINARY: No dysuria, hematuria.  ENDOCRINE: No polyuria, nocturia,  HEMATOLOGY: No anemia, easy bruising or bleeding SKIN: No rash or lesion. MUSCULOSKELETAL: Back pain issues.  NEUROLOGIC: No tingling, numbness, weakness.  PSYCHIATRY: Anxiety and panic attacks. Wants to talk to her son who lives in [place].  DRUG ALLERGIES:   Allergies  Allergen Reactions  . Bee Venom Anaphylaxis  . Naproxen Nausea And Vomiting  . Sulfa Antibiotics Other (See Comments)    Reaction:  Unknown   . Antihistamines, Chlorpheniramine-Type Palpitations    VITALS:  Blood pressure 135/85, pulse 94, temperature 97.5 F (36.4 C), temperature source Oral, resp. rate 18, height '5\' 4"'$  (1.626 m), weight 63.504 kg (140 lb), SpO2 100 %.  PHYSICAL EXAMINATION:  GENERAL:  64 y.o.-year-old patient lying in the bed with no acute distress.  EYES: Pupils equal, round, reactive to light and accommodation. No scleral icterus. Extraocular muscles intact.  HEENT: Head atraumatic, normocephalic. Oropharynx and nasopharynx clear.  NECK:  Supple, no jugular venous distention. No thyroid enlargement, no  tenderness.  LUNGS: Normal breath sounds bilaterally, no wheezing, rales,rhonchi or crepitation. No use of accessory muscles of respiration.  CARDIOVASCULAR: S1, S2 normal. No murmurs, rubs, or gallops.  ABDOMEN: Soft, nontender, nondistended. Bowel sounds present. No organomegaly or mass.  EXTREMITIES: No pedal edema, cyanosis, or clubbing.  NEUROLOGIC: Cranial nerves II through XII are intact. Muscle strength 5/5 in all extremities. Sensation intact. Gait not checked.  PSYCHIATRIC: The patient is alert and oriented x 3.  SKIN: No obvious rash, lesion, or ulcer.    LABORATORY PANEL:   CBC  Recent Labs Lab 02/28/15 0515  WBC 4.3  HGB 11.1*  HCT 32.9*  PLT 141*   ------------------------------------------------------------------------------------------------------------------  Chemistries   Recent Labs Lab 02/25/15 1612 02/27/15 0548  NA 143 148*  K 3.4* 3.6  CL 103 112*  CO2 32 28  GLUCOSE 103* 103*  BUN 7 6  CREATININE 0.80 0.71  CALCIUM 9.7 9.6  AST 33  --   ALT 41  --   ALKPHOS 173*  --   BILITOT 0.8  --    ------------------------------------------------------------------------------------------------------------------  Cardiac Enzymes No results for input(s): TROPONINI in the last 168 hours. ------------------------------------------------------------------------------------------------------------------  RADIOLOGY:  No results found.  EKG:   Orders placed or performed during the hospital encounter of 02/12/15  . ED EKG  . ED EKG    ASSESSMENT AND PLAN:   Intractable back pain and whole body pain secondary to metastatic small cell lung cancer: Continue fentanyl patch, IV Dilaudid, OxyContin has been added on admission .  #2 surgery contacted for Port-A-Cath placement, radiation oncology for starting the cranial radiation #3 Megace for appetite stimulation #4 dysphagia consultED speech  therapy for possible MBS evaluation. 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 #6. anxiety and depression due to new diagnosis of metastatic lung cancer: Started on Seroquel 25 mg at night. 7.anxiety increase the Valium dose.     PROGNOSIS is poor CODE STATUS DO NOT RESUSCITATE  All the records are reviewed and case discussed with Care Management/Social Workerr. Management plans discussed with the patient, family and they are in agreement.  CODE STATUS: DO NOT RESUSCITATE  TOTAL TIME TAKING CARE OF THIS PATIENT:36 minutes.   POSSIBLE D/C IN 5-7DAYS, DEPENDING ON CLINICAL CONDITION.   Epifanio Lesches M.D on 02/28/2015 at 12:41 PM  Between 7am to 6pm - Pager - [phone]  After 6pm go to [email or web address] - password [email or web address] [facility]  [facility]  Office  [phone]  CC: Primary care physician; No PCP Per Patient   Note: This dictation was prepared with Dragon dictation along with smaller phrase technology. Any transcriptional errors that result from this process are unintentional.

## 2015-02-28 NOTE — Progress Notes (Signed)
[facility]  Date of admission:  02/25/2015  Inpatient day:  02/28/2015   Consulting physician:  Dr. Vianne Bulls  Chief Complaint: Brandy Odom is a 64 y.o. female with metastatic small cell lung cancer who was admitted with intractable diffuse bone pain and poor oral intake.  Subjective: Decent pain control today. Still is fairly immobile, feels trapped in the room. Complains of double vision when looks to the right.   Past Medical History  Diagnosis Date  . Tobacco abuse   . Muscle spasm   . Small cell lung cancer [facility]     Metastatic    Past Surgical History  Procedure Laterality Date  . Appendectomy    . Colonoscopy with propofol N/A 02/15/2015    Procedure: COLONOSCOPY WITH PROPOFOL;  Surgeon: Lucilla Lame, MD;  Location: [facility] ENDOSCOPY;  Service: Endoscopy;  Laterality: N/A;    Family History  Problem Relation Age of Onset  . Colon cancer      Social History:  reports that she has been smoking Cigarettes.  She does not have any smokeless tobacco history on file. She reports that she does not drink alcohol or use illicit drugs.  She lives in [place] with her husband, [name].  She is alone today.  Allergies:  Allergies  Allergen Reactions  . Bee Venom Anaphylaxis  . Naproxen Nausea And Vomiting  . Sulfa Antibiotics Other (See Comments)    Reaction:  Unknown   . Antihistamines, Chlorpheniramine-Type Palpitations    Medications Prior to Admission  Medication Sig Dispense Refill  . diazepam (VALIUM) 5 MG tablet Take 1 tablet (5 mg total) by mouth every 8 (eight) hours as needed for muscle spasms. 20 tablet 0  . docusate sodium (COLACE) 100 MG capsule Take 1 capsule (100 mg total) by mouth 2 (two) times daily. 10 capsule 0  . fentaNYL (DURAGESIC - DOSED MCG/HR) 25 MCG/HR patch Place 1 patch (25 mcg total) onto the skin every 3 (three) days. 5 patch 0  . HYDROcodone-acetaminophen (NORCO) 5-325 MG tablet Take 1 tablet by mouth every 4 (four) hours as  needed for moderate pain. 20 tablet 0  . nicotine (NICODERM CQ - DOSED IN MG/24 HOURS) 21 mg/24hr patch Place 1 patch (21 mg total) onto the skin daily. 28 patch 0  . nystatin (MYCOSTATIN) 100000 UNIT/ML suspension Take 5 mLs (500,000 Units total) by mouth 4 (four) times daily. 60 mL 0    Review of Systems: GENERAL:  Fatigue.  No fever or sweats.  Weight loss. PERFORMANCE STATUS (ECOG):  4 HEENT:  Diplopia on right lateral gaze.  No runny nose, sore throat, mouth sores or tenderness. Lungs:  Chronic shortness of breath.  Rare cough.  No hemoptysis. Cardiac:  Chest/rib pain (worse with any coughing).  No palpitations, orthopnea, or PND. GI:  Poor appetite.  Constipation.  No nausea, vomiting, diarrhea,melena or hematochezia. GU:  No urgency, frequency, dysuria, or hematuria. Musculoskeletal: Diffuse bone pain (ribs, back, legs).  No muscle tenderness. Extremities:  No swelling. Skin:  No rashes or skin changes. Neuro:  General weakness. Right eye does not cross the midline when attempts to look to the right.  Endocrine:  No diabetes, thyroid issues, hot flashes or night sweats. Psych:  Anxiety.  Depression. Pain:  Bone pain, diffuse (worse on left side). Review of systems:  All other systems reviewed and found to be negative.  Physical Exam:  Blood pressure 135/85, pulse 94, temperature 97.5 F (36.4 C), temperature source Oral, resp. rate 18, height  5' 4"  (1.626 m), weight 140 lb (63.504 kg), SpO2 100 %.  GENERAL:  Chronically fatigued appearing woman lying comfortably on the medical unit in no acute distress. MENTAL STATUS:  Alert and oriented to person, place and time. HEAD:  Red shoulder length hair with dark roots.  Normocephalic, atraumatic, face symmetric, no Cushingoid features. EYES:  Brown eyes.  No conjunctivitis or scleral icterus. ENT: Oropharynx clear without lesion. Tongue normal. Mucous membranes moist.  RESPIRATORY: Scattered rhonchi. No wheezes. CARDIOVASCULAR:  Regular rate and rhythm without murmur, rub or gallop. ABDOMEN: Soft, non-tender, with active bowel sounds, and no appreciable hepatosplenomegaly. No masses. SKIN: No rashes or ulcers. EXTREMITIES: No edema, no skin discoloration or tenderness. No palpable cords. LYMPH NODES: No palpable cervical, supraclavicular, axillary or inguinal adenopathy  NEUROLOGICAL:  Stable. PSYCH: Appropriate.  Results for orders placed or performed during the hospital encounter of 02/25/15 (from the past 48 hour(s))  Basic metabolic panel     Status: Abnormal   Collection Time: 02/27/15  5:48 AM  Result Value Ref Range   Sodium 148 (H) 135 - 145 mmol/L   Potassium 3.6 3.5 - 5.1 mmol/L   Chloride 112 (H) 101 - 111 mmol/L   CO2 28 22 - 32 mmol/L   Glucose, Bld 103 (H) 65 - 99 mg/dL   BUN 6 6 - 20 mg/dL   Creatinine, Ser 0.71 0.44 - 1.00 mg/dL   Calcium 9.6 8.9 - 10.3 mg/dL   GFR calc non Af Amer >60 >60 mL/min   GFR calc Af Amer >60 >60 mL/min    Comment: (NOTE) The eGFR has been calculated using the CKD EPI equation. This calculation has not been validated in all clinical situations. eGFR's persistently <60 mL/min signify possible Chronic Kidney Disease.    Anion gap 8 5 - 15  Glucose, capillary     Status: None   Collection Time: 02/27/15  7:20 AM  Result Value Ref Range   Glucose-Capillary 88 65 - 99 mg/dL  Glucose, capillary     Status: Abnormal   Collection Time: 02/27/15 11:13 AM  Result Value Ref Range   Glucose-Capillary 105 (H) 65 - 99 mg/dL  Glucose, capillary     Status: Abnormal   Collection Time: 02/27/15  4:23 PM  Result Value Ref Range   Glucose-Capillary 102 (H) 65 - 99 mg/dL  CBC     Status: Abnormal   Collection Time: 02/28/15  5:15 AM  Result Value Ref Range   WBC 4.3 3.6 - 11.0 K/uL   RBC 3.98 3.80 - 5.20 MIL/uL   Hemoglobin 11.1 (L) 12.0 - 16.0 g/dL   HCT 32.9 (L) 35.0 - 47.0 %   MCV 82.6 80.0 - 100.0 fL   MCH 27.9 26.0 - 34.0 pg   MCHC 33.7 32.0 - 36.0 g/dL    RDW 17.4 (H) 11.5 - 14.5 %   Platelets 141 (L) 150 - 440 K/uL   No results found.  Assessment:  The patient is a 64 y.o. woman with metastatic small cell lung cancer. She has a greater than 50 pack year smoking history. She presented with a 28 pound weight loss over 2 months, nausea, constipation, diplopia on lateral gaze, and diffuse bone pain. Imaging studies reveal diffuse bone disease, mediastinal adenopathy, bilateral pulmonary nodules, bilateral adrenal metastasis, a suspicious liver lesion, and multiple brain metastasis.  CT guided right iliac crest biopsy on 02/19/2015 confirmed metastatic small cell carcinoma of the lung. CEA was 333.8 on 02/14/2015.  Colonoscopy on 02/15/2015  revealed a frond-like/villous partially obstructing non-circumferential mass in the cecum. 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 CEA was 333.8 on 02/14/2015.  Serologies revealed prior hepatitis B infection.  Her clinical status has improved significantly with increase in fentanyl dosage. Her appetite is better, she has been able to eat better today, but complains of feeling of food being stuck in her throat  Plan:   1.  Palliative care consult has been done. I also discussed potential treatment options versus hospice care. Pain control has been significantly better over the past 24 hours, bowel movements are regular. We'll continue with the current regimen. Dr. Mike Gip will discuss overall plan of care once again on Monday with the patient. 2.  Nutrition discussed. Patient continues to complain about food being stuck in her throat/esophagus. Will discuss with hospitalist necessity of 4 speech and swallow evaluation. 3.  Surgery contacted regarding port-a-cath placement. 4.  Radiation oncology contacted regarding initiation of cranial radiation. 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 5.  Social work to assist with medicare coverage.  Case was discussed with Dr. Beau Fanny, MD  02/28/2015, 9:47 AM

## 2015-02-28 NOTE — Plan of Care (Signed)
Problem: Safety: Goal: Ability to remain free from injury will improve Outcome: Progressing Patient with continuing weakness to lower extremities. Patient states that she feels weakness is worsening.  Patient is a 1 person assist to [facility] and a high fall risk. Bed is in lowest position with wheels locked and call bell and phone within reach. Patient without injury this shift.  Problem: Pain Managment: Goal: General experience of comfort will improve Outcome: Not Progressing Patient pain controlled this shift.  Dilaudid rescheduled to Q3h.  Patient with scheduled Oxycontin.  Norco available but not needed.  Patient continues to rate pain 9-10/10 with complaints of pain radiating across upper chest. Patient able to have longer periods of sleeping comfortably this shift.

## 2015-02-28 NOTE — Plan of Care (Signed)
Problem: Safety: Goal: Ability to remain free from injury will improve Outcome: Progressing Pt is moderate falls, bed alarm in use, call bell and phone within reach, pt sing both call bell and phone to call for assistance when needed  Problem: Fluid Volume: Goal: Ability to maintain a balanced intake and output will improve Outcome: Progressing VSS, afebrile, pt resting comfortably in bed  Problem: Pain Managment: Goal: General experience of comfort will improve Outcome: Progressing Pt has c/o pain in her chest from coughing and cancer, prn and scheduled pain meds given with improvement, pt resting comfortably in bed

## 2015-03-01 ENCOUNTER — Ambulatory Visit
Admission: RE | Admit: 2015-03-01 | Discharge: 2015-03-01 | Disposition: A | Discharge status: Home / Self Care | Payer: Medicaid Other | Source: Ambulatory Visit | Attending: Radiation Oncology | Admitting: Radiation Oncology

## 2015-03-01 ENCOUNTER — Ambulatory Visit: Payer: Medicaid Other

## 2015-03-01 DIAGNOSIS — C180 Malignant neoplasm of cecum: Secondary | ICD-10-CM | POA: Insufficient documentation

## 2015-03-01 DIAGNOSIS — C7972 Secondary malignant neoplasm of left adrenal gland: Secondary | ICD-10-CM | POA: Diagnosis present

## 2015-03-01 DIAGNOSIS — Z515 Encounter for palliative care: Secondary | ICD-10-CM | POA: Diagnosis present

## 2015-03-01 DIAGNOSIS — Z79899 Other long term (current) drug therapy: Secondary | ICD-10-CM | POA: Insufficient documentation

## 2015-03-01 DIAGNOSIS — Z6823 Body mass index (BMI) 23.0-23.9, adult: Secondary | ICD-10-CM | POA: Diagnosis not present

## 2015-03-01 DIAGNOSIS — F17210 Nicotine dependence, cigarettes, uncomplicated: Secondary | ICD-10-CM | POA: Insufficient documentation

## 2015-03-01 DIAGNOSIS — C7971 Secondary malignant neoplasm of right adrenal gland: Secondary | ICD-10-CM | POA: Insufficient documentation

## 2015-03-01 DIAGNOSIS — C787 Secondary malignant neoplasm of liver and intrahepatic bile duct: Secondary | ICD-10-CM | POA: Diagnosis present

## 2015-03-01 DIAGNOSIS — C7951 Secondary malignant neoplasm of bone: Secondary | ICD-10-CM | POA: Insufficient documentation

## 2015-03-01 DIAGNOSIS — F418 Other specified anxiety disorders: Secondary | ICD-10-CM | POA: Diagnosis present

## 2015-03-01 DIAGNOSIS — C3490 Malignant neoplasm of unspecified part of unspecified bronchus or lung: Secondary | ICD-10-CM | POA: Diagnosis present

## 2015-03-01 DIAGNOSIS — M898X0 Other specified disorders of bone, multiple sites: Secondary | ICD-10-CM | POA: Diagnosis present

## 2015-03-01 DIAGNOSIS — G893 Neoplasm related pain (acute) (chronic): Secondary | ICD-10-CM | POA: Diagnosis present

## 2015-03-01 DIAGNOSIS — E871 Hypo-osmolality and hyponatremia: Secondary | ICD-10-CM | POA: Diagnosis present

## 2015-03-01 DIAGNOSIS — C7931 Secondary malignant neoplasm of brain: Secondary | ICD-10-CM | POA: Insufficient documentation

## 2015-03-01 DIAGNOSIS — F4323 Adjustment disorder with mixed anxiety and depressed mood: Secondary | ICD-10-CM | POA: Diagnosis present

## 2015-03-01 DIAGNOSIS — Z79891 Long term (current) use of opiate analgesic: Secondary | ICD-10-CM | POA: Diagnosis not present

## 2015-03-01 DIAGNOSIS — Z598 Other problems related to housing and economic circumstances: Secondary | ICD-10-CM | POA: Diagnosis not present

## 2015-03-01 DIAGNOSIS — B370 Candidal stomatitis: Secondary | ICD-10-CM | POA: Diagnosis present

## 2015-03-01 DIAGNOSIS — C772 Secondary and unspecified malignant neoplasm of intra-abdominal lymph nodes: Secondary | ICD-10-CM | POA: Insufficient documentation

## 2015-03-01 DIAGNOSIS — R1310 Dysphagia, unspecified: Secondary | ICD-10-CM | POA: Diagnosis present

## 2015-03-01 DIAGNOSIS — Z66 Do not resuscitate: Secondary | ICD-10-CM | POA: Diagnosis present

## 2015-03-01 DIAGNOSIS — Z510 Encounter for antineoplastic radiation therapy: Secondary | ICD-10-CM | POA: Insufficient documentation

## 2015-03-01 DIAGNOSIS — E43 Unspecified severe protein-calorie malnutrition: Secondary | ICD-10-CM | POA: Diagnosis present

## 2015-03-01 DIAGNOSIS — E860 Dehydration: Secondary | ICD-10-CM | POA: Diagnosis present

## 2015-03-01 LAB — BASIC METABOLIC PANEL
Anion gap: 8 (ref 5–15)
CALCIUM: 9.6 mg/dL (ref 8.9–10.3)
CHLORIDE: 104 mmol/L (ref 101–111)
CO2: 30 mmol/L (ref 22–32)
CREATININE: 0.88 mg/dL (ref 0.44–1.00)
GFR calc Af Amer: >60 mL/min (ref >60)
GFR calc non Af Amer: >60 mL/min (ref >60)
GLUCOSE: 128 mg/dL — AB (ref 65–99)
Potassium: 3.8 mmol/L (ref 3.5–5.1)
Sodium: 142 mmol/L (ref 135–145)

## 2015-03-01 NOTE — Evaluation (Signed)
Clinical/Bedside Swallow Evaluation Patient Details  Name: Brandy Odom MRN: [id number] Date of Birth: Dec 10, 1950  Today's Date: 03/01/2015 Time: SLP Start Time (ACUTE ONLY): 1400 SLP Stop Time (ACUTE ONLY): 1500 SLP Time Calculation (min) (ACUTE ONLY): 60 min  Past Medical History:  Past Medical History  Diagnosis Date  . Tobacco abuse   . Muscle spasm   . Small cell lung cancer [facility])     Metastatic   Past Surgical History:  Past Surgical History  Procedure Laterality Date  . Appendectomy    . Colonoscopy with propofol N/A 02/15/2015    Procedure: COLONOSCOPY WITH PROPOFOL;  Surgeon: Lucilla Lame, MD;  Location: [facility] ENDOSCOPY;  Service: Endoscopy;  Laterality: N/A;   HPI:      Assessment / Plan / Recommendation Clinical Impression  Pt appeared to tolerate trials of purees and thin liquids(inconsistently) w/ no overt s/s of aspiration noted; no cough or throat clear or change in vocal quality or respiratory status noted. During trials of thin liquids via cup and straw, pt exhibited a mild, delayed throat clear/cough when drinking intermittently - it appeared to occur when pt took too large a sip from the cup and when she held the bolus too long in preparation for the swalow(which pt did not know she was doing). When the boluses were controlled and smaller in amount and when she used a straw, pt exhibited less overt s/s of aspiration w/ no change in vocal quality or respiratory status. Pt exhibited no signficnat oral phase deficits w/ trials given. Suspect pt is at mild increased risk for aspiration at this time sec. to her declined medical status and weakness presently. Pt required feeding assistance. Rec. a modified diet at this time d/t clinicial presentation. NSG updated.     Aspiration Risk  Mild aspiration risk    Diet Recommendation  mech soft diet; moistened meats; thin liquids; aspiration precautions; further f/u TBD  Medication Administration: Whole meds with puree     Other  Recommendations Recommended Consults:  (TBD) Oral Care Recommendations: Oral care BID;Staff/trained caregiver to provide oral care;Patient independent with oral care   Follow up Recommendations   (TBD)    Frequency and Duration min 3x week  1 week       Prognosis Prognosis for Safe Diet Advancement: Fair Barriers to Reach Goals:  (medical status)      Swallow Study   General Date of Onset: 02/25/15 Type of Study: Bedside Swallow Evaluation Previous Swallow Assessment: none Diet Prior to this Study: Regular;Thin liquids (but has not been eating and lost ~40lbs since 11/2014) Temperature Spikes Noted: No (wbc 4.3) Respiratory Status: Room air History of Recent Intubation: No Behavior/Cognition: Alert;Cooperative;Pleasant mood Oral Cavity Assessment: Dry ("thirsty") Oral Care Completed by SLP: Yes Oral Cavity - Dentition: Adequate natural dentition;Missing dentition Vision: Functional for self-feeding Self-Feeding Abilities: Able to feed self;Needs set up Patient Positioning: Upright in bed Baseline Vocal Quality: Normal (min. weak/decreased articulatory precision) Volitional Cough: Strong Volitional Swallow: Able to elicit    Oral/Motor/Sensory Function Overall Oral Motor/Sensory Function: Within functional limits (grossly)   Ice Chips Ice chips: Within functional limits Presentation: Self Fed;Spoon (3 trials)   Thin Liquid Thin Liquid: Impaired Presentation: Cup;Self Fed;Straw (2-3 trials via cup; 6-7 trials via straw) Oral Phase Impairments:  (none) Oral Phase Functional Implications:  (none) Pharyngeal  Phase Impairments: Cough - Delayed (x3 during trials)    Nectar Thick Nectar Thick Liquid: Not tested   Honey Thick Honey Thick Liquid: Not tested   Puree  Puree: Within functional limits Presentation: Self Fed;Spoon (8 trials)   Solid Solid: Not tested Other Comments: pt did not want trials      Orinda Kenner, MS,  CCC-SLP  Watson,Katherine 03/01/2015,4:09 PM

## 2015-03-01 NOTE — Consult Note (Signed)
[facility] Consult   Reason for Consult:  Follow-up for this 64 year old woman with a history of anxiety and depression currently suffering from severe pain related to metastatic cancer. Referring Physician:  Vianne Bulls Patient Identification: Brandy Odom MRN:  [id number] Principal Diagnosis: Intractable pain Diagnosis:   Patient Active Problem List   Diagnosis Date Noted  . Adjustment disorder with mixed anxiety and depressed mood [F43.23] 02/26/2015  . 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 Tumor associated pain [G89.3] 02/25/2015  . Dehydration [E86.0] 02/25/2015  . Intractable pain [R52] 02/25/2015  . 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 Abnormal radionuclide bone scan [R94.8]   . Adenopathy [R59.1]   . Abnormal CT of the abdomen [R93.5]   . Protein-calorie malnutrition, severe [E43] 02/14/2015  . Bone lesion [M89.9]   . Lesion of lung [J98.4]   . Orbital lesion [H05.89]   . Dyspnea [R06.00] 02/12/2015  . Cough [R05] 02/12/2015  . Tobacco abuse [Z72.0] 02/12/2015  . Metastasis ([place]) [C79.9] 02/12/2015  . Hypoxia [R09.02] 02/12/2015    Total Time spent with patient: 30 minutes  Subjective:   Brandy Odom is a 64 y.o. female patient admitted with "I'm feeling much better, I'm not depressed anymore".  HPI:  Follow-up for this patient seen late last week. At that time she was having a lot of pain and was feeling very down and anxious. I was unable to start an antidepressant because of her history of poor tolerance of antidepressant medicine. On evaluation today the patient says she is feeling much better. She feels that her pain is better controlled. She is sleeping a little bit better at night. She no longer feels hopeless and depressed. Patient however is rather irritated at a visit she had from the chaplain service today. She  says that they made her very upset and specifically asks that it not be repeated.  Past Psychiatric History: Patient has a history of long-standing depression and anxiety with poor tolerance of antidepressants.  Risk to Self: Is patient at risk for suicide?: No Risk to Others:   Prior Inpatient Therapy:   Prior Outpatient Therapy:    Past Medical History:  Past Medical History  Diagnosis Date  . Tobacco abuse   . Muscle spasm   . Small cell lung cancer [facility])     Metastatic    Past Surgical History  Procedure Laterality Date  . Appendectomy    . Colonoscopy with propofol N/A 02/15/2015    Procedure: COLONOSCOPY WITH PROPOFOL;  Surgeon: Lucilla Lame, MD;  Location: [facility] ENDOSCOPY;  Service: Endoscopy;  Laterality: N/A;   Family History:  Family History  Problem Relation Age of Onset  . Colon cancer     Family Psychiatric  History: Patient does not report any family history Social History:  History  Alcohol Use No     History  Drug Use No    Social History   Social History  . Marital Status: Married    Spouse Name: N/A  . Number of Children: N/A  . Years of Education: N/A   Social History Main Topics  . Smoking status: Current Every Day Smoker    Types: Cigarettes  . Smokeless tobacco: None  . Alcohol Use: No  . Drug Use: No  . Sexual Activity: Not Asked   Other Topics Concern  . None   Social History Narrative  Additional Social History:                          Allergies:   Allergies  Allergen Reactions  . Bee Venom Anaphylaxis  . Naproxen Nausea And Vomiting  . Sulfa Antibiotics Other (See Comments)    Reaction:  Unknown   . Antihistamines, Chlorpheniramine-Type Palpitations    Labs:  Results for orders placed or performed during the hospital encounter of 02/25/15 (from the past 48 hour(s))  CBC     Status: Abnormal   Collection Time: 02/28/15  5:15 AM  Result Value Ref Range   WBC 4.3 3.6 - 11.0 K/uL   RBC 3.98 3.80 - 5.20 MIL/uL    Hemoglobin 11.1 (L) 12.0 - 16.0 g/dL   HCT 32.9 (L) 35.0 - 47.0 %   MCV 82.6 80.0 - 100.0 fL   MCH 27.9 26.0 - 34.0 pg   MCHC 33.7 32.0 - 36.0 g/dL   RDW 17.4 (H) 11.5 - 14.5 %   Platelets 141 (L) 150 - 440 K/uL  Basic metabolic panel     Status: Abnormal   Collection Time: 03/01/15 10:32 AM  Result Value Ref Range   Sodium 142 135 - 145 mmol/L   Potassium 3.8 3.5 - 5.1 mmol/L   Chloride 104 101 - 111 mmol/L   CO2 30 22 - 32 mmol/L   Glucose, Bld 128 (H) 65 - 99 mg/dL   BUN <5 (L) 6 - 20 mg/dL   Creatinine, Ser 0.88 0.44 - 1.00 mg/dL   Calcium 9.6 8.9 - 10.3 mg/dL   GFR calc non Af Amer >60 >60 mL/min   GFR calc Af Amer >60 >60 mL/min    Comment: (NOTE) The eGFR has been calculated using the CKD EPI equation. This calculation has not been validated in all clinical situations. eGFR's persistently <60 mL/min signify possible Chronic Kidney Disease.    Anion gap 8 5 - 15    Current Facility-Administered Medications  Medication Dose Route Frequency Provider Last Rate Last Dose  . acetaminophen (TYLENOL) tablet 650 mg  650 mg Oral Q6H PRN Aldean Jewett, MD       Or  . acetaminophen (TYLENOL) suppository 650 mg  650 mg Rectal Q6H PRN Aldean Jewett, MD      . diazepam (VALIUM) tablet 10 mg  10 mg Oral Q8H PRN Epifanio Lesches, MD   10 mg at 03/01/15 1519  . feeding supplement (BOOST / RESOURCE BREEZE) liquid 1 Container  1 Container Oral TID WC Nicholes Mango, MD   1 Container at 02/27/15 0846  . fentaNYL (DURAGESIC - dosed mcg/hr) 75 mcg  75 mcg Transdermal Q72H Dustin Flock, MD   75 mcg at 02/28/15 1748  . heparin injection 5,000 Units  5,000 Units Subcutaneous 3 times per day Aldean Jewett, MD   5,000 Units at 03/01/15 1419  . HYDROmorphone (DILAUDID) injection 1 mg  1 mg Intravenous Q3H PRN Colleen Can, MD   1 mg at 03/01/15 1519  . megestrol (MEGACE) 40 MG/ML suspension 200 mg  200 mg Oral Daily Lequita Asal, MD   200 mg at 03/01/15 1010  .  nicotine (NICODERM CQ - dosed in mg/24 hours) patch 21 mg  21 mg Transdermal Daily Aldean Jewett, MD   21 mg at 03/01/15 1011  . nystatin (MYCOSTATIN) 100000 UNIT/ML suspension 500,000 Units  5 mL Oral QID Aldean Jewett, MD   500,000 Units at 03/01/15  1419  . oxyCODONE (Oxy IR/ROXICODONE) immediate release tablet 5 mg  5 mg Oral Q3H PRN Colleen Can, MD   5 mg at 02/28/15 1756  . oxyCODONE (OXYCONTIN) 12 hr tablet 15 mg  15 mg Oral Q12H Dustin Flock, MD   15 mg at 03/01/15 1010  . QUEtiapine (SEROQUEL) tablet 25 mg  25 mg Oral QHS Gonzella Lex, MD   25 mg at 02/28/15 2122  . senna (SENOKOT) tablet 17.2 mg  2 tablet Oral Daily Colleen Can, MD   17.2 mg at 03/01/15 1010    Musculoskeletal: Strength & Muscle Tone: decreased Gait & Station: ataxic Patient leans: N/A  Psychiatric Specialty Exam: Review of Systems  Constitutional: Negative.   HENT: Negative.   Eyes: Negative.   Respiratory: Negative.   Cardiovascular: Negative.   Gastrointestinal: Negative.   Musculoskeletal: Negative.   Skin: Negative.   Neurological: Negative.   Psychiatric/Behavioral: Negative for depression, suicidal ideas, hallucinations, memory loss and substance abuse. The patient has insomnia. The patient is not nervous/anxious.     Blood pressure 95/66, pulse 88, temperature 98.5 F (36.9 C), temperature source Oral, resp. rate 19, height _0  (1.626 m), weight 63.504 kg (140 lb), SpO2 95 %.Body mass index is 24.02 kg/(m^2).  General Appearance: Fairly Groomed  Engineer, water::  Good  Speech:  Normal Rate  Volume:  Normal  Mood:  Euthymic  Affect:  Congruent  Thought Process:  Goal Directed  Orientation:  Full (Time, Place, and Person)  Thought Content:  Negative  Suicidal Thoughts:  No  Homicidal Thoughts:  No  Memory:  Immediate;   Good Recent;   Fair Remote;   Fair  Judgement:  Fair  Insight:  Fair  Psychomotor Activity:  Decreased  Concentration:  Fair  Recall:  [employer] of Knowledge:Fair  Language: Fair  Akathisia:  No  Handed:  Right  AIMS (if indicated):     Assets:  Communication Skills Desire for Improvement Financial Resources/Insurance Housing Intimacy Resilience Social Support  ADL's:  Intact  Cognition: WNL  Sleep:      Treatment Plan Summary: Daily contact with patient to assess and evaluate symptoms and progress in treatment, Medication management and Plan Patient appears to be much improved. No need to change psychiatric medicine. Continue low dose of Seroquel to assess with sleep. Supportive counseling done. I promised the patient I would try to help her to avoid unwanted visits in the future. I put in an order to nursing asking the chaplain's not visit the patient again. Patient supported in her right to decline visits from people that she does not want to see. I will continue to follow-up as needed.  Disposition: No evidence of imminent risk to self or others at present.   Patient does not meet criteria for psychiatric inpatient admission. Supportive therapy provided about ongoing stressors.  John Clapacs 03/01/2015 5:27 PM

## 2015-03-01 NOTE — Progress Notes (Signed)
[facility] [place] NAME: Saretta Dahlem    MR#:  [id number]  DATE OF BIRTH:  1950/08/01  SUBJECTIVE: 64 year old female patient admitted for intractable bone pains and poor by mouth intake. Recently diagnosed metastatic lung cancer.  Says her pain is well controlled. Anxiety also is improved with increased dose of Valium..   CHIEF COMPLAINT:  No chief complaint on file.   REVIEW OF SYSTEMS:   ROS CONSTITUTIONAL: No fever, fatigue or weakness.  EYES: Double vision. EARS, NOSE, AND THROAT: No tinnitus or ear pain. Difficulty swallowing. RESPIRATORY: No cough, shortness of breath, wheezing or hemoptysis.  CARDIOVASCULAR: No chest pain, orthopnea, edema.  GASTROINTESTINAL: No nausea, vomiting, diarrhea or abdominal pain.  GENITOURINARY: No dysuria, hematuria.  ENDOCRINE: No polyuria, nocturia,  HEMATOLOGY: No anemia, easy bruising or bleeding SKIN: No rash or lesion. MUSCULOSKELETAL: Back pain issues.  NEUROLOGIC: No tingling, numbness, weakness.  PSYCHIATRY: Anxiety and panic attacks. Wants to talk to her son who lives in [place].  DRUG ALLERGIES:   Allergies  Allergen Reactions  . Bee Venom Anaphylaxis  . Naproxen Nausea And Vomiting  . Sulfa Antibiotics Other (See Comments)    Reaction:  Unknown   . Antihistamines, Chlorpheniramine-Type Palpitations    VITALS:  Blood pressure 104/64, pulse 88, temperature 98.8 F (37.1 C), temperature source Oral, resp. rate 18, height '5\' 4"'$  (1.626 m), weight 63.504 kg (140 lb), SpO2 93 %.  PHYSICAL EXAMINATION:  GENERAL:  64 y.o.-year-old patient lying in the bed with no acute distress.  EYES: Pupils equal, round, reactive to light and accommodation. No scleral icterus. Extraocular muscles intact.  HEENT: Head atraumatic, normocephalic. Oropharynx and nasopharynx clear.  NECK:  Supple, no jugular venous distention. No thyroid enlargement, no tenderness.  LUNGS: Normal breath  sounds bilaterally, no wheezing, rales,rhonchi or crepitation. No use of accessory muscles of respiration.  CARDIOVASCULAR: S1, S2 normal. No murmurs, rubs, or gallops.  ABDOMEN: Soft, nontender, nondistended. Bowel sounds present. No organomegaly or mass.  EXTREMITIES: No pedal edema, cyanosis, or clubbing.  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  Sensation intact. Gait not checked.  PSYCHIATRIC: The patient is alert and oriented x 3.  SKIN: No obvious rash, lesion, or ulcer.    LABORATORY PANEL:   CBC  Recent Labs Lab 02/28/15 0515  WBC 4.3  HGB 11.1*  HCT 32.9*  PLT 141*   ------------------------------------------------------------------------------------------------------------------  Chemistries   Recent Labs Lab 02/25/15 1612 02/27/15 0548  NA 143 148*  K 3.4* 3.6  CL 103 112*  CO2 32 28  GLUCOSE 103* 103*  BUN 7 6  CREATININE 0.80 0.71  CALCIUM 9.7 9.6  AST 33  --   ALT 41  --   ALKPHOS 173*  --   BILITOT 0.8  --    ------------------------------------------------------------------------------------------------------------------  Cardiac Enzymes No results for input(s): TROPONINI in the last 168 hours. ------------------------------------------------------------------------------------------------------------------  RADIOLOGY:  No results found.  EKG:   Orders placed or performed during the hospital encounter of 02/12/15  . ED EKG  . ED EKG    ASSESSMENT AND PLAN:   Intractable back pain and whole body pain secondary to metastatic small cell lung cancer: Continue fentanyl patch, IV Dilaudid, OxyContin has been added on admission . Appreciate palliative care to see the patient, and manage the pain. #2 surgery contacted for Port-A-Cath placement, radiation oncology for starting the cranial radiation #3 Megace for appetite stimulation #4 dysphagia consulted speech therapy for possible MBS evaluation. 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 #6. anxiety and depression due to new diagnosis of metastatic lung cancer: Started on Seroquel 25 mg at night. 7.anxiety increased the Valium dose. #8 hyponatremia secondary to dehydration increases the by mouth intake.  #9 severe malnutrition in the context of chronic illness: Continue  Boost Breeze,milk shakes   PROGNOSIS is poor CODE STATUS DO NOT RESUSCITATE  All the records are reviewed and case discussed with Care Management/Social Workerr. Management plans discussed with the patient, family and they are in agreement.  CODE STATUS: DO NOT RESUSCITATE  TOTAL TIME TAKING CARE OF THIS PATIENT:36 minutes.   POSSIBLE D/C IN 5-7DAYS, DEPENDING ON CLINICAL CONDITION.   Epifanio Lesches M.D on 03/01/2015 at 9:37 AM  Between 7am to 6pm - Pager - [phone]  After 6pm go to [email or web address] - password [email or web address] [facility]  [facility]  Office  [phone]  CC: Primary care physician; No PCP Per Patient   Note: This dictation was prepared with Dragon dictation along with smaller phrase technology. Any transcriptional errors that result from this process are unintentional.

## 2015-03-01 NOTE — Plan of Care (Signed)
Problem: Safety: Goal: Ability to remain free from injury will improve Outcome: Progressing Pt remains free from falls.  Calls for assistance to BR.  Bed in lowest position, bed alarm on, call bell and phone within reach.    Problem: Fluid Volume: Goal: Ability to maintain a balanced intake and output will improve Outcome: Progressing VSS.  Pt attempting to eat snacks.    Problem: Pain Managment: Goal: General experience of comfort will improve Outcome: Progressing Pain managed with dilaudid and oxycontin

## 2015-03-01 NOTE — Progress Notes (Signed)
   03/01/15 1500  Clinical Encounter Type  Visited With Patient  Visit Type Initial  Referral From Chaplain  Consult/Referral To Chaplain  Spiritual Encounters  Spiritual Needs Emotional  Stress Factors  Patient Stress Factors Health changes;Exhausted  Visited with patient who seemed exhausted about current situation. Was not very agreeable with talking about husband and his support of her or about her possible treatment. Said she didn't want to proceed. Concerned about emotional well-being. [place]

## 2015-03-01 NOTE — Progress Notes (Addendum)
Pt has had better po intake today.  Problem: Safety: Goal: Ability to remain free from injury will improve Outcome: Progressing Pt is moderate falls, bed alarm in use, call bell and phone within reach, pt sing both call bell and phone to call for assistance when needed  Problem: Fluid Volume: Goal: Ability to maintain a balanced intake and output will improve Outcome: Progressing VSS, afebrile, pt resting comfortably in bed  Problem: Pain Managment: Goal: General experience of comfort will improve Outcome: Progressing Pt has c/o pain in her chest from coughing and cancer, prn and scheduled pain meds given with improvement, pt resting comfortably in bed

## 2015-03-01 NOTE — Progress Notes (Signed)
Pt was to have brain radiation at cancer center today.  Pt does not wish to have radiation today but would like to try another day.  Wants someone to come talk to her about radiation and answer her questions.  Wendy at cancer center notified.  Abigail Butts offered to come over and speak with the pt today and answer some questions.  Will retry radiation tomorrow.  Clarise Cruz, RN

## 2015-03-02 ENCOUNTER — Ambulatory Visit
Admit: 2015-03-02 | Discharge: 2015-03-02 | Disposition: A | Discharge status: Home / Self Care | Payer: Medicaid Other | Attending: Radiation Oncology | Admitting: Radiation Oncology

## 2015-03-02 ENCOUNTER — Ambulatory Visit: Payer: Medicaid Other

## 2015-03-02 DIAGNOSIS — R0602 Shortness of breath: Secondary | ICD-10-CM

## 2015-03-02 DIAGNOSIS — C7800 Secondary malignant neoplasm of unspecified lung: Secondary | ICD-10-CM

## 2015-03-02 DIAGNOSIS — C3490 Malignant neoplasm of unspecified part of unspecified bronchus or lung: Secondary | ICD-10-CM

## 2015-03-02 DIAGNOSIS — Z79899 Other long term (current) drug therapy: Secondary | ICD-10-CM

## 2015-03-02 DIAGNOSIS — R05 Cough: Secondary | ICD-10-CM

## 2015-03-02 DIAGNOSIS — Z515 Encounter for palliative care: Secondary | ICD-10-CM

## 2015-03-02 MED ORDER — HYDROMORPHONE HCL 2 MG PO TABS
1.0000 mg | ORAL_TABLET | ORAL | Status: DC | PRN
  Filled 2015-03-02: qty 1

## 2015-03-02 MED ORDER — HYDROMORPHONE HCL 2 MG PO TABS
2.0000 mg | ORAL_TABLET | ORAL | Status: DC | PRN
  Administered 2015-03-02 – 2015-03-06 (×9): 2 mg via ORAL
  Filled 2015-03-02 (×8): qty 1

## 2015-03-02 NOTE — Progress Notes (Signed)
Palliative Medicine Inpatient Consult Follow Up Note   Name: Brandy Odom Date: 03/02/2015 MRN: [id number]  DOB: 11-Sep-1950  Referring Physician: Epifanio Lesches, MD  Palliative Care consult requested for this 64 y.o. female for goals of medical therapy in patient with metestatic small cell cancer.    TODAY'S DISCUSSIONS AND DECISIONS:  1.  My goal for today was to clarify the goals of care further.  Pt seems to be getting mixed messages about treatment vs comfort care only. She has pressure from family members to choose one over the other--and they disagree!   Husband strongly favors treatment (either radiation or chemo or both) and he got very upset about radiation being cancelled after pt put off getting radiation (again). Security had to be called by nurse due to husband being so upset and blaming staff for radiation not being done.   A sister-in-law came and told pt chemo would make her very sick and she should not do chemo.  The pt does not want her to visit again.  My impression is that pt has agreed to treatment mostly to please her husband --rather than herself. Also --her resistance to hearing about Hospice on Friday, when I spoke to her about this as an option --was due to her need to show that she is 'trying to try'.  But her actions of delaying radiation, etc, represents what she really wants --which is comfort.  That is my take on the situation, though she will not admit to this and will probably not even agree to it readily.   All chemo would be palliative anyway, so perhaps she could have hospice care in the home and palliative chemo? That would be a question for Hospice to address. Right now, pt seems to be sending mixed messages about what she wants. I encouraged her to not 'go back and forth from day to day about what she wants' but I also told her 'it is OK for you to change your mind --just not daily'.    2.  Pt and husband have limited funds but would not be Medicaid  eligible(per report) and have no insurance.  Notes refer to Social Work ' helping with insurance', etc.  But there is no insurance and won't be any.  Pt can, however, be self-pay with Hospice (and charity is provided to the extent that a patient qualifies for charity care). There are issues with their finances and conflicting information was provided to me today.  I asked how she got her pain medications as an outpt and she said "I didn't get any --I just got them in the ER because we couldn't afford them."  Then, husband said, I bought them and she had them.  At that point, she then changed her story and said that he did purchase her pain medications when she was home.  I suspect that she did not have pain meds or at least not all of what was prescribed because they could not afford to buy them. Perhaps we could confirm the purchase of the last set of RXs?   3.  I was asked about why no radiation and I deferred that question to pts oncology providers. I did tell pt that, often, a patient needs to be moving about and taking care of themselves in order to be eligible to get chemo or radiation and that once someone is in the bed all the time, the chemo and radiation can do more harm than good and so it isn't  given at that point.  Pt is now requiring assistance with meals at times and going to the bathroom is very hard for her.  She would like a bedside commode at home.    4.  Pt is obsessed with having her house cleaned. She says she does get a Airline pilot and that they are going to use that to pay for someone to come in and clean. She really just wants a clean house and good comfort management at home --and then she will be 'happy to go home'.    5.  Will change over to oral narcotics --pt aware.    6.  Mild dysphagia --requiring a mech soft diet with moist meats and thins and aspiration precautions.  Whole meds with pureed foods.       ________________________________________   IMPRESSION: Metestatic Small Cell Lung Cancer Carcinoid Hyponatremia  Anxiety Depression Bone pain and other pain associated with cancer lesions and bedbound status Decreased appetite Severe Malnutrition in the context of terminal cancer  _________________________________________   REVIEW OF SYSTEMS:  Pain is currently controlled NO vomiting No appetite Limited mobility continues  CODE STATUS: DNR   PAST MEDICAL HISTORY: Past Medical History  Diagnosis Date  . Tobacco abuse   . Muscle spasm   . Small cell lung cancer ([place])     Metastatic    PAST SURGICAL HISTORY:  Past Surgical History  Procedure Laterality Date  . Appendectomy    . Colonoscopy with propofol N/A 02/15/2015    Procedure: COLONOSCOPY WITH PROPOFOL;  Surgeon: Lucilla Lame, MD;  Location: [facility] ENDOSCOPY;  Service: Endoscopy;  Laterality: N/A;    Vital Signs: BP 131/91 mmHg  Pulse 97  Temp(Src) 98.3 F (36.8 C) (Oral)  Resp 18  Ht '5\' 4"'$  (1.626 m)  Wt 63.504 kg (140 lb)  BMI 24.02 kg/m2  SpO2 97% Filed Weights   02/25/15 2000 02/26/15 0532  Weight: 62.778 kg (138 lb 6.4 oz) 63.504 kg (140 lb)    Estimated body mass index is 24.02 kg/(m^2) as calculated from the following:   Height as of this encounter: '5\' 4"'$  (1.626 m).   Weight as of this encounter: 63.504 kg (140 lb).  PHYSICAL EXAM: Actually seen to be sitting up in her bed reading the TV channel list. Says pain is currently controlled and has been generally controlled while here per her report. Alert and oriented --the best I have seen her since I have been here (she was more logical also) EOMI OP clear Neck w/o JVD or TM Hrt rrr no m Lungs cta --occas cough Abd soft and NT Skin w/o Mottling or cyanosis  LABS: CBC:    Component Value Date/Time   WBC 4.3 02/28/2015 0515   WBC 9.0 04/10/2012 1601   HGB 11.1* 02/28/2015 0515   HGB 16.0 04/10/2012 1601   HCT 32.9* 02/28/2015 0515    HCT 46.4 04/10/2012 1601   PLT 141* 02/28/2015 0515   PLT 222 04/10/2012 1601   MCV 82.6 02/28/2015 0515   MCV 85 04/10/2012 1601   NEUTROABS 5.6 02/25/2015 1612   LYMPHSABS 0.9* 02/25/2015 1612   MONOABS 0.4 02/25/2015 1612   EOSABS 0.1 02/25/2015 1612   BASOSABS 0.0 02/25/2015 1612   Comprehensive Metabolic Panel:    Component Value Date/Time   NA 142 03/01/2015 1032   NA 138 04/10/2012 1601   K 3.8 03/01/2015 1032   K 3.9 04/10/2012 1601   CL 104 03/01/2015 1032   CL 106 04/10/2012 1601  CO2 30 03/01/2015 1032   CO2 27 04/10/2012 1601   BUN <5* 03/01/2015 1032   BUN 12 04/10/2012 1601   CREATININE 0.88 03/01/2015 1032   CREATININE 0.89 04/10/2012 1601   GLUCOSE 128* 03/01/2015 1032   GLUCOSE 97 04/10/2012 1601   CALCIUM 9.6 03/01/2015 1032   CALCIUM 9.7 04/10/2012 1601   AST 33 02/25/2015 1612   ALT 41 02/25/2015 1612   ALKPHOS 173* 02/25/2015 1612   BILITOT 0.8 02/25/2015 1612   PROT 6.8 02/25/2015 1612   ALBUMIN 3.1* 02/25/2015 1612    More than 50% of the visit was spent in counseling/coordination of care: YES  Time Spent:  45 min

## 2015-03-02 NOTE — Progress Notes (Signed)
[facility] at [place] NAME: Brandy Odom    MR#:  [id number]  DATE OF BIRTH:  1950/06/19  SUBJECTIVE: 64 year old female patient admitted for intractable bone pains and poor by mouth intake. Recently diagnosed metastatic lung cancer.   seen at bedside and overall her pain is controlled. Still want to pursue the treatment for cancer. One   CHIEF COMPLAINT:  No chief complaint on file.   REVIEW OF SYSTEMS:   ROS CONSTITUTIONAL: No fever, fatigue or weakness.  EYES: Double vision. EARS, NOSE, AND THROAT: No tinnitus or ear pain. Difficulty swallowing. RESPIRATORY: No cough, shortness of breath, wheezing or hemoptysis.  CARDIOVASCULAR: No chest pain, orthopnea, edema.  GASTROINTESTINAL: Had mild abdominal pain left lower quadrant. No nausea, no vomiting. GENITOURINARY: No dysuria, hematuria.  ENDOCRINE: No polyuria, nocturia,  HEMATOLOGY: No anemia, easy bruising or bleeding SKIN: No rash or lesion. MUSCULOSKELETAL: Back pain issues.  NEUROLOGIC: No tingling, numbness, weakness.  PSYCHIATRY: Anxiety and panic attacks. Wants to talk to her son who lives in [place].  DRUG ALLERGIES:   Allergies  Allergen Reactions  . Bee Venom Anaphylaxis  . Naproxen Nausea And Vomiting  . Sulfa Antibiotics Other (See Comments)    Reaction:  Unknown   . Antihistamines, Chlorpheniramine-Type Palpitations    VITALS:  Blood pressure 131/91, pulse 97, temperature 98.3 F (36.8 C), temperature source Oral, resp. rate 18, height '5\' 4"'$  (1.626 m), weight 63.504 kg (140 lb), SpO2 97 %.  PHYSICAL EXAMINATION:  GENERAL:  64 y.o.-year-old patient lying in the bed with no acute distress.  EYES: Pupils equal, round, reactive to light and accommodation. No scleral icterus. Extraocular muscles intact.  HEENT: Head atraumatic, normocephalic. Oropharynx and nasopharynx clear.  NECK:  Supple, no jugular venous distention. No thyroid enlargement, no  tenderness.  LUNGS: Normal breath sounds bilaterally, no wheezing, rales,rhonchi or crepitation. No use of accessory muscles of respiration.  CARDIOVASCULAR: S1, S2 normal. No murmurs, rubs, or gallops.  ABDOMEN: Soft, nontender, nondistended. Bowel sounds present. No organomegaly or mass.  EXTREMITIES: No pedal edema, cyanosis, or clubbing.  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  Sensation intact. Gait not checked.  PSYCHIATRIC: The patient is alert and oriented x 3.  SKIN: No obvious rash, lesion, or ulcer.    LABORATORY PANEL:   CBC  Recent Labs Lab 02/28/15 0515  WBC 4.3  HGB 11.1*  HCT 32.9*  PLT 141*   ------------------------------------------------------------------------------------------------------------------  Chemistries   Recent Labs Lab 02/25/15 1612  03/01/15 1032  NA 143  < > 142  K 3.4*  < > 3.8  CL 103  < > 104  CO2 32  < > 30  GLUCOSE 103*  < > 128*  BUN 7  < > <5*  CREATININE 0.80  < > 0.88  CALCIUM 9.7  < > 9.6  AST 33  --   --   ALT 41  --   --   ALKPHOS 173*  --   --   BILITOT 0.8  --   --   < > = values in this interval not displayed. ------------------------------------------------------------------------------------------------------------------  Cardiac Enzymes No results for input(s): TROPONINI in the last 168 hours. ------------------------------------------------------------------------------------------------------------------  RADIOLOGY:  No results found.  EKG:   Orders placed or performed during the hospital encounter of 02/12/15  . ED EKG  . ED EKG    ASSESSMENT AND PLAN:   Intractable back pain and whole body pain secondary to metastatic small cell lung cancer: Continue fentanyl patch,  IV Dilaudid, OxyContin has been added on admission . Appreciate palliative care to see the patient, and manage the pain. #2 surgery contacted for Port-A-Cath placement, chemotherapy to be started soon and then radiation after the  chemotherapy.  #3 Megace for appetite stimulation #4 dysphagia' by speech therapy. 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 Prognosis is poor. #6. anxiety and depression due to new diagnosis of metastatic lung cancer: Started on Seroquel 25 mg at night. Appreciate psych follow-up. 7.anxiety increased the Valium dose. #8 hyponatremia secondary to dehydration increases the by mouth intake.  #9 severe malnutrition in the context of chronic illness: Continue  Boost Breeze,milk shakes   PROGNOSIS is poor CODE STATUS DO NOT RESUSCITATE  All the records are reviewed and case discussed with Care Management/Social Workerr. Management plans discussed with the patient, family and they are in agreement.  CODE STATUS: DO NOT RESUSCITATE  TOTAL TIME TAKING CARE OF THIS PATIENT:36 minutes.   POSSIBLE D/C IN  5-7DAYS, DEPENDING ON CLINICAL CONDITION.   Epifanio Lesches M.D on 03/02/2015 at 9:28 AM  Between 7am to 6pm - Pager - [phone]  After 6pm go to [email or web address] - password [email or web address]  [facility]  Office  [phone]  CC: Primary care physician; No PCP Per Patient   Note: This dictation was prepared with Dragon dictation along with smaller phrase technology. Any transcriptional errors that result from this process are unintentional.

## 2015-03-02 NOTE — Plan of Care (Signed)
Problem: Safety: Goal: Ability to remain free from injury will improve Outcome: Progressing Pt remains free from injury.  Bed alarm on, bed in lowest position, call bell and phone within reach.  Problem: Fluid Volume: Goal: Ability to maintain a balanced intake and output will improve Outcome: Progressing Pt trying to eat snacks during the night.       Problem: Pain Managment: Goal: General experience of comfort will improve Outcome: Progressing Pain in managed with scheduled oxycontin and PRN dilaudid and oxycodone.  Pt takes valium PRN Q12 for spasms.

## 2015-03-02 NOTE — Progress Notes (Signed)
It was reported to this nurse that pt's husband was present in pt's room with the door shut and could be heard yelling at the pt for refusing radiation on Monday.  Security happen to be present on unit and was asked by this nurse to come to pt's room.  Security guard and this nurse entered room to find pt and her husband having a discussion.  Pt's husband was in fact standing at side of bed yelling at her.  Husband was taken into hall where he was spoken to by security and this nurse.  Husband calmed and then he left the hospital.  Husband did come back later in the day and apologize to this nurse for his behavior. Clarise Cruz, RN

## 2015-03-02 NOTE — Progress Notes (Signed)
[facility]  Date of admission:  02/25/2015  Inpatient day:  03/01/2015  Consulting physician:  Dr. Nicholes Mango   Chief Complaint: Brandy Odom is a 64 y.o. female with metastatic small cell lung cancer who was admitted with intractable diffuse bone pain and poor oral intake.  Subjective:  Feels much better today.  Pain controlled.  Eating more.  Past Medical History  Diagnosis Date  . Tobacco abuse   . Muscle spasm   . Small cell lung cancer [facility])     Metastatic    Past Surgical History  Procedure Laterality Date  . Appendectomy    . Colonoscopy with propofol N/A 02/15/2015    Procedure: COLONOSCOPY WITH PROPOFOL;  Surgeon: Lucilla Lame, MD;  Location: [facility] ENDOSCOPY;  Service: Endoscopy;  Laterality: N/A;    Family History  Problem Relation Age of Onset  . Colon cancer      Social History:  reports that she has been smoking Cigarettes.  She does not have any smokeless tobacco history on file. She reports that she does not drink alcohol or use illicit drugs.  She lives in [place] with her husband, [name].  She is accompanied by her husband  Today.  She continues to be concerned about having her house cleaned.  Allergies:  Allergies  Allergen Reactions  . Bee Venom Anaphylaxis  . Naproxen Nausea And Vomiting  . Sulfa Antibiotics Other (See Comments)    Reaction:  Unknown   . Antihistamines, Chlorpheniramine-Type Palpitations    Medications Prior to Admission  Medication Sig Dispense Refill  . diazepam (VALIUM) 5 MG tablet Take 1 tablet (5 mg total) by mouth every 8 (eight) hours as needed for muscle spasms. 20 tablet 0  . docusate sodium (COLACE) 100 MG capsule Take 1 capsule (100 mg total) by mouth 2 (two) times daily. 10 capsule 0  . fentaNYL (DURAGESIC - DOSED MCG/HR) 25 MCG/HR patch Place 1 patch (25 mcg total) onto the skin every 3 (three) days. 5 patch 0  . HYDROcodone-acetaminophen (NORCO) 5-325 MG tablet Take 1 tablet by mouth every 4  (four) hours as needed for moderate pain. 20 tablet 0  . nicotine (NICODERM CQ - DOSED IN MG/24 HOURS) 21 mg/24hr patch Place 1 patch (21 mg total) onto the skin daily. 28 patch 0  . nystatin (MYCOSTATIN) 100000 UNIT/ML suspension Take 5 mLs (500,000 Units total) by mouth 4 (four) times daily. 60 mL 0    Review of Systems: GENERAL:  Fatigue.  No fever or sweats.  Weight loss. PERFORMANCE STATUS (ECOG):  2-3 HEENT:  Diplopia on right lateral gaze.  No runny nose, sore throat, mouth sores or tenderness. Lungs:  No increased shortness of breath.  Rare cough.  No hemoptysis. Cardiac:  Chest/rib pain (improved).  No palpitations, orthopnea, or PND. GI:   Appetite, improved.  No nausea, vomiting, diarrhea,melena or hematochezia. GU:  No urgency, frequency, dysuria, or hematuria. Musculoskeletal: Diffuse bone pain (ribs, back, legs), improved.  No muscle tenderness. Extremities:  No swelling. Skin:  No rashes or skin changes. Neuro:  General weakness.  No headache, numbness or weakness, balance or coordination issues. Endocrine:  No diabetes, thyroid issues, hot flashes or night sweats. Psych:  Anxiety.  Depression. Pain:  Bone pain, diffuse (improved). Review of systems:  All other systems reviewed and found to be negative.  Physical Exam:  Blood pressure 131/91, pulse 97, temperature 98.3 F (36.8 C), temperature source Oral, resp. rate 18, height 5' 4" (1.626 m), weight 140  lb (63.504 kg), SpO2 97 %.  GENERAL:  Chronically fatigued appearing woman sitting up on the medical unit in no acute distress. MENTAL STATUS:  Alert and oriented to person, place and time. HEAD:  Red shoulder length hair with dark roots.  Normocephalic, atraumatic, face symmetric, no Cushingoid features. EYES:  Brown eyes.  No conjunctivitis or scleral icterus. EXTREMITIES: No edema, no skin discoloration or tenderness.  NEUROLOGICAL:  Stable. PSYCH: Appropriate.  Brighter affect.  Results for orders placed or  performed during the hospital encounter of 02/25/15 (from the past 48 hour(s))  Basic metabolic panel     Status: Abnormal   Collection Time: 03/01/15 10:32 AM  Result Value Ref Range   Sodium 142 135 - 145 mmol/L   Potassium 3.8 3.5 - 5.1 mmol/L   Chloride 104 101 - 111 mmol/L   CO2 30 22 - 32 mmol/L   Glucose, Bld 128 (H) 65 - 99 mg/dL   BUN <5 (L) 6 - 20 mg/dL   Creatinine, Ser 0.88 0.44 - 1.00 mg/dL   Calcium 9.6 8.9 - 10.3 mg/dL   GFR calc non Af Amer >60 >60 mL/min   GFR calc Af Amer >60 >60 mL/min    Comment: (NOTE) The eGFR has been calculated using the CKD EPI equation. This calculation has not been validated in all clinical situations. eGFR's persistently <60 mL/min signify possible Chronic Kidney Disease.    Anion gap 8 5 - 15   No results found.  Assessment:  The patient is a 64 y.o. woman with metastatic small cell lung cancer. She has a greater than 50 pack year smoking history. She presented with a 28 pound weight loss over 2 months, nausea, constipation, diplopia on lateral gaze, and diffuse bone pain. Imaging studies reveal diffuse bone disease, mediastinal adenopathy, bilateral pulmonary nodules, bilateral adrenal metastasis, a suspicious liver lesion, and multiple brain metastasis.  CT guided right iliac crest biopsy on 02/19/2015 confirmed metastatic small cell carcinoma of the lung. CEA was 333.8 on 02/14/2015.  Colonoscopy on 02/15/2015 revealed a frond-like/villous partially obstructing non-circumferential mass in the cecum. 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 CEA was 333.8 on 02/14/2015.  Serologies revealed prior hepatitis B infection.  Symptomatically, her bone pain has improved. Oral intake has increased on Megace.   Plan:   1.  Hematology/Oncolgy:  Discussed with patient thoughts about chemotherapy and cranial radiation.  Patient still desires treatment.  Anticipate starting systemic chemotherapy after completion of radiation.  Patient  desires port placement.  Pain decreased on current regimen of Fentanyl 75 mcg/hr and Oxycontin 15 mg q 12 hrs , and oxycodone immediate release 5 mg prn. Consider discontinuing Dilaudid IV prn to ensure pain controlled prior to discharge.  2.  Psychiatry:  Appreciate consult.  Patient taking Valium 10 mg q 8 hours prn and Seroquel. 3.  Fluids/Electrolytes/Nutrition:  Sodium improved.  Increased appetite on Megace.  Swallowing study reviewed. 4.  Infectious disease:  Thrush resolved on Nystatin. 5.  Disposition:  Anticipate discharge home in next 1-2 days.   Thank you for allowing me to participate in Brandy Odom 's care.  I will follow her closely with you while hospitalized and after discharge in the outpatient department.   Lequita Asal, MD  03/01/2015

## 2015-03-02 NOTE — Progress Notes (Signed)
Nutrition Follow-up  DOCUMENTATION CODES:   Severe malnutrition in context of chronic illness  INTERVENTION:   Meals and Snacks: Cater to patient preferences Medical Food Supplement Therapy: clarified Ensure has been discontinued, continue Boost Breeze po TID, each supplement provides 250 kcal and 9 grams of protein   NUTRITION DIAGNOSIS:   Malnutrition related to cancer and cancer related treatments as evidenced by energy intake < or equal to 75% for > or equal to 1 month, percent weight loss, severe depletion of muscle mass  GOAL:   Patient will meet greater than or equal to 90% of their needs  MONITOR:    (Energy Intake, Anthropometrics, Digestive System, Electrolyte and renal Profile)  REASON FOR ASSESSMENT:   Malnutrition Screening Tool    ASSESSMENT:   Pt admitted with intractible pain. Pt with recent diagnosis of small cell lung cancer with bony mets; oncology following.  Per MD note, pt deciding over treatment options; chemotherapy and/or radiation. Pt unavailable on multiple visits today. Per RN pt refused Megace this am.  Diet Order:  Diet regular Room service appropriate?: Yes; Fluid consistency:: Thin    Current Nutrition: Limited documentation as pt on isolation.   Gastrointestinal Profile: Last BM: 03/02/2015   Scheduled Medications:  . feeding supplement  1 Container Oral TID WC  . fentaNYL  75 mcg Transdermal Q72H  . heparin  5,000 Units Subcutaneous 3 times per day  . megestrol  200 mg Oral Daily  . nicotine  21 mg Transdermal Daily  . nystatin  5 mL Oral QID  . oxyCODONE  15 mg Oral Q12H  . QUEtiapine  25 mg Oral QHS  . senna  2 tablet Oral Daily     Electrolyte/Renal Profile and Glucose Profile:   Recent Labs Lab 02/25/15 1612 02/27/15 0548 03/01/15 1032  NA 143 148* 142  K 3.4* 3.6 3.8  CL 103 112* 104  CO2 32 28 30  BUN 7 6 <5*  CREATININE 0.80 0.71 0.88  CALCIUM 9.7 9.6 9.6  GLUCOSE 103* 103* 128*   Protein Profile:   Recent Labs Lab 02/25/15 1612  ALBUMIN 3.1*    Weight Trend since Admission: Filed Weights   02/25/15 2000 02/26/15 0532  Weight: 138 lb 6.4 oz (62.778 kg) 140 lb (63.504 kg)     BMI:  Body mass index is 24.02 kg/(m^2).  Estimated Nutritional Needs:   Kcal:  BEE: 1170kcals, TEE: (IF 1.1-1.3)(AF 1.2) 1544-1825kcals  Protein:  69-82g protein (1.1-1.3g/kg)  Fluid:  1575-1829m of fluid (25-374mkg)  EDUCATION NEEDS:   No education needs identified at this time   HIWaldronRD, LDN Pager [phone]

## 2015-03-02 NOTE — Progress Notes (Signed)
Patient now agreeable to chemotherapy. Power port placement requested. Procedure reviewed, risks (pneuomthorax) discussed. Patient is right handed. Will schedule for tomorrow, will be completed by [facility] Surgery staff.

## 2015-03-03 ENCOUNTER — Inpatient Hospital Stay: Payer: Medicaid Other

## 2015-03-03 ENCOUNTER — Encounter: Payer: Self-pay | Admitting: *Deleted

## 2015-03-03 ENCOUNTER — Encounter: Payer: Self-pay | Admitting: Surgery

## 2015-03-03 ENCOUNTER — Inpatient Hospital Stay: Payer: Self-pay

## 2015-03-03 ENCOUNTER — Encounter
Admission: AD | Disposition: A | Discharge status: Hospice | Payer: Self-pay | Source: Ambulatory Visit | Attending: Internal Medicine

## 2015-03-03 HISTORY — PX: PORTACATH PLACEMENT: SHX2246

## 2015-03-03 LAB — CBC
HCT: 33.5 % — ABNORMAL LOW (ref 35.0–47.0)
HEMOGLOBIN: 11.2 g/dL — AB (ref 12.0–16.0)
MCH: 27.9 pg (ref 26.0–34.0)
MCHC: 33.3 g/dL (ref 32.0–36.0)
MCV: 83.6 fL (ref 80.0–100.0)
PLATELETS: 161 10*3/uL (ref 150–440)
RBC: 4.01 MIL/uL (ref 3.80–5.20)
RDW: 19 % — AB (ref 11.5–14.5)
WBC: 5.7 10*3/uL (ref 3.6–11.0)

## 2015-03-03 SURGERY — INSERTION, TUNNELED CENTRAL VENOUS DEVICE, WITH PORT
Anesthesia: General

## 2015-03-03 MED ORDER — FENTANYL CITRATE (PF) 100 MCG/2ML IJ SOLN
INTRAMUSCULAR | Status: DC | PRN
  Administered 2015-03-03: 50 ug via INTRAVENOUS

## 2015-03-03 MED ORDER — BUPIVACAINE-EPINEPHRINE (PF) 0.25% -1:200000 IJ SOLN
INTRAMUSCULAR | Status: DC | PRN
  Administered 2015-03-03: 14 mL

## 2015-03-03 MED ORDER — BUPIVACAINE-EPINEPHRINE (PF) 0.25% -1:200000 IJ SOLN
INTRAMUSCULAR | Status: AC
  Filled 2015-03-03: qty 30

## 2015-03-03 MED ORDER — MIDAZOLAM HCL 2 MG/2ML IJ SOLN
INTRAMUSCULAR | Status: DC | PRN
  Administered 2015-03-03: 1 mg via INTRAVENOUS

## 2015-03-03 MED ORDER — CEFAZOLIN SODIUM-DEXTROSE 2-3 GM-% IV SOLR
2.0000 g | INTRAVENOUS | Status: AC
  Administered 2015-03-03: 2 g via INTRAVENOUS
  Filled 2015-03-03: qty 50

## 2015-03-03 MED ORDER — SODIUM CHLORIDE 0.9 % IJ SOLN
INTRAMUSCULAR | Status: AC
  Filled 2015-03-03: qty 50

## 2015-03-03 MED ORDER — HEPARIN SODIUM (PORCINE) 5000 UNIT/ML IJ SOLN
INTRAMUSCULAR | Status: AC
  Filled 2015-03-03: qty 1

## 2015-03-03 MED ORDER — PROPOFOL 500 MG/50ML IV EMUL
INTRAVENOUS | Status: DC | PRN
  Administered 2015-03-03: 100 ug/kg/min via INTRAVENOUS

## 2015-03-03 MED ORDER — HYDROMORPHONE HCL 1 MG/ML IJ SOLN: 0.2500 mg | INTRAMUSCULAR | Status: DC | PRN

## 2015-03-03 MED ORDER — LACTATED RINGERS IV SOLN
INTRAVENOUS | Status: DC | PRN
  Administered 2015-03-03: 11:00:00 via INTRAVENOUS

## 2015-03-03 MED ORDER — ONDANSETRON HCL 4 MG/2ML IJ SOLN: 4.0000 mg | Freq: Once | INTRAMUSCULAR | Status: DC | PRN

## 2015-03-03 MED ORDER — HEPARIN SODIUM (PORCINE) 1000 UNIT/ML IJ SOLN
INTRAMUSCULAR | Status: DC | PRN
  Administered 2015-03-03: 15 mL via INTRAMUSCULAR

## 2015-03-03 SURGICAL SUPPLY — 25 items
ADHESIVE MASTISOL STRL (MISCELLANEOUS) ×3 IMPLANT
CANISTER SUCT 1200ML W/VALVE (MISCELLANEOUS) ×3 IMPLANT
CHLORAPREP W/TINT 26ML (MISCELLANEOUS) ×3 IMPLANT
CLOSURE WOUND 1/2 X4 (GAUZE/BANDAGES/DRESSINGS) ×1
COVER LIGHT HANDLE STERIS (MISCELLANEOUS) ×6 IMPLANT
DRAPE C-ARM XRAY 36X54 (DRAPES) ×3 IMPLANT
GAUZE SPONGE 4X4 12PLY STRL (GAUZE/BANDAGES/DRESSINGS) ×3 IMPLANT
GAUZE SPONGE NON-WVN 2X2 STRL (MISCELLANEOUS) ×1 IMPLANT
GLOVE BIO SURGEON STRL SZ8 (GLOVE) ×6 IMPLANT
GOWN STRL REUS W/ TWL LRG LVL3 (GOWN DISPOSABLE) ×2 IMPLANT
GOWN STRL REUS W/TWL LRG LVL3 (GOWN DISPOSABLE) ×4
KIT RM TURNOVER STRD PROC AR (KITS) ×3 IMPLANT
LABEL OR SOLS (LABEL) ×3 IMPLANT
NEEDLE FILTER BLUNT 18X 1/2SAF (NEEDLE) ×2
NEEDLE FILTER BLUNT 18X1 1/2 (NEEDLE) ×1 IMPLANT
PACK PORT-A-CATH (MISCELLANEOUS) ×3 IMPLANT
PAD GROUND ADULT SPLIT (MISCELLANEOUS) ×3 IMPLANT
PORTACATH POWER 8F (Port) ×3 IMPLANT
SPONGE VERSALON 2X2 STRL (MISCELLANEOUS) ×2
STRIP CLOSURE SKIN 1/2X4 (GAUZE/BANDAGES/DRESSINGS) ×2 IMPLANT
SUT MNCRL AB 4-0 PS2 18 (SUTURE) ×3 IMPLANT
SUT PROLENE 3 0 SH DA (SUTURE) ×3 IMPLANT
SUT VIC AB 3-0 SH 27 (SUTURE) ×2
SUT VIC AB 3-0 SH 27X BRD (SUTURE) ×1 IMPLANT
SYR 3ML LL SCALE MARK (SYRINGE) ×3 IMPLANT

## 2015-03-03 NOTE — Progress Notes (Signed)
[place] at [place] NAME: Brandy Odom    MR#:  [id number]  DATE OF BIRTH:  09-01-1950  SUBJECTIVE: 64 year old female patient admitted for intractable bone pains and poor by mouth intake. Recently diagnosed metastatic lung cancer. It is post port placement today.   CHIEF COMPLAINT:  No chief complaint on file.   REVIEW OF SYSTEMS:   ROS CONSTITUTIONAL: No fever, has fatigue and generalized weakness. EARS, NOSE, AND THROAT: No tinnitus or ear pain. Difficulty swallowing. RESPIRATORY: No cough, shortness of breath, wheezing or hemoptysis.  CARDIOVASCULAR: No chest pain, orthopnea, edema.  GASTROINTESTINAL: Had mild abdominal pain left lower quadrant. No nausea, no vomiting. GENITOURINARY: No dysuria, hematuria.  ENDOCRINE: No polyuria, nocturia,  HEMATOLOGY: No anemia, easy bruising or bleeding SKIN: No rash or lesion. MUSCULOSKELETAL: Back pain issues.  NEUROLOGIC: No tingling, numbness, weakness.  PSYCHIATRY: Anxiety and panic attacks. Wants to talk to her son who lives in [place].  DRUG ALLERGIES:   Allergies  Allergen Reactions  . Bee Venom Anaphylaxis  . Naproxen Nausea And Vomiting  . Sulfa Antibiotics Other (See Comments)    Reaction:  Unknown   . Antihistamines, Chlorpheniramine-Type Palpitations    VITALS:  Blood pressure 137/74, pulse 93, temperature 97.8 F (36.6 C), temperature source Oral, resp. rate 20, height '5\' 4"'$  (1.626 m), weight 63.504 kg (140 lb), SpO2 93 %.  PHYSICAL EXAMINATION:  GENERAL:  64 y.o.-year-old patient lying in the bed with no acute distress.  EYES: Pupils equal, round, reactive to light and accommodation. No scleral icterus. Extraocular muscles intact.  HEENT: Head atraumatic, normocephalic. Oropharynx and nasopharynx clear.  NECK:  Supple, no jugular venous distention. No thyroid enlargement, no tenderness.  LUNGS: Normal breath sounds bilaterally, no wheezing, rales,rhonchi or  crepitation. No use of accessory muscles of respiration.  CARDIOVASCULAR: S1, S2 normal. No murmurs, rubs, or gallops.  ABDOMEN: Soft, nontender, nondistended. Bowel sounds present. No organomegaly or mass.  EXTREMITIES: No pedal edema, cyanosis, or clubbing.  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  Sensation intact. Gait not checked.  PSYCHIATRIC: The patient is alert and oriented x 3.  SKIN: No obvious rash, lesion, or ulcer.    LABORATORY PANEL:   CBC  Recent Labs Lab 03/03/15 0507  WBC 5.7  HGB 11.2*  HCT 33.5*  PLT 161   ------------------------------------------------------------------------------------------------------------------  Chemistries   Recent Labs Lab 02/25/15 1612  03/01/15 1032  NA 143  < > 142  K 3.4*  < > 3.8  CL 103  < > 104  CO2 32  < > 30  GLUCOSE 103*  < > 128*  BUN 7  < > <5*  CREATININE 0.80  < > 0.88  CALCIUM 9.7  < > 9.6  AST 33  --   --   ALT 41  --   --   ALKPHOS 173*  --   --   BILITOT 0.8  --   --   < > = values in this interval not displayed. ------------------------------------------------------------------------------------------------------------------  Cardiac Enzymes No results for input(s): TROPONINI in the last 168 hours. ------------------------------------------------------------------------------------------------------------------  RADIOLOGY:  Dg Chest Port 1 View  03/03/2015  CLINICAL DATA:  Port-A-Cath placement. EXAM: PORTABLE CHEST 1 VIEW COMPARISON:  February 12, 2015. FINDINGS: Stable cardiomediastinal silhouette. No pneumothorax or pleural effusion is noted. Interval placement of left subclavian Port-A-Cath with distal tip at the expected position the cavoatrial junction. No acute pulmonary disease is noted. Bony thorax is intact. IMPRESSION: Interval placement of left subclavian  Port-A-Cath with distal tip at expected position of cavoatrial junction. No pneumothorax is noted. Electronically Signed   By: Marijo Conception, M.D.   On: 03/03/2015 12:30   Dg C-arm 1-60 Min-no Report  03/03/2015  CLINICAL DATA: port placement C-ARM 1-60 MINUTES Fluoroscopy was utilized by the requesting physician.  No radiographic interpretation.    EKG:   Orders placed or performed during the hospital encounter of 02/12/15  . ED EKG  . ED EKG    ASSESSMENT AND PLAN:   Intractable back pain and whole body pain secondary to metastatic small cell lung cancer: Continue fentanyl patch, IV Dilaudid, OxyContin has been added on admission . Appreciate palliative care to see the patient, and manage the pain. #2 surgery contacted for Port-A-Cath placement, chemotherapy to be started soon and then radiation after the chemotherapy.  #3 Megace for appetite stimulation #4 dysphagia'; continue dysphagia diet.. 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 Prognosis is poor.; s/ post port placement today to start chemotherapy. #6. anxiety and depression due to new diagnosis of metastatic lung cancer: Started on Seroquel 25 mg at night. Appreciate psych follow-up. 7.anxiety increased the Valium dose. #8 hyponatremia secondary to dehydration increases the by mouth intake.  #9 severe malnutrition in the context of chronic illness: Continue  Boost Breeze,milk shakes  for dysphagia she is on mechanical soft diet with moist meats.   PROGNOSIS is poor CODE STATUS DO NOT RESUSCITATE  All the records are reviewed and case discussed with Care Management/Social Workerr. Management plans discussed with the patient, family and they are in agreement.  CODE STATUS: DO NOT RESUSCITATE  TOTAL TIME TAKING CARE OF THIS PATIENT:36 minutes.   POSSIBLE D/C IN  5-7DAYS, DEPENDING ON CLINICAL CONDITION.   Epifanio Lesches M.D on 03/03/2015 at 1:48 PM  Between 7am to 6pm - Pager - [phone]  After 6pm go to [email or web address] - password [email or web address]  [place] [facility]  Office   [phone]  CC: Primary care physician; No PCP Per Patient   Note: This dictation was prepared with Dragon dictation along with smaller phrase technology. Any transcriptional errors that result from this process are unintentional.

## 2015-03-03 NOTE — Plan of Care (Signed)
Problem: Safety: Goal: Ability to remain free from injury will improve Outcome: Progressing Pt remains from injury.  Fall precautions in place.  Pt calls appropriately with needs.     Problem: Pain Managment: Goal: General experience of comfort will improve Outcome: Progressing Pain meds given scheduled and as needed to manage pain.

## 2015-03-03 NOTE — Progress Notes (Signed)
CC: Metastatic cancer Subjective: Patient has no problems this morning. No nausea or vomiting. She has been nothing by mouth for anticipated port placement. She is requesting port placement today.  Objective: Vital signs in last 24 hours: Temp:  [97.8 F (36.6 C)-98 F (36.7 C)] 97.8 F (36.6 C) (12/14 0450) Pulse Rate:  [90-93] 93 (12/14 0450) Resp:  [17-20] 18 (12/14 0450) BP: (109-143)/(73-101) 121/74 mmHg (12/14 0450) SpO2:  [94 %-97 %] 96 % (12/14 0450) Last BM Date: 03/02/15  Intake/Output from previous day:   Intake/Output this shift:    Physical exam:  Awake alert oriented CT a  RR non tender calves   Lab Results: CBC   Recent Labs  03/03/15 0507  WBC 5.7  HGB 11.2*  HCT 33.5*  PLT 161   BMET  Recent Labs  03/01/15 1032  NA 142  K 3.8  CL 104  CO2 30  GLUCOSE 128*  BUN <5*  CREATININE 0.88  CALCIUM 9.6   PT/INR No results for input(s): LABPROT, INR in the last 72 hours. ABG No results for input(s): PHART, HCO3 in the last 72 hours.  Invalid input(s): PCO2, PO2  Studies/Results: No results found.  Anti-infectives: Anti-infectives    Start     Dose/Rate Route Frequency Ordered Stop   03/03/15 0837  ceFAZolin (ANCEF) IVPB 2 g/50 mL premix     2 g 100 mL/hr over 30 Minutes Intravenous 30 min pre-op 03/03/15 1191        Assessment/Plan:  Patient requesting port placement we discussed previously the options and rationale and risks in detail. She had wanted to wait but is now ready for port placement. I discussed with her the rationale for offering port placement and the options of observation PICC line or peripheral IV was also discussed risks of bleeding infection thrombosis nonfunction breakage pneumothorax pneumothorax any of which could require further surgery including chest tube or thoracotomy she understood and agreed to proceed.  Florene Glen, MD, FACS  03/03/2015

## 2015-03-03 NOTE — Anesthesia Procedure Notes (Signed)
Date/Time: 03/03/2015 10:53 AM Performed by: Johnna Acosta Pre-anesthesia Checklist: Patient identified, Emergency Drugs available, Suction available and Patient being monitored Patient Re-evaluated:Patient Re-evaluated prior to inductionOxygen Delivery Method: Simple face mask

## 2015-03-03 NOTE — Anesthesia Preprocedure Evaluation (Addendum)
Anesthesia Evaluation  Patient identified by MRN, date of birth, ID band Patient awake    Reviewed: Allergy & Precautions, NPO status , Patient's Chart, lab work & pertinent test results  Airway Mallampati: III  TM Distance: >3 FB Neck ROM: Limited    Dental  (+) Upper Dentures   Pulmonary shortness of breath and with exertion, Current Smoker,  Smoker with small cell lung cancer.   Pulmonary exam normal        Cardiovascular Exercise Tolerance: Poor Normal cardiovascular exam     Neuro/Psych    GI/Hepatic   Endo/Other    Renal/GU      Musculoskeletal   Abdominal (+)  Abdomen: soft.    Peds  Hematology   Anesthesia Other Findings   Reproductive/Obstetrics                            Anesthesia Physical Anesthesia Plan  ASA: IV  Anesthesia Plan: General   Post-op Pain Management:    Induction: Intravenous  Airway Management Planned: Simple Face Mask  Additional Equipment:   Intra-op Plan:   Post-operative Plan:   Informed Consent: I have reviewed the patients History and Physical, chart, labs and discussed the procedure including the risks, benefits and alternatives for the proposed anesthesia with the patient or authorized representative who has indicated his/her understanding and acceptance.     Plan Discussed with: CRNA  Anesthesia Plan Comments: (TIVA and local.)        Anesthesia Quick Evaluation

## 2015-03-03 NOTE — Anesthesia Postprocedure Evaluation (Signed)
Anesthesia Post Note  Patient: Brandy Odom  Procedure(s) Performed: Procedure(s) (LRB): INSERTION PORT-A-CATH (N/A)  Patient location during evaluation: PACU Anesthesia Type: General Level of consciousness: awake Pain management: pain level controlled Vital Signs Assessment: post-procedure vital signs reviewed and stable Respiratory status: spontaneous breathing Cardiovascular status: blood pressure returned to baseline Postop Assessment: no headache Anesthetic complications: no    Last Vitals:  Filed Vitals:   03/03/15 0450 03/03/15 1037  BP: 121/74 135/75  Pulse: 93 81  Temp: 36.6 C 37.4 C  Resp: 18 20    Last Pain:  Filed Vitals:   03/03/15 1040  PainSc: 2                  Quinlynn Cuthbert M

## 2015-03-03 NOTE — Progress Notes (Signed)
Pt is showing interest in possibly starting chemo.  She is very anxious about what will happen to her grandson or how he will react to her death.  She is pondering over the end and what needs to be done.  She requires frequent visits with concerns and sometimes just to talk.

## 2015-03-03 NOTE — Transfer of Care (Signed)
Immediate Anesthesia Transfer of Care Note  Patient: Brandy Odom  Procedure(s) Performed: Procedure(s): INSERTION PORT-A-CATH (N/A)  Patient Location: PACU  Anesthesia Type:General  Level of Consciousness: sedated  Airway & Oxygen Therapy: Patient Spontanous Breathing and Patient connected to face mask oxygen  Post-op Assessment: Report given to RN and Post -op Vital signs reviewed and stable  Post vital signs: Reviewed and stable  Last Vitals:  Filed Vitals:   03/03/15 0450 03/03/15 1037  BP: 121/74 135/75  Pulse: 93 81  Temp: 36.6 C 37.4 C  Resp: 18 20    Complications: No apparent anesthesia complications

## 2015-03-03 NOTE — Progress Notes (Signed)
Speech Therapy Note: reviewed chart notes(no temp or elevated wbc, lungs clear per CXR today); consulted NSG re: pt's status today. NSG reported pt was swallowing pills w/ liquids appropriately w/ no overt s/s of aspiration. Pt has only been eating some jello at times; port placement today per NSG. PO intake has been minimal overall sec. To medical status, but no overt s/s of aspiration are noted. Pt is resting at this time. ST will f/u tomorrow w/ pt's swallowing status.

## 2015-03-03 NOTE — Op Note (Signed)
  Pre-operative Diagnosis: Metastatic cancer  Post-operative Diagnosis: Metastatic cancer   Surgeon: Delfino Lovett E. Burt Knack, MD FACS  Anesthesia: MAC with local  Procedure: Port placement with fluoroscopy  Procedure Details  The patient was seen again in the Holding Room. The benefits, complications, treatment options, and expected outcomes were discussed with the patient. The risks of bleeding, infection, recurrence of symptoms, failure to resolve symptoms,  thrombosis nonfunction breakage pneumothorax hemopneumothorax any of which could require chest tube or further surgery were reviewed with the patient.   The patient was taken to Operating Room, identified as Brandy Odom and the procedure verified.  A Time Out was held and the above information confirmed.  Prior to the induction of general anesthesia, antibiotic prophylaxis was administered. VTE prophylaxis was in place. Appropriate anesthesia was then administered and tolerated well. The chest was prepped with Chloraprep and draped in the sterile fashion. The patient was positioned in the supine position. Then the patient was placed in Trendelenburg position.  Patient was prepped and draped in sterile fashion and in a Trendelenburg position local anesthetic was infiltrated into the skin and subcutaneous tissues in the anterior chest wall. The large bore needle was placed into the subclavian vein without difficulty after 2 attempts failed to allow threading of the Seldinger wire. Ultimately the Seldinger wire was advanced. Fluoroscopy was utilized to confirm that the Seldinger wire was in the superior vena cava.  An incision was made and a port pocket developed with blunt and electrocautery dissection. The introducer dilator was placed over the Seldinger wire the wire was removed. The previously flushed catheter was placed into the introducer dilator and the peel-away sheath was removed. The catheter length was confirmed and trimmed utilizing  fluoroscopy for proper positioning. The catheter was then attached to the previously flushed port. The port was placed into the pocket. The port was held in with 3-0 Prolenes and flushed for function and heparin locked.  The wound was closed with interrupted 3-0 Vicryl followed by 4-0 subcuticular Monocryl sutures. Steri-Strips Mastisol and sterile dressings were placed  Patient was taken to the recovery room in stable condition where a postoperative chest film has been ordered.    Findings: Port placed in the subclavian position verified by fluoroscopy. A postoperative chest film has been ordered.  Estimated Blood Loss: Minimal         Drains: None         Specimens: None       Complications: None                  Condition: Stable   Richard E. Burt Knack, MD, FACS

## 2015-03-04 MED ORDER — ALPRAZOLAM 0.5 MG PO TABS: 0.5000 mg | ORAL_TABLET | Freq: Four times a day (QID) | ORAL | Status: DC | PRN

## 2015-03-04 NOTE — Progress Notes (Signed)
Nutrition Follow-up  DOCUMENTATION CODES:   Severe malnutrition in context of chronic illness  INTERVENTION:   Meals and Snacks: Cater to patient preferences, Await diet progression as pt on FL at this time. Pt reports tolerating cold foods well currently. Enjoyed strawberry milkshake from kitchen this am. Medical Food Supplement Therapy: will discontinue Boost Breeze as pt does not like. Will send Magic Cup lunch and dinner for added nutrition as pt on FL currently and is tolerating cold foods better. Coordination of Care: will request new weight    NUTRITION DIAGNOSIS:   Malnutrition related to cancer and cancer related treatments as evidenced by energy intake < or equal to 75% for > or equal to 1 month, percent weight loss, severe depletion of muscle mass.  GOAL:   Patient will meet greater than or equal to 90% of their needs  MONITOR:    (Energy Intake, Anthropometrics, Digestive System, Electrolyte and renal Profile)  REASON FOR ASSESSMENT:   Malnutrition Screening Tool    ASSESSMENT:   Pt admitted with intractible pain. Pt with recent diagnosis of small cell lung cancer with bony mets; oncology following.  Per MD note, pt s/p port placement for chemotherapy; per oncology no plans to receive chemotherapy while inpatient. MD note also reports possible discharge tomorrow.  Diet Order:  Diet full liquid Room service appropriate?: Yes; Fluid consistency:: Thin    Current Nutrition: Pt reports eating grits this am and 100% of strawberry milkshake, reports really enjoying it. Pt reports appetite is up and down. RD helped pt to open her Pepsi at bedside. Pt NPO some of yesterday for port placement.   Gastrointestinal Profile: Last BM: 03/02/2015   Scheduled Medications:  . fentaNYL  75 mcg Transdermal Q72H  . heparin  5,000 Units Subcutaneous 3 times per day  . megestrol  200 mg Oral Daily  . nicotine  21 mg Transdermal Daily  . nystatin  5 mL Oral QID  . oxyCODONE   15 mg Oral Q12H  . QUEtiapine  25 mg Oral QHS  . senna  2 tablet Oral Daily     Electrolyte/Renal Profile and Glucose Profile:   Recent Labs Lab 02/25/15 1612 02/27/15 0548 03/01/15 1032  NA 143 148* 142  K 3.4* 3.6 3.8  CL 103 112* 104  CO2 32 28 30  BUN 7 6 <5*  CREATININE 0.80 0.71 0.88  CALCIUM 9.7 9.6 9.6  GLUCOSE 103* 103* 128*   Protein Profile:  Recent Labs Lab 02/25/15 1612  ALBUMIN 3.1*      Weight Trend since Admission: Filed Weights   02/25/15 2000 02/26/15 0532  Weight: 138 lb 6.4 oz (62.778 kg) 140 lb (63.504 kg)    BMI:  Body mass index is 24.02 kg/(m^2).  Estimated Nutritional Needs:   Kcal:  BEE: 1170kcals, TEE: (IF 1.1-1.3)(AF 1.2) 1544-1825kcals  Protein:  69-82g protein (1.1-1.3g/kg)  Fluid:  1575-1867m of fluid (25-359mkg)  EDUCATION NEEDS:   No education needs identified at this time   MOCorderRD, LDN Pager ([phone]

## 2015-03-04 NOTE — Care Management (Addendum)
Brandy Odom has decided to proceed with palliative chemotherapy. Will be followed by [facility]. Flo Shanks, RN representative for [facility] updated.  Possible discharge tomorrow after hospital bed has been delivered. Will follow-up in Dr. Rober Minion office  for treatment plan. Shelbie Ammons RN MSN CCM Care Management [phone]

## 2015-03-04 NOTE — Plan of Care (Signed)
Likely pt will go home tomorrow w/hospice and on PO pain meds.  Pt's son is visiting from [place] tomorrow.  Per Hospice liason and palliative care dr - if pt has chemo, it will be for palliation. Pt's husband appears to be putting stress on her to start tx. Emphasized to him, this is her decision and he needs to support her.  Pt only had pain medicine 1x today.  C/o of blurred vision in R eye - seems to be protruding. Pt's state of mind seems good.  She remained safe during shift.  Able to let us know when she needs to go to the [facility]. Started on megace to try to stimulate appetite.

## 2015-03-04 NOTE — Progress Notes (Signed)
New referral for [facility] services at home after discharge received from [facility]. Ms. Roselli was originally referred for [facility] services after her last hospitalization but was rehospitalized prior to her admission to services. She is 64 year old woman with metastatic small cell lung cancer. Admitted to [facility] on 12/8 for pain and dehydration. She was to receive palliative brain radiation during this hospitalization but this has not been able to be initiated d/t patient vacillating back and forth about it. She had a left chest portacath placed on 12/14. This is to be used for palliative Chemo.This chemo is for the palliation of bone pain. Bone scan performed on 11/28 showed uptake in the calvarium, ribs, vertebral bodies, pelvis, bilateral femurs and shoulders. Her current pain regime is a fentanyl 75 mcg patch and OxyContin Q 12 hrs with dilaudid 2 mg q 3 hours for breakthrough. Patient has had many conversations with Palliative Care physician Dr. Megan Salon as well as her oncologist Dr. Mike Gip and now feels that she would like to return home with hospice services. She is a DNR with signed DNR form in place in her chart. Writer clarified  with Hospice Director of Nursing Brien Mates RN that patient would still qualify to receive hospice services and receive palliative chemo as this would be palliating the symptom of bone pain. Writer met with Ms. Cornell in her room to initiate education regarding hospice services, philosophy and team approach to care with good understanding voiced. She will need a hospital bed with 1/2 rails and a geo matt. She at this time is unable to predict when her husband will be home to receive the equipment, she requested that writer contact her husband directly to arrange. Mr. Fahringer was contacted at [phone], he was unable to commit to a time and requested that he be contacted tomorrow at 10 am, writer to do so. Information faxed to referral  intake.  Both Mrs. Boss and [facility] [name] made aware of plan for contact with Mr. Reininger to arrange delivery tomorrow. Patient may need EMS transport at discharge. Will continue to follow through final disposition. Flo Shanks Rn, BSN, [facility] [place], hospital Liaison [phone] c

## 2015-03-04 NOTE — Progress Notes (Signed)
Palliative Care Update   See full note from earlier today.    Pt has agreed to Hospice in the Home. She already selected [facility].     The logistics are to be worked out and this is a Tax adviser.  Pts husband agreed also (on the phone today in talking with Hospice Liaison).  However, it is a challenge to figure out when he can be home tomorrow for delivery of the hospital bed.  He is 'working 7 days a week' per pt.  But he has got to be there for delivery of the hospital bed.  Hospice Liaison is trying to work this out with him when he can be reached.    The patient is advised that any chemo to be done will need to be worked out between herself and the cancer center on an outpt basis.  I have confirmed this to be the case with Dr. Mike Gip also.  Colleen Can, MD

## 2015-03-04 NOTE — Progress Notes (Signed)
Patient doing very well at very well postoperatively. No complaints at this time. Vital signs are reviewed Wound around port is healing well no erythema no drainage nontender. Kidneys port at any time will follow up as needed.

## 2015-03-04 NOTE — Progress Notes (Signed)
[place] [facility] [place] NAME: Brandy Odom    MR#:  [id number]  DATE OF BIRTH:  09-Aug-1950  SUBJECTIVE: 64 year old female patient admitted for intractable bone pains and poor by mouth intake. Recently diagnosed metastatic lung cancer. It is post port placement . Patient is eager to talk to her oncologist for chemotherapy. getting progressively weaker.   CHIEF COMPLAINT:  No chief complaint on file.   REVIEW OF SYSTEMS:   ROS CONSTITUTIONAL: No fever, has fatigue and generalized weakness. EARS, NOSE, AND THROAT: No tinnitus or ear pain. Difficulty swallowing. RESPIRATORY: No cough, shortness of breath, wheezing or hemoptysis.  CARDIOVASCULAR: No chest pain, orthopnea, edema.  GASTROINTESTINAL: Had mild abdominal pain left lower quadrant. No nausea, no vomiting. GENITOURINARY: No dysuria, hematuria.  ENDOCRINE: No polyuria, nocturia,  HEMATOLOGY: No anemia, easy bruising or bleeding SKIN: No rash or lesion. MUSCULOSKELETAL: Back pain issues.  NEUROLOGIC: No tingling, numbness, weakness.  PSYCHIATRY: Anxiety and panic attacks. Wants to talk to her son who lives in [place].  DRUG ALLERGIES:   Allergies  Allergen Reactions  . Bee Venom Anaphylaxis  . Naproxen Nausea And Vomiting  . Sulfa Antibiotics Other (See Comments)    Reaction:  Unknown   . Antihistamines, Chlorpheniramine-Type Palpitations    VITALS:  Blood pressure 140/67, pulse 90, temperature 97.9 F (36.6 C), temperature source Axillary, resp. rate 20, height '5\' 4"'$  (1.626 m), weight 63.504 kg (140 lb), SpO2 97 %.  PHYSICAL EXAMINATION:  GENERAL:  64 y.o.-year-old patient lying in the bed with no acute distress.  EYES: Pupils equal, round, reactive to light and accommodation. No scleral icterus. Extraocular muscles intact.  HEENT: Head atraumatic, normocephalic. Oropharynx and nasopharynx clear.  NECK:  Supple, no jugular venous distention. No thyroid enlargement,  no tenderness.  LUNGS: Normal breath sounds bilaterally, no wheezing, rales,rhonchi or crepitation. No use of accessory muscles of respiration.  CARDIOVASCULAR: S1, S2 normal. No murmurs, rubs, or gallops.  ABDOMEN: Soft, nontender, nondistended. Bowel sounds present. No organomegaly or mass.  EXTREMITIES: No pedal edema, cyanosis, or clubbing.  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  Sensation intact. Gait not checked.  PSYCHIATRIC: The patient is alert and oriented x 3.  SKIN: No obvious rash, lesion, or ulcer.    LABORATORY PANEL:   CBC  Recent Labs Lab 03/03/15 0507  WBC 5.7  HGB 11.2*  HCT 33.5*  PLT 161   ------------------------------------------------------------------------------------------------------------------  Chemistries   Recent Labs Lab 02/25/15 1612  03/01/15 1032  NA 143  < > 142  K 3.4*  < > 3.8  CL 103  < > 104  CO2 32  < > 30  GLUCOSE 103*  < > 128*  BUN 7  < > <5*  CREATININE 0.80  < > 0.88  CALCIUM 9.7  < > 9.6  AST 33  --   --   ALT 41  --   --   ALKPHOS 173*  --   --   BILITOT 0.8  --   --   < > = values in this interval not displayed. ------------------------------------------------------------------------------------------------------------------  Cardiac Enzymes No results for input(s): TROPONINI in the last 168 hours. ------------------------------------------------------------------------------------------------------------------  RADIOLOGY:  Dg Chest Port 1 View  03/03/2015  CLINICAL DATA:  Central line placement. EXAM: PORTABLE CHEST - 1 VIEW COMPARISON:  One-view chest of the same day. FINDINGS: Heart size is normal. A left subclavian Port-A-Cath is stable in position. The tip is at the cavoatrial junction. The lungs are clear. Aeration  is improved. There is no effusion or edema to suggest failure. The visualized soft tissues and bony thorax are unremarkable. IMPRESSION: 1. Stable left subclavian Port-A-Cath. 2. No acute  cardiopulmonary disease. Electronically Signed   By: San Morelle M.D.   On: 03/03/2015 14:30   Dg Chest Port 1 View  03/03/2015  CLINICAL DATA:  Port-A-Cath placement. EXAM: PORTABLE CHEST 1 VIEW COMPARISON:  February 12, 2015. FINDINGS: Stable cardiomediastinal silhouette. No pneumothorax or pleural effusion is noted. Interval placement of left subclavian Port-A-Cath with distal tip at the expected position the cavoatrial junction. No acute pulmonary disease is noted. Bony thorax is intact. IMPRESSION: Interval placement of left subclavian Port-A-Cath with distal tip at expected position of cavoatrial junction. No pneumothorax is noted. Electronically Signed   By: Marijo Conception, M.D.   On: 03/03/2015 12:30   Dg C-arm 1-60 Min-no Report  03/03/2015  CLINICAL DATA: port placement C-ARM 1-60 MINUTES Fluoroscopy was utilized by the requesting physician.  No radiographic interpretation.    EKG:   Orders placed or performed during the hospital encounter of 02/12/15  . ED EKG  . ED EKG    ASSESSMENT AND PLAN:   Intractable back pain and whole body pain secondary to metastatic small cell lung cancer: Continue fentanyl patch, IV Dilaudid, OxyContin has been added on admission . #2, status post port placement, oncology to decide on chemotherapy.  #3 Megace for appetite stimulation #4 dysphagia'; continue dysphagia diet.. 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 Prognosis is poor.; s/ post port placement today to start chemotherapy. #6. anxiety and depression due to new diagnosis of metastatic lung cancer: Started on Seroquel 25 mg at night. Appreciate psych follow-up. 7.anxiety increased the Valium dose. #8 hyponatremia secondary to dehydration increases the by mouth intake.  #9 severe malnutrition in the context of chronic illness: Continue  Boost Breeze,milk shakes  for dysphagia she is on mechanical soft diet with moist meats.   PROGNOSIS is  poor CODE STATUS DO NOT RESUSCITATE Discharge her home with by mouth pain medications. Oncology is not planning to do further chemotherapy while she is here. Is that her son is visiting from [place] tomorrow and the would like to see if she can go home tomorrow morning. All the records are reviewed and case discussed with Care Management/Social Workerr. Management plans discussed with the patient, family and they are in agreement.  CODE STATUS: DO NOT RESUSCITATE  TOTAL TIME TAKING CARE OF THIS PATIENT:36 minutes.   POSSIBLE D/C IN  5-7DAYS, DEPENDING ON CLINICAL CONDITION.   Epifanio Lesches M.D on 03/04/2015 at 12:59 PM  Between 7am to 6pm - Pager - [phone]  After 6pm go to [email or web address] - password [email or web address] [facility]  [facility]  Office  [phone]  CC: Primary care physician; No PCP Per Patient   Note: This dictation was prepared with Dragon dictation along with smaller phrase technology. Any transcriptional errors that result from this process are unintentional.

## 2015-03-04 NOTE — Progress Notes (Addendum)
Palliative Medicine Inpatient Consult Follow Up Note   Name: Brandy Odom Date: 03/04/2015 MRN: [id number]  DOB: 10-05-1950  Referring Physician: Epifanio Lesches, MD  Palliative Care consult requested for this 64 y.o. female for goals of medical therapy in patient with metastatic small cell lung cancer.  TODAY'S DISCUSSIONS AND DECISIONS:   Husband is not here so I asked if she wants to do chemo for herself or to please her husband since this is his wish.  She says she has 'gone back and forth' but says she wants to 'try to beat this thing'.  She is reminded this is probably something to buy her more time --not a curative type of treatment.    I talked with her again about Hospice in the Home.  It would meet her needs better than anything else I can think of.  Home Health can provide a hospital bed, but she is more a candidate for in -home Hospice care than a home health candidate.  She could not sit up very easily without me helping her, but is able to take slow steps to the bathroom and maintain a sitting position and talk rationally today.    She has severe pain in her back and also in her legs today.  This is severe bone pain.  She is complaining if dizziness at times (and this is due to her brain mets no doubt).  She has diffuse bone mets, mediastinal adenopathy, bilateral pulmonary nodules, bilateral adrenal mets, a suspicious liver lesion and multiple brain mets.  She had a CT guided right iliac crest biopsy on 02/19/15 confirming metastatic small cell carcinoma of the lung.  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    I have asked Hospice Liaison if pt can have HOSPICE in the HOME (they already specified [facility] ).  Pt can get chemo to palliate patient's severe bone pain in back, hips, and elsewhere.  I have just spoken with Dr. Mike Gip who agrees that Home  with Hospice is the best plan --and then pt can go to cancer center and follow up POSSIBLE chemo (to be determined when pt follows up in person in the [place] --she would need to see their financial planner first also--in person, in the clinic).    Next step:  To see if pt agrees with Hospice in the Home (and her husband will have to agree also of course).  Anticipate DC tomorrow if she agrees as a hospital bed will need to be delivered and conversations have to be held that have not occurred yet.    IMPRESSION: Widely metastatic small cell lung cancer ---with diffuse severe bone pain and dizziness from brain mets Tobacco abuse  ---now wearing a patch Carcinoid Anxiety Depression --situational Weakness Cough  REVIEW OF SYSTEMS:  Coughing more.  Blames this on a milkshake she drank.   CODE STATUS: DNR   PAST MEDICAL HISTORY: Past Medical History  Diagnosis Date  . Tobacco abuse   . Muscle spasm   . Small cell lung cancer ([place])     Metastatic    PAST SURGICAL HISTORY:  Past Surgical History  Procedure Laterality Date  . Appendectomy    . Colonoscopy with propofol N/A 02/15/2015    Procedure: COLONOSCOPY WITH PROPOFOL;  Surgeon: Lucilla Lame, MD;  Location: [facility] ENDOSCOPY;  Service: Endoscopy;  Laterality: N/A;  . Portacath placement  N/A 03/03/2015    Procedure: INSERTION PORT-A-CATH;  Surgeon: Florene Glen, MD;  Location: [facility] ORS;  Service: General;  Laterality: N/A;    Vital Signs: BP 140/67 mmHg  Pulse 90  Temp(Src) 97.9 F (36.6 C) (Axillary)  Resp 20  Ht '5\' 4"'$  (1.626 m)  Wt 63.504 kg (140 lb)  BMI 24.02 kg/m2  SpO2 97% Filed Weights   02/25/15 2000 02/26/15 0532  Weight: 62.778 kg (138 lb 6.4 oz) 63.504 kg (140 lb)    Estimated body mass index is 24.02 kg/(m^2) as calculated from the following:   Height as of this encounter: '5\' 4"'$  (1.626 m).   Weight as of this encounter: 63.504 kg (140 lb).  PHYSICAL EXAM: Weak appearing Disheveled  hair Coughing --nonproductive She asked me to help her sit up on side of bed and I did help her She was able to maintain a seated position on side of bed while we talked Husband is not present EOMI OP clear Hrt rrr no mgr Lungs no rales  Abd soft nl BS Ext no mottling or cyanosis  LABS: CBC:    Component Value Date/Time   WBC 5.7 03/03/2015 0507   WBC 9.0 04/10/2012 1601   HGB 11.2* 03/03/2015 0507   HGB 16.0 04/10/2012 1601   HCT 33.5* 03/03/2015 0507   HCT 46.4 04/10/2012 1601   PLT 161 03/03/2015 0507   PLT 222 04/10/2012 1601   MCV 83.6 03/03/2015 0507   MCV 85 04/10/2012 1601   NEUTROABS 5.6 02/25/2015 1612   LYMPHSABS 0.9* 02/25/2015 1612   MONOABS 0.4 02/25/2015 1612   EOSABS 0.1 02/25/2015 1612   BASOSABS 0.0 02/25/2015 1612   Comprehensive Metabolic Panel:    Component Value Date/Time   NA 142 03/01/2015 1032   NA 138 04/10/2012 1601   K 3.8 03/01/2015 1032   K 3.9 04/10/2012 1601   CL 104 03/01/2015 1032   CL 106 04/10/2012 1601   CO2 30 03/01/2015 1032   CO2 27 04/10/2012 1601   BUN <5* 03/01/2015 1032   BUN 12 04/10/2012 1601   CREATININE 0.88 03/01/2015 1032   CREATININE 0.89 04/10/2012 1601   GLUCOSE 128* 03/01/2015 1032   GLUCOSE 97 04/10/2012 1601   CALCIUM 9.6 03/01/2015 1032   CALCIUM 9.7 04/10/2012 1601   AST 33 02/25/2015 1612   ALT 41 02/25/2015 1612   ALKPHOS 173* 02/25/2015 1612   BILITOT 0.8 02/25/2015 1612   PROT 6.8 02/25/2015 1612   ALBUMIN 3.1* 02/25/2015 1612     More than 50% of the visit was spent in counseling/coordination of care: YES  Time Spent: 45 min    Addendum:  Spent an additional 35 minutes with pt.    She has agreed to Hospice in the Home and had already selected [facility].    Any chemo arrangements are to be worked out between pt and the [employer] on an outpt basis.   Colleen Can, MD  Colleen Can, MD

## 2015-03-04 NOTE — Plan of Care (Signed)
Problem: Safety: Goal: Ability to remain free from injury will improve Outcome: Progressing Pt is alert and oriented, forgetful at times. Encouraged to call for assistance, impulsive at times. High Fall Risk, hourly rounding.   Problem: Pain Managment: Goal: General experience of comfort will improve Outcome: Progressing Pt c/o pain in chest, Dilaudid given with scheduled oxycodone. Continue to assess.

## 2015-03-05 ENCOUNTER — Telehealth: Payer: Self-pay | Admitting: *Deleted

## 2015-03-05 MED ORDER — BISACODYL 10 MG RE SUPP: 10.0000 mg | RECTAL | Status: AC | PRN

## 2015-03-05 MED ORDER — DIAZEPAM 10 MG PO TABS: 5.0000 mg | ORAL_TABLET | Freq: Three times a day (TID) | ORAL | Status: DC | PRN

## 2015-03-05 MED ORDER — OXYCODONE HCL ER 15 MG PO T12A: 15.0000 mg | EXTENDED_RELEASE_TABLET | Freq: Two times a day (BID) | ORAL | Status: DC

## 2015-03-05 MED ORDER — FENTANYL 75 MCG/HR TD PT72: 75.0000 ug | MEDICATED_PATCH | TRANSDERMAL | Status: DC

## 2015-03-05 MED ORDER — SENNA 8.6 MG PO TABS: 2.0000 | ORAL_TABLET | Freq: Every day | ORAL | Status: AC

## 2015-03-05 MED ORDER — ACETAMINOPHEN 325 MG PO TABS: 650.0000 mg | ORAL_TABLET | ORAL | Status: AC | PRN

## 2015-03-05 MED ORDER — HYDROMORPHONE HCL 2 MG PO TABS: 1.0000 mg | ORAL_TABLET | ORAL | Status: DC | PRN

## 2015-03-05 MED ORDER — DEXAMETHASONE 4 MG PO TABS: 4.0000 mg | ORAL_TABLET | Freq: Two times a day (BID) | ORAL | Status: DC

## 2015-03-05 MED ORDER — MEGESTROL ACETATE 40 MG/ML PO SUSP: 200.0000 mg | Freq: Every day | ORAL | Status: DC

## 2015-03-05 MED ORDER — QUETIAPINE FUMARATE 25 MG PO TABS: 25.0000 mg | ORAL_TABLET | Freq: Every day | ORAL | Status: AC

## 2015-03-05 NOTE — Telephone Encounter (Signed)
Brandy Odom has agreed the [employer] will pay for a 30 day supply of Fentanyl 75 mcg #10, Diazepam 10 mg  #90, Dilaudid 2 mg # 240, Oxycodone 15 mg ER # 60.  I attempted to call patient, but did not get answer in her hospital room, so I spoke with Butch Penny her nurse who will relay message to her and that she has to get it from [facility] who closes at 6 tonight and will e open tomorrow until noon. I also called Neoma Laming at [facility] and let her know this is a one time deal and that should give them time to get her assistance for her next refill.

## 2015-03-05 NOTE — Telephone Encounter (Signed)
Called to states that pt is not going to be [facility] and wants Hospice services instead, asking if you will sign orders to be her attending for hospice

## 2015-03-05 NOTE — Progress Notes (Signed)
[facility] [place] NAME: Brandy Odom    MR#:  [id number]  DATE OF BIRTH:  01-24-1951  SUBJECTIVE: 64 year old female patient admitted for intractable bone pains and poor by mouth intake. Recently diagnosed metastatic lung cancer. It is post port placement . And to discharge her home with hospice tomorrow. Can consider chemotherapy in future for palliative chemotherapy to control bone pains. By mouth intake is better on Megace.   CHIEF COMPLAINT:  No chief complaint on file.   REVIEW OF SYSTEMS:   ROS CONSTITUTIONAL: No fever, has fatigue and generalized weakness. EARS, NOSE, AND THROAT: No tinnitus or ear pain. Difficulty swallowing. RESPIRATORY: No cough, shortness of breath, wheezing or hemoptysis.  CARDIOVASCULAR: No chest pain, orthopnea, edema.  GASTROINTESTINAL: Had mild abdominal pain left lower quadrant. No nausea, no vomiting. GENITOURINARY: No dysuria, hematuria.  ENDOCRINE: No polyuria, nocturia,  HEMATOLOGY: No anemia, easy bruising or bleeding SKIN: No rash or lesion. MUSCULOSKELETAL: Back pain issues.  NEUROLOGIC: No tingling, numbness, weakness.  PSYCHIATRY: Anxiety and panic attacks. Wants to talk to her son who lives in [place].  DRUG ALLERGIES:   Allergies  Allergen Reactions  . Bee Venom Anaphylaxis  . Naproxen Nausea And Vomiting  . Sulfa Antibiotics Other (See Comments)    Reaction:  Unknown   . Antihistamines, Chlorpheniramine-Type Palpitations    VITALS:  Blood pressure 134/71, pulse 107, temperature 98.3 F (36.8 C), temperature source Axillary, resp. rate 22, height '5\' 4"'$  (1.626 m), weight 63.504 kg (140 lb), SpO2 96 %.  PHYSICAL EXAMINATION:  GENERAL:  64 y.o.-year-old patient lying in the bed with no acute distress.  EYES: Pupils equal, round, reactive to light and accommodation. No scleral icterus. Extraocular muscles intact.  HEENT: Head atraumatic, normocephalic. Oropharynx and  nasopharynx clear.  NECK:  Supple, no jugular venous distention. No thyroid enlargement, no tenderness.  LUNGS: Normal breath sounds bilaterally, no wheezing, rales,rhonchi or crepitation. No use of accessory muscles of respiration.  CARDIOVASCULAR: S1, S2 normal. No murmurs, rubs, or gallops.  ABDOMEN: Soft, nontender, nondistended. Bowel sounds present. No organomegaly or mass.  EXTREMITIES: No pedal edema, cyanosis, or clubbing.  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  Sensation intact. Gait not checked.  PSYCHIATRIC: The patient is alert and oriented x 3.  SKIN: No obvious rash, lesion, or ulcer.    LABORATORY PANEL:   CBC  Recent Labs Lab 03/03/15 0507  WBC 5.7  HGB 11.2*  HCT 33.5*  PLT 161   ------------------------------------------------------------------------------------------------------------------  Chemistries   Recent Labs Lab 03/01/15 1032  NA 142  K 3.8  CL 104  CO2 30  GLUCOSE 128*  BUN <5*  CREATININE 0.88  CALCIUM 9.6   ------------------------------------------------------------------------------------------------------------------  Cardiac Enzymes No results for input(s): TROPONINI in the last 168 hours. ------------------------------------------------------------------------------------------------------------------  RADIOLOGY:  No results found.  EKG:   Orders placed or performed during the hospital encounter of 02/12/15  . ED EKG  . ED EKG    ASSESSMENT AND PLAN:   Intractable back pain and whole body pain secondary to metastatic small cell lung cancer: Continue fentanyl patch, IV Dilaudid, OxyContin has been added on admission . #2, status post port placement, can be used for IV morphine. Patient decided to go home with hospice tomorrow. Chemotherapy in future for palliative intent to alleviate  bone pains.  #3 Megace for appetite stimulation #4 dysphagia'; continue dysphagia diet.on nystatin for oral thrush.. #5 metastatic small  cell cancer with metastases to liver ,bone,adrenal. Prognosis is poor.;  s/ post port placement . #6. anxiety and depression due to new diagnosis of metastatic lung cancer: Started on Seroquel 25 mg at night. Appreciate psych follow-up. 7.anxiety increased the Valium dose. #8 hyponatremia secondary to dehydration increases the by mouth intake.  #9 severe malnutrition in the context of chronic illness: Continue  Boost Breeze,milk shakes  for dysphagia she is on mechanical soft diet with moist meats.   PROGNOSIS is poor CODE STATUS DO NOT RESUSCITATE Discharge her home with by mouth pain medications. Oncology is not planning to do further chemotherapy while she is here. Is that her son is visiting from [place] tomorrow and the would like to see if she can go home tomorrow morning. All the records are reviewed and case discussed with Care Management/Social Workerr. Management plans discussed with the patient, family and they are in agreement.  CODE STATUS: DO NOT RESUSCITATE  TOTAL TIME TAKING CARE OF THIS PATIENT:36 minutes.   POSSIBLE D/C IN  5-7DAYS, DEPENDING ON CLINICAL CONDITION.   Epifanio Lesches M.D on 03/05/2015 at 2:04 PM  Between 7am to 6pm - Pager - [phone]  After 6pm go to [email or web address] - password [email or web address] [facility]  [facility]  Office  [phone]  CC: Primary care physician; No PCP Per Patient   Note: This dictation was prepared with Dragon dictation along with smaller phrase technology. Any transcriptional errors that result from this process are unintentional.

## 2015-03-05 NOTE — Progress Notes (Signed)
Palliative Medicine Inpatient Consult Follow Up Note   Name: Brandy Odom Date: 03/05/2015 MRN: [id number]  DOB: 1950/09/28  Referring Physician: Epifanio Lesches, MD  Palliative Care consult requested for this 64 y.o. female for goals of medical therapy in patient with metastatic colon cancer newly diagnosed after she presented with a 20 lb wt loss over 3 mos time as well as a chronic cough and persistent shortness of breath. She has need of symptom management for pain and shortness of breath and dizziness from brain mets.   TODAY'S DISCUSSIONS AND DECISIONS: 1.  Pt will go home with Hospice on Saturday after hospital bed is delivered to her home today. 2.  I have learned that the pt will get some help with cost of pain medications for 30 days (she requires a lot to manage her pain--it is severe w/o these meds)  This must be picked up from [place] before 6 pm or Saturday before noon.  Nurse to relay to pt.   3.  I have discussed adding decadron to her DC meds with Dr. Vianne Bulls. She agrees (no edema seen on imaging but she has symptoms --and this is also an appetite stimulant). 4.  I have done DC meds with Dr. Governor Specking permission today in anticipation of DC home in the am with [facility] Home.    IMPRESSION: Widely metastatic small cell lung cancer ---with diffuse severe bone pain and dizziness from brain mets Tobacco abuse  ---now wearing a patch Carcinoid Anxiety Depression --situational Weakness Cough Decreased appetite     CODE STATUS: DNR   PAST MEDICAL HISTORY: Past Medical History  Diagnosis Date  . Tobacco abuse   . Muscle spasm   . Small cell lung cancer ([place])     Metastatic    PAST SURGICAL HISTORY:  Past Surgical History  Procedure Laterality Date  . Appendectomy    . Colonoscopy with propofol N/A 02/15/2015    Procedure: COLONOSCOPY WITH PROPOFOL;  Surgeon: Lucilla Lame, MD;  Location: [facility] ENDOSCOPY;  Service: Endoscopy;   Laterality: N/A;  . Portacath placement N/A 03/03/2015    Procedure: INSERTION PORT-A-CATH;  Surgeon: Florene Glen, MD;  Location: [facility] ORS;  Service: General;  Laterality: N/A;    Vital Signs: BP 134/71 mmHg  Pulse 107  Temp(Src) 98.3 F (36.8 C) (Axillary)  Resp 22  Ht '5\' 4"'$  (1.626 m)  Wt 63.504 kg (140 lb)  BMI 24.02 kg/m2  SpO2 96% Filed Weights   02/25/15 2000 02/26/15 0532  Weight: 62.778 kg (138 lb 6.4 oz) 63.504 kg (140 lb)    Estimated body mass index is 24.02 kg/(m^2) as calculated from the following:   Height as of this encounter: '5\' 4"'$  (1.626 m).   Weight as of this encounter: 63.504 kg (140 lb).  PHYSICAL EXAM: NAD Resting Skin warm and dry --no cyanosis or mottling Breath sounds regular no rattling   LABS: CBC:    Component Value Date/Time   WBC 5.7 03/03/2015 0507   WBC 9.0 04/10/2012 1601   HGB 11.2* 03/03/2015 0507   HGB 16.0 04/10/2012 1601   HCT 33.5* 03/03/2015 0507   HCT 46.4 04/10/2012 1601   PLT 161 03/03/2015 0507   PLT 222 04/10/2012 1601   MCV 83.6 03/03/2015 0507   MCV 85 04/10/2012 1601   NEUTROABS 5.6 02/25/2015 1612   LYMPHSABS 0.9* 02/25/2015 1612   MONOABS 0.4 02/25/2015 1612   EOSABS 0.1 02/25/2015 1612   BASOSABS 0.0 02/25/2015 1612  Comprehensive Metabolic Panel:    Component Value Date/Time   NA 142 03/01/2015 1032   NA 138 04/10/2012 1601   K 3.8 03/01/2015 1032   K 3.9 04/10/2012 1601   CL 104 03/01/2015 1032   CL 106 04/10/2012 1601   CO2 30 03/01/2015 1032   CO2 27 04/10/2012 1601   BUN <5* 03/01/2015 1032   BUN 12 04/10/2012 1601   CREATININE 0.88 03/01/2015 1032   CREATININE 0.89 04/10/2012 1601   GLUCOSE 128* 03/01/2015 1032   GLUCOSE 97 04/10/2012 1601   CALCIUM 9.6 03/01/2015 1032   CALCIUM 9.7 04/10/2012 1601   AST 33 02/25/2015 1612   ALT 41 02/25/2015 1612   ALKPHOS 173* 02/25/2015 1612   BILITOT 0.8 02/25/2015 1612   PROT 6.8 02/25/2015 1612   ALBUMIN 3.1* 02/25/2015 1612    More than  50% of the visit was spent in counseling/coordination of care: YES  Time Spent:  15 min

## 2015-03-05 NOTE — Telephone Encounter (Signed)
Per Dr Mike Gip, agrees to be attending, but there is an issue with patient and her lack of insurance and not able to purchase her pain meds.

## 2015-03-05 NOTE — Plan of Care (Signed)
Problem: Pain Managment: Goal: General experience of comfort will improve Outcome: Progressing Pt requiring pain meds for flank/chest pain.  stated that pain meds did help decrease her pain.

## 2015-03-05 NOTE — Telephone Encounter (Signed)
Spoke with Hospice who said that they will get SW involved if she truly cannot afford to buy her pain meds. I also spoke with Lavena Stanford here in cancer center who has agreed to pay for a one month supply of her pain meds

## 2015-03-05 NOTE — Progress Notes (Signed)
Follow up visit to new referral for [facility] services at home after discharge. Patient seen sitting up on the side of the bed, alert and oriented, reports she slept well last evening. She confirmed that she and her husband remain agreeable to hospice services, questions answered. Writer spoke with Mr. Buczek [phone]) to arrange delivery of the hospital bed today, he has requested delivery after 5 pm today as he is working. Writer informed Mr. Donate that Hospice would be covering the cost of equipment and supplies and that the medications would need to be provided in the way that he/the patient was paying for them previously, he voiced understanding and said he would get the needed medications. Question remains regarding transportation, patient has steps into her home and is needing assistance with ambulation here at the hospital. Dr. Megan Salon to adjust discharge medications to focus on symptom management. CMRN [name] made aware of planned bed delivery time. Thank you. Flo Shanks RN, BSN, [place] [facility] [place], [facility] [phone]

## 2015-03-05 NOTE — Progress Notes (Addendum)
Speech Therapy Note: reviewed chart notes(no decline in BS per MD); consulted NSG re: pt's status in the past 2 days. NSG reported pt is swallowing pills w/ liquids and eating "some" at her meals w/ no overt s/s of aspiration noted. CXR on 03/03/15 indicated no acute dis.; no temp or elevation in wbc. Pt is on room air. Pt is not indicating any s/s of dysphagia at this time. She has been seen by Psychiatry and is on valium; megace also - pt has poor po intake overall. She is d/t d/c home w/ husband at some point soon w/ Hospice services being discussed d/t medical status. ST services can be reconsulted if any change in status. NSG agreed.

## 2015-03-05 NOTE — Progress Notes (Signed)
[facility]  Date of admission:  02/25/2015  Inpatient day:  03/04/2015  Consulting physician:  Dr. Nicholes Mango   Chief Complaint: Brandy Odom is a 64 y.o. female with metastatic small cell lung cancer who was admitted with intractable diffuse bone pain and poor oral intake.  Subjective:  Feels "ok".  Pain controlled.  Past Medical History  Diagnosis Date  . Tobacco abuse   . Muscle spasm   . Small cell lung cancer [facility])     Metastatic    Past Surgical History  Procedure Laterality Date  . Appendectomy    . Colonoscopy with propofol N/A 02/15/2015    Procedure: COLONOSCOPY WITH PROPOFOL;  Surgeon: Lucilla Lame, MD;  Location: [facility] ENDOSCOPY;  Service: Endoscopy;  Laterality: N/A;  . Portacath placement N/A 03/03/2015    Procedure: INSERTION PORT-A-CATH;  Surgeon: Florene Glen, MD;  Location: [facility] ORS;  Service: General;  Laterality: N/A;    Family History  Problem Relation Age of Onset  . Colon cancer      Social History:  reports that she has been smoking Cigarettes.  She does not have any smokeless tobacco history on file. She reports that she does not drink alcohol or use illicit drugs.  She lives in [place] with her husband, [name].  She is alone today.   Allergies:  Allergies  Allergen Reactions  . Bee Venom Anaphylaxis  . Naproxen Nausea And Vomiting  . Sulfa Antibiotics Other (See Comments)    Reaction:  Unknown   . Antihistamines, Chlorpheniramine-Type Palpitations    Medications Prior to Admission  Medication Sig Dispense Refill  . diazepam (VALIUM) 5 MG tablet Take 1 tablet (5 mg total) by mouth every 8 (eight) hours as needed for muscle spasms. 20 tablet 0  . docusate sodium (COLACE) 100 MG capsule Take 1 capsule (100 mg total) by mouth 2 (two) times daily. 10 capsule 0  . fentaNYL (DURAGESIC - DOSED MCG/HR) 25 MCG/HR patch Place 1 patch (25 mcg total) onto the skin every 3 (three) days. 5 patch 0  . HYDROcodone-acetaminophen  (NORCO) 5-325 MG tablet Take 1 tablet by mouth every 4 (four) hours as needed for moderate pain. 20 tablet 0  . nicotine (NICODERM CQ - DOSED IN MG/24 HOURS) 21 mg/24hr patch Place 1 patch (21 mg total) onto the skin daily. 28 patch 0  . nystatin (MYCOSTATIN) 100000 UNIT/ML suspension Take 5 mLs (500,000 Units total) by mouth 4 (four) times daily. 60 mL 0    Review of Systems: GENERAL:  Fatigue.  No fever or sweats.  Weight loss. PERFORMANCE STATUS (ECOG):  2-3 HEENT:  Diplopia on right lateral gaze.  No runny nose, sore throat, mouth sores or tenderness. Lungs:  No shortness of breath or cough.  No hemoptysis. Cardiac:  Chest/rib pain ("ok").  No palpitations, orthopnea, or PND. GI:   Appetite, improved.  No nausea, vomiting, diarrhea,melena or hematochezia. GU:  No urgency, frequency, dysuria, or hematuria. Musculoskeletal: Diffuse bone pain (ribs, back, legs), improved.  No muscle tenderness. Extremities:  No swelling. Skin:  No rashes or skin changes. Neuro:  General weakness.  No headache, numbness or weakness, balance or coordination issues. Endocrine:  No diabetes, thyroid issues, hot flashes or night sweats. Psych:  Anxiety, improved.  Pain:  Bone pain, diffuse (controlled). Review of systems:  All other systems reviewed and found to be negative.  Physical Exam:  Blood pressure 106/67, pulse 91, temperature 97.7 F (36.5 C), temperature source Oral, resp. rate 18,  height '5\' 4"'$  (1.626 m), weight 140 lb (63.504 kg), SpO2 95 %.  GENERAL:  Chronically fatigued appearing woman sitting up on the medical unit in no acute distress. MENTAL STATUS:  Alert and oriented to person, place and time. HEAD:  Red shoulder length hair with dark roots.  Normocephalic, atraumatic, face symmetric, no Cushingoid features. EYES:  Brown eyes.  No conjunctivitis or scleral icterus. EXTREMITIES: No edema, no skin discoloration or tenderness.  NEUROLOGICAL:  Stable. PSYCH: Appropriate.   Results for  orders placed or performed during the hospital encounter of 02/25/15 (from the past 48 hour(s))  CBC     Status: Abnormal   Collection Time: 03/03/15  5:07 AM  Result Value Ref Range   WBC 5.7 3.6 - 11.0 K/uL   RBC 4.01 3.80 - 5.20 MIL/uL   Hemoglobin 11.2 (L) 12.0 - 16.0 g/dL   HCT 33.5 (L) 35.0 - 47.0 %   MCV 83.6 80.0 - 100.0 fL   MCH 27.9 26.0 - 34.0 pg   MCHC 33.3 32.0 - 36.0 g/dL   RDW 19.0 (H) 11.5 - 14.5 %   Platelets 161 150 - 440 K/uL   Dg Chest Port 1 View  03/03/2015  CLINICAL DATA:  Central line placement. EXAM: PORTABLE CHEST - 1 VIEW COMPARISON:  One-view chest of the same day. FINDINGS: Heart size is normal. A left subclavian Port-A-Cath is stable in position. The tip is at the cavoatrial junction. The lungs are clear. Aeration is improved. There is no effusion or edema to suggest failure. The visualized soft tissues and bony thorax are unremarkable. IMPRESSION: 1. Stable left subclavian Port-A-Cath. 2. No acute cardiopulmonary disease. Electronically Signed   By: San Morelle M.D.   On: 03/03/2015 14:30   Dg Chest Port 1 View  03/03/2015  CLINICAL DATA:  Port-A-Cath placement. EXAM: PORTABLE CHEST 1 VIEW COMPARISON:  February 12, 2015. FINDINGS: Stable cardiomediastinal silhouette. No pneumothorax or pleural effusion is noted. Interval placement of left subclavian Port-A-Cath with distal tip at the expected position the cavoatrial junction. No acute pulmonary disease is noted. Bony thorax is intact. IMPRESSION: Interval placement of left subclavian Port-A-Cath with distal tip at expected position of cavoatrial junction. No pneumothorax is noted. Electronically Signed   By: Marijo Conception, M.D.   On: 03/03/2015 12:30   Dg C-arm 1-60 Min-no Report  03/03/2015  CLINICAL DATA: port placement C-ARM 1-60 MINUTES Fluoroscopy was utilized by the requesting physician.  No radiographic interpretation.    Assessment:  The patient is a 64 y.o. woman with metastatic small cell  lung cancer. She has a greater than 50 pack year smoking history. She presented with a 28 pound weight loss over 2 months, nausea, constipation, diplopia on lateral gaze, and diffuse bone pain. Imaging studies reveal diffuse bone disease, mediastinal adenopathy, bilateral pulmonary nodules, bilateral adrenal metastasis, a suspicious liver lesion, and multiple brain metastasis.  CT guided right iliac crest biopsy on 02/19/2015 confirmed metastatic small cell carcinoma of the lung. CEA was 333.8 on 02/14/2015.  Colonoscopy on 02/15/2015 revealed a frond-like/villous partially obstructing non-circumferential mass in the cecum. 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 CEA was 333.8 on 02/14/2015.  Serologies revealed prior hepatitis B infection.  Symptomatically, her bone pain is controlled. Oral intake has increased on Megace.   Plan:   1.  Hematology/Oncolgy:  Discussed with patient metastatic small cell, chemotherapy, radiation, and hospice.  Patient would like to go home on hospice for now.  I discussed consideration of palliative radiation  in the future for worsening CNS symptoms.  She could receive palliative chemotherapy in the future for pain control.  I discussed follow-up in clinic after discharge home to assess how things are going and if she would like to pursue any palliative treatment (CNS radiation or chemotherapy).  At that time, she could follow-up with social work regarding finances.  2.  Psychiatry:  Appreciate consult.  Patient taking Valium 10 mg q 8 hours prn and Seroquel. 3.  Fluids/Electrolytes/Nutrition:  Sodium normal (142).  Increased appetite on Megace.  Swallowing study reviewed. 4.  Infectious disease:  Thrush resolved on Nystatin. 5.  Disposition:  Anticipate discharge home to hospice.  Please set up follow-up appointment in the oncology clinic in 1 week.   Thank you for allowing me to participate in TANEA MOGA 's care.  I will follow her closely with you  while hospitalized and after discharge in the outpatient department.   Lequita Asal, MD  03/04/2015

## 2015-03-05 NOTE — Plan of Care (Signed)
Problem: Safety: Goal: Ability to remain free from injury will improve Outcome: Progressing Pt stand by assist to bathroom.

## 2015-03-06 NOTE — Discharge Instructions (Signed)
°  DIET:  Regular diet  DISCHARGE CONDITION:  Serious  ACTIVITY:  Activity as tolerated  OXYGEN:  Home Oxygen: No.   Oxygen Delivery: room air  DISCHARGE LOCATION:  Home with Hospice   If you experience worsening of your admission symptoms, develop shortness of breath, life threatening emergency, suicidal or homicidal thoughts you must seek medical attention immediately by calling 911 or calling your MD immediately  if symptoms less severe.  You Must read complete instructions/literature along with all the possible adverse reactions/side effects for all the Medicines you take and that have been prescribed to you. Take any new Medicines after you have completely understood and accpet all the possible adverse reactions/side effects.   Please note  You were cared for by a hospitalist during your hospital stay. If you have any questions about your discharge medications or the care you received while you were in the hospital after you are discharged, you can call the unit and asked to speak with the hospitalist on call if the hospitalist that took care of you is not available. Once you are discharged, your primary care physician will handle any further medical issues. Please note that NO REFILLS for any discharge medications will be authorized once you are discharged, as it is imperative that you return to your primary care physician (or establish a relationship with a primary care physician if you do not have one) for your aftercare needs so that they can reassess your need for medications and monitor your lab values.

## 2015-03-06 NOTE — Discharge Summary (Signed)
[facility] [place] NAME: Brandy Odom    MR#:  [id number]  DATE OF BIRTH:  [birth year]  DATE OF ADMISSION:  02/25/2015 ADMITTING PHYSICIAN: Lequita Asal, MD  DATE OF DISCHARGE: 03/06/2015  PRIMARY CARE PHYSICIAN: No PCP Per Patient    ADMISSION DIAGNOSIS:  Small Cell Pain Dehydration d  DISCHARGE DIAGNOSIS:  Principal Problem:   Intractable pain Active Problems:   Metastasis (HCC)   Small cell lung cancer (HCC)   Dehydration   Adjustment disorder with mixed anxiety and depressed mood   SECONDARY DIAGNOSIS:   Past Medical History  Diagnosis Date  . Tobacco abuse   . Muscle spasm   . Small cell lung cancer ([place])     Metastatic    HOSPITAL COURSE:    #1  Intractable back pain and whole body pain: secondary to metastatic small cell lung cancer: Continue fentanyl patch, by mouth Dilaudid, OxyContin. Patient is going home with hospice care. She has prescriptions in hand. Arrangement has been made for obtaining 30 day supply through care management.  #2, status post port placement: Port will remain in place. She may receive palliative chemotherapy in future to alleviate bone pains.  #3 decreased appetite: Megace for appetite stimulation  #4 dysphagia continue dysphagia diet.on nystatin for oral thrush.. Also continue with dietary supplements Ensure, Breeze.  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 Prognosis is poor. Has chemotherapy port in place for potential palliative chemotherapy for bone pain. She will follow-up with oncology in 1 week.  #6. anxiety and depression due to new diagnosis of metastatic lung cancer: Started on Seroquel 25 mg at night. Continue Valium as needed   DISCHARGE CONDITIONS:   Prognosis is poor patient discharged home with hospice services  CONSULTS OBTAINED:  Treatment Team:  Lequita Asal, MD Gonzella Lex, MD  DRUG  ALLERGIES:   Allergies  Allergen Reactions  . Bee Venom Anaphylaxis  . Naproxen Nausea And Vomiting  . Sulfa Antibiotics Other (See Comments)    Reaction:  Unknown   . Antihistamines, Chlorpheniramine-Type Palpitations    DISCHARGE MEDICATIONS:   Current Discharge Medication List    START taking these medications   Details  acetaminophen (TYLENOL) 325 MG tablet Take 2 tablets (650 mg total) by mouth every 4 (four) hours as needed for mild pain, fever or headache (or Fever >/= 101).    bisacodyl (DULCOLAX) 10 MG suppository Place 1 suppository (10 mg total) rectally as needed for severe constipation. Qty: 6 suppository, Refills: 0    dexamethasone (DECADRON) 4 MG tablet Take 1 tablet (4 mg total) by mouth 2 (two) times daily with a meal. Qty: 30 tablet, Refills: 1    HYDROmorphone (DILAUDID) 2 MG tablet Take 0.5 tablets (1 mg total) by mouth every 3 (three) hours as needed for moderate pain or severe pain. Qty: 30 tablet, Refills: 0    megestrol (MEGACE) 40 MG/ML suspension Take 5 mLs (200 mg total) by mouth daily. Qty: 240 mL, Refills: 0   Associated Diagnoses: Anorexia    oxyCODONE (OXYCONTIN) 15 mg 12 hr tablet Take 1 tablet (15 mg total) by mouth every 12 (twelve) hours. Qty: 30 tablet, Refills: 0    QUEtiapine (SEROQUEL) 25 MG tablet Take 1 tablet (25 mg total) by mouth at bedtime. Qty: 15 tablet, Refills: 0    senna (SENOKOT) 8.6 MG TABS tablet Take 2 tablets (17.2 mg total) by mouth at bedtime. Qty:  30 each, Refills: 1      CONTINUE these medications which have CHANGED   Details  diazepam (VALIUM) 10 MG tablet Take 0.5-1 tablets (5-10 mg total) by mouth every 8 (eight) hours as needed for anxiety or muscle spasms. Qty: 30 tablet, Refills: 0    fentaNYL (DURAGESIC - DOSED MCG/HR) 75 MCG/HR Place 1 patch (75 mcg total) onto the skin every 3 (three) days. Qty: 5 patch, Refills: 0      CONTINUE these medications which have NOT CHANGED   Details  nicotine  (NICODERM CQ - DOSED IN MG/24 HOURS) 21 mg/24hr patch Place 1 patch (21 mg total) onto the skin daily. Qty: 28 patch, Refills: 0      STOP taking these medications     docusate sodium (COLACE) 100 MG capsule      fentaNYL (DURAGESIC - DOSED MCG/HR) 25 MCG/HR patch      HYDROcodone-acetaminophen (NORCO) 5-325 MG tablet      nystatin (MYCOSTATIN) 100000 UNIT/ML suspension          DISCHARGE INSTRUCTIONS:    Regular diet. Activity as tolerated. Home with hospice services. She will follow-up with oncology in 1 week  If you experience worsening of your admission symptoms, develop shortness of breath, life threatening emergency, suicidal or homicidal thoughts you must seek medical attention immediately by calling 911 or calling your MD immediately  if symptoms less severe.  You Must read complete instructions/literature along with all the possible adverse reactions/side effects for all the Medicines you take and that have been prescribed to you. Take any new Medicines after you have completely understood and accept all the possible adverse reactions/side effects.   Please note  You were cared for by a hospitalist during your hospital stay. If you have any questions about your discharge medications or the care you received while you were in the hospital after you are discharged, you can call the unit and asked to speak with the hospitalist on call if the hospitalist that took care of you is not available. Once you are discharged, your primary care physician will handle any further medical issues. Please note that NO REFILLS for any discharge medications will be authorized once you are discharged, as it is imperative that you return to your primary care physician (or establish a relationship with a primary care physician if you do not have one) for your aftercare needs so that they can reassess your need for medications and monitor your lab values.    Today   CHIEF COMPLAINT:  No chief  complaint on file.  Pain is controlled this morning  HISTORY OF PRESENT ILLNESS:  Brandy Odom is a 64 y.o. female who presents from oncology clinic for dehydration and intractable pain. Patient has recent diagnosis of metastatic cancer found on recent biopsy to be small cell lung cancer. She states that she was discharged home from the hospital after an admission which found this diagnosis, but was unable to manage her pain at home. She went to the oncology clinic today for follow-up appointment and to her pain and dehydration was sent for direct admission.  VITAL SIGNS:  Blood pressure 117/70, pulse 98, temperature 98.4 F (36.9 C), temperature source Oral, resp. rate 18, height '5\' 4"'$  (1.626 m), weight 63.504 kg (140 lb), SpO2 97 %.  I/O:  No intake or output data in the 24 hours ending 03/06/15 1313  PHYSICAL EXAMINATION:  GENERAL:  64 y.o.-year-old patient lying in the bed with no acute distress.  LUNGS:  Normal breath sounds bilaterally, no wheezing, rales,rhonchi or crepitation. No use of accessory muscles of respiration.  CARDIOVASCULAR: S1, S2 normal. No murmurs, rubs, or gallops.  ABDOMEN: Soft, non-tender, non-distended. Bowel sounds present. No organomegaly or mass.  EXTREMITIES: No pedal edema, cyanosis, or clubbing.  NEUROLOGIC: Cranial nerves II through XII are intact. Muscle strength 5/5 in all extremities. Sensation intact. Gait not checked.  PSYCHIATRIC: The patient is alert and oriented x 3.  SKIN: No obvious rash, lesion, or ulcer.   DATA REVIEW:   CBC  Recent Labs Lab 03/03/15 0507  WBC 5.7  HGB 11.2*  HCT 33.5*  PLT 161    Chemistries   Recent Labs Lab 03/01/15 1032  NA 142  K 3.8  CL 104  CO2 30  GLUCOSE 128*  BUN <5*  CREATININE 0.88  CALCIUM 9.6    Cardiac Enzymes No results for input(s): TROPONINI in the last 168 hours.  Microbiology Results  Results for orders placed or performed during the hospital encounter of 02/12/15  Culture,  blood (routine x 2)     Status: None   Collection Time: 02/12/15  3:54 PM  Result Value Ref Range Status   Specimen Description BLOOD RIGHT ARM  Final   Special Requests BOTTLES DRAWN AEROBIC AND ANAEROBIC  4CC  Final   Culture NO GROWTH 5 DAYS  Final   Report Status 02/17/2015 FINAL  Final  Urine culture     Status: None   Collection Time: 02/12/15  7:03 PM  Result Value Ref Range Status   Specimen Description URINE, RANDOM  Final   Special Requests NONE  Final   Culture   Final    40,000 COLONIES/ml METHICILLIN RESISTANT STAPHYLOCOCCUS AUREUS MALKA SINWANY,RN 02/14/2015 1212 BY JRS. MIXED BACTERIAL ORGANISMS    Report Status 02/14/2015 FINAL  Final   Organism ID, Bacteria METHICILLIN RESISTANT STAPHYLOCOCCUS AUREUS  Final      Susceptibility   Methicillin resistant staphylococcus aureus - MIC*    CIPROFLOXACIN >=8 RESISTANT Resistant     ERYTHROMYCIN >=8 RESISTANT Resistant     GENTAMICIN <=0.5 SENSITIVE Sensitive     OXACILLIN >=4 RESISTANT Resistant     VANCOMYCIN 1 SENSITIVE Sensitive     TRIMETH/SULFA <=10 SENSITIVE Sensitive     CLINDAMYCIN <=0.25 RESISTANT Resistant     CEFOXITIN SCREEN Value in next row Resistant      POSITIVECEFOXITIN SCREEN - This test may be used to predict mecA-mediated oxacillin resistance, and it is based on the cefoxitin disk screen test.  The cefoxitin screen and oxacillin work in combination to determine the final interpretation reported for oxacillin.     Inducible Clindamycin Value in next row Resistant      POSITIVEINDUCIBLE CLINDAMYCIN RESISTANCE - A positive ICR test is indicative of inducible resistance to macrolides, lincosamides, and type B streptogramin.  This isolate is presumed to be resistant to Clindamycin, however, Clindamycin may still be effective in some patients.     * 40,000 COLONIES/ml METHICILLIN RESISTANT STAPHYLOCOCCUS AUREUS    RADIOLOGY:  No results found.  EKG:   Orders placed or performed during the hospital  encounter of 02/12/15  . ED EKG  . ED EKG      Management plans discussed with the patient, family and they are in agreement.  CODE STATUS:     Code Status Orders        Start     Ordered   02/25/15 1935  Do not attempt resuscitation (DNR)   Continuous  Question Answer Comment  In the event of cardiac or respiratory ARREST Do not call a "code blue"   In the event of cardiac or respiratory ARREST Do not perform Intubation, CPR, defibrillation or ACLS   In the event of cardiac or respiratory ARREST Use medication by any route, position, wound care, and other measures to relive pain and suffering. May use oxygen, suction and manual treatment of airway obstruction as needed for comfort.   Comments Discussed with the patient and her husband      02/25/15 1935      TOTAL TIME TAKING CARE OF THIS PATIENT: 35 minutes.  Greater than 50% of time spent in care coordination and counseling.  Myrtis Ser M.D on 03/06/2015 at 1:13 PM  Between 7am to 6pm - Pager - [phone]  After 6pm go to [email or web address] - password [email or web address] [facility]  [facility]  Office  [phone]  CC: Primary care physician; No PCP Per Patient

## 2015-03-06 NOTE — Care Management Note (Signed)
Case Management Note  Patient Details  Name: Brandy Odom MRN: [id number] Date of Birth: [birth year]  Subjective/Objective:      Call to Mr Whaling reporting that [name] prescriptions are in her hospital chart and that he needs to pick them up at [facility] to take to [facility] to get filled today before noon. The [place] is paying for the first 30 days of meds from [place] per [facility] note. [name] hospital bed has been delivered to her home per Mr Cheyney. [name] at [facility] updated. All discharge information faxed to [facility]. [name] nurse Cherlyn Labella is getting [name] ready for discharge.    Action/Plan:   Expected Discharge Date:                  Expected Discharge Plan:     In-House Referral:     Discharge planning Services     Post Acute Care Choice:    Choice offered to:     DME Arranged:    DME Agency:     HH Arranged:    [place] Agency:     Status of Service:     Medicare Important Message Given:    Date Medicare IM Given:    Medicare IM give by:    Date Additional Medicare IM Given:    Additional Medicare Important Message give by:     If discussed at [place] of Stay Meetings, dates discussed:    Additional Comments:  Kahiau Schewe A, RN 03/06/2015, 9:04 AM

## 2015-03-06 NOTE — Progress Notes (Signed)
VSS. Dilaudid given for L arm pain and back pain with improvement. Pt is discharged home with hospice. Discharge instructions given and explained to pt. Educated pt about discharge meds and pain management. Pt verbalized understanding. Follow up appointment reviewed with pt. Prescriptions given.

## 2015-03-08 ENCOUNTER — Telehealth: Payer: Self-pay | Admitting: *Deleted

## 2015-03-08 NOTE — Telephone Encounter (Signed)
Wants to come in to discuss treatment options with Dr Mike Gip. Patient and husband confused as to why Hospice was called in since she wants to live. Appt for tomorrow at 9 given to them

## 2015-03-09 ENCOUNTER — Inpatient Hospital Stay: Payer: Medicaid Other | Admitting: Hematology and Oncology

## 2015-03-11 ENCOUNTER — Ambulatory Visit
Admission: RE | Admit: 2015-03-11 | Discharge: 2015-03-11 | Disposition: A | Discharge status: Home / Self Care | Payer: Medicaid Other | Source: Ambulatory Visit | Attending: Radiation Oncology | Admitting: Radiation Oncology

## 2015-03-11 ENCOUNTER — Inpatient Hospital Stay (HOSPITAL_BASED_OUTPATIENT_CLINIC_OR_DEPARTMENT_OTHER): Payer: Medicaid Other | Admitting: Hematology and Oncology

## 2015-03-11 ENCOUNTER — Encounter: Payer: Self-pay | Admitting: Radiation Oncology

## 2015-03-11 ENCOUNTER — Encounter: Payer: Self-pay | Admitting: Hematology and Oncology

## 2015-03-11 VITALS — BP 115/77 | HR 103 | Temp 97.3°F | Resp 18 | Ht 64.0 in | Wt 138.9 lb

## 2015-03-11 DIAGNOSIS — Z79899 Other long term (current) drug therapy: Secondary | ICD-10-CM

## 2015-03-11 DIAGNOSIS — C7951 Secondary malignant neoplasm of bone: Secondary | ICD-10-CM | POA: Diagnosis not present

## 2015-03-11 DIAGNOSIS — Z510 Encounter for antineoplastic radiation therapy: Secondary | ICD-10-CM | POA: Diagnosis not present

## 2015-03-11 DIAGNOSIS — C180 Malignant neoplasm of cecum: Secondary | ICD-10-CM | POA: Diagnosis not present

## 2015-03-11 DIAGNOSIS — C7931 Secondary malignant neoplasm of brain: Secondary | ICD-10-CM

## 2015-03-11 DIAGNOSIS — H0589 Other disorders of orbit: Secondary | ICD-10-CM

## 2015-03-11 DIAGNOSIS — C772 Secondary and unspecified malignant neoplasm of intra-abdominal lymph nodes: Secondary | ICD-10-CM | POA: Diagnosis not present

## 2015-03-11 DIAGNOSIS — C7800 Secondary malignant neoplasm of unspecified lung: Secondary | ICD-10-CM

## 2015-03-11 DIAGNOSIS — C7A021 Malignant carcinoid tumor of the cecum: Secondary | ICD-10-CM

## 2015-03-11 DIAGNOSIS — B370 Candidal stomatitis: Secondary | ICD-10-CM | POA: Insufficient documentation

## 2015-03-11 DIAGNOSIS — C7972 Secondary malignant neoplasm of left adrenal gland: Secondary | ICD-10-CM | POA: Diagnosis not present

## 2015-03-11 DIAGNOSIS — F17210 Nicotine dependence, cigarettes, uncomplicated: Secondary | ICD-10-CM | POA: Diagnosis not present

## 2015-03-11 DIAGNOSIS — C7960 Secondary malignant neoplasm of unspecified ovary: Secondary | ICD-10-CM

## 2015-03-11 DIAGNOSIS — C3490 Malignant neoplasm of unspecified part of unspecified bronchus or lung: Secondary | ICD-10-CM

## 2015-03-11 DIAGNOSIS — C7971 Secondary malignant neoplasm of right adrenal gland: Secondary | ICD-10-CM | POA: Diagnosis not present

## 2015-03-11 DIAGNOSIS — C787 Secondary malignant neoplasm of liver and intrahepatic bile duct: Secondary | ICD-10-CM

## 2015-03-11 MED ORDER — NYSTATIN 100000 UNIT/ML MT SUSP: 5.0000 mL | Freq: Four times a day (QID) | OROMUCOSAL | Status: AC

## 2015-03-11 NOTE — Progress Notes (Signed)
[facility] day:  03/11/2015  Chief Complaint: Brandy Odom is a 64 y.o. female with carcinoid tumor of the cecum and metastatic small cell lung cancer who is seen for reassessment after recent hospitalization.  HPI:  The patient was last seen in the medical onocology clinic on 02/25/2015.  At that time, she was seen for assessment after initial hospitalization.  Pain was poorly controlled.  Oral intake was minimal.  She expressed the desire to pursue treatment.  Decision was made to admit the patient for pain control.  The patient was admitted to [facility] from 02/25/2015 until 03/06/2015.  She was seen by surgery, radiation oncology, palliative care medicine, and myself.  Pain was controlled with a Fentanyl patch, Oxycontin, and Dilaudid prn.  The patient vacillated on treatment.  She had a port placed on 03/03/2015.  She changed her mind several times about CNS radiation and chemotherapy.  At the end of her hospitalization, she decided to pursue Hospice.  Hospice has come out to the house.  She has been provided a hospital bed.  Her pain is well controlled.  She states that she spends a lot of the day on the couch ("it is soft").  She is independent on her activities of daily living except her bath.  She states that she wants to pursue treatment.     Past Medical History  Diagnosis Date  . Tobacco abuse   . Muscle spasm   . Small cell lung cancer [facility])     Metastatic    Past Surgical History  Procedure Laterality Date  . Appendectomy    . Colonoscopy with propofol N/A 02/15/2015    Procedure: COLONOSCOPY WITH PROPOFOL;  Surgeon: Lucilla Lame, MD;  Location: [facility] ENDOSCOPY;  Service: Endoscopy;  Laterality: N/A;  . Portacath placement N/A 03/03/2015    Procedure: INSERTION PORT-A-CATH;  Surgeon: Florene Glen, MD;  Location: [facility] ORS;  Service: General;  Laterality: N/A;    Family History  Problem Relation Age of Onset  . Colon cancer       Social History:  reports that she has been smoking Cigarettes.  She does not have any smokeless tobacco history on file. She reports that she does not drink alcohol or use illicit drugs.  The patient is accompanied by her son, Yvone Neu, today.  Allergies:  Allergies  Allergen Reactions  . Bee Venom Anaphylaxis  . Naproxen Nausea And Vomiting  . Sulfa Antibiotics Other (See Comments)    Reaction:  Unknown   . Antihistamines, Chlorpheniramine-Type Palpitations    Current Medications: Current Outpatient Prescriptions  Medication Sig Dispense Refill  . acetaminophen (TYLENOL) 325 MG tablet Take 2 tablets (650 mg total) by mouth every 4 (four) hours as needed for mild pain, fever or headache (or Fever >/= 101).    . bisacodyl (DULCOLAX) 10 MG suppository Place 1 suppository (10 mg total) rectally as needed for severe constipation. 6 suppository 0  . dexamethasone (DECADRON) 4 MG tablet Take 1 tablet (4 mg total) by mouth 2 (two) times daily with a meal. 30 tablet 1  . diazepam (VALIUM) 10 MG tablet Take 0.5-1 tablets (5-10 mg total) by mouth every 8 (eight) hours as needed for anxiety or muscle spasms. 30 tablet 0  . fentaNYL (DURAGESIC - DOSED MCG/HR) 75 MCG/HR Place 1 patch (75 mcg total) onto the skin every 3 (three) days. 5 patch 0  . HYDROmorphone (DILAUDID) 2 MG tablet Take 0.5 tablets (1 mg total) by  mouth every 3 (three) hours as needed for moderate pain or severe pain. 30 tablet 0  . megestrol (MEGACE) 40 MG/ML suspension Take 5 mLs (200 mg total) by mouth daily. 240 mL 0  . nicotine (NICODERM CQ - DOSED IN MG/24 HOURS) 21 mg/24hr patch Place 1 patch (21 mg total) onto the skin daily. 28 patch 0  . oxyCODONE (OXYCONTIN) 15 mg 12 hr tablet Take 1 tablet (15 mg total) by mouth every 12 (twelve) hours. 30 tablet 0  . QUEtiapine (SEROQUEL) 25 MG tablet Take 1 tablet (25 mg total) by mouth at bedtime. 15 tablet 0  . senna (SENOKOT) 8.6 MG TABS tablet Take 2 tablets (17.2 mg total) by mouth  at bedtime. 30 each 1   No current facility-administered medications for this visit.    Review of Systems:  GENERAL:  Feels better.  No fevers, sweats or weight loss. PERFORMANCE STATUS (ECOG):  2-3 HEENT: Diplopia on right lateral gaze (improved). No runny nose, sore throat, mouth sores or tenderness. Lungs: No shortness of breath or cough. No hemoptysis. Cardiac: Chest/rib pain, improved. No palpitations, orthopnea, or PND. GI: No vomiting, diarrhea, constipation, melena or hematochezia. GU: No urgency, frequency, dysuria, or hematuria. Musculoskeletal: Diffuse bone pain (improved). No muscle tenderness. Extremities: No swelling. Skin: No rashes or skin changes. Neuro: General weakness, improved.  Right upper forehead numbness.  No headache, weakness, balance or coordination issues. Endocrine: No diabetes, thyroid issues, hot flashes or night sweats. Psych: Anxiety. Pain: Diffuse bone pain, "worse every day". Review of systems: All other systems reviewed and found to be negative  Physical Exam: Blood pressure 115/77, pulse 103, temperature 97.3 F (36.3 C), temperature source Tympanic, resp. rate 18, height 5' 4"  (1.626 m), weight 138 lb 14.2 oz (63 kg). GENERAL: Chronically fatigued appearing woman sitting in a wheelchair in the exam room in no acute distress. She ambulates independently. MENTAL STATUS: Alert and oriented to person, place and time. HEAD: Shoulder length hair with dark roots. Normocephalic, atraumatic, face symmetric, no Cushingoid features. EYES: Brown eyes. Pupils equal round and reactive to light and accomodation.  No conjunctivitis or scleral icterus. ENT: Thrush. Tongue normal.  Mucous membranes moist.  RESPIRATORY: Poor respiratory excursion. No rales or wheezes.Marland Kitchen CARDIOVASCULAR: Regular rate and rhythm without murmur, rub or gallop. CHEST:  Left sided port-a-cath with overlying steri-strips. ABDOMEN: Soft, non-tender, with active  bowel sounds, and no appreciable hepatosplenomegaly. No masses. SKIN: No rashes or ulcers. EXTREMITIES: No edema, no skin discoloration or tenderness. No palpable cords. LYMPH NODES: Right posterior neck 2 cm mobile cyst (old). No palpable cervical, supraclavicular, axillary or inguinal adenopathy  NEUROLOGICAL: Stable. PSYCH: Appropriate.  No visits with results within 3 Day(s) from this visit. Latest known visit with results is:  Admission on 02/25/2015, Discharged on 03/06/2015  Component Date Value Ref Range Status  . Sodium 02/27/2015 148* 135 - 145 mmol/L Final  . Potassium 02/27/2015 3.6  3.5 - 5.1 mmol/L Final  . Chloride 02/27/2015 112* 101 - 111 mmol/L Final  . CO2 02/27/2015 28  22 - 32 mmol/L Final  . Glucose, Bld 02/27/2015 103* 65 - 99 mg/dL Final  . BUN 02/27/2015 6  6 - 20 mg/dL Final  . Creatinine, Ser 02/27/2015 0.71  0.44 - 1.00 mg/dL Final  . Calcium 02/27/2015 9.6  8.9 - 10.3 mg/dL Final  . GFR calc non Af Amer 02/27/2015 >60  >60 mL/min Final  . GFR calc Af Amer 02/27/2015 >60  >60 mL/min Final   Comment: (  NOTE) The eGFR has been calculated using the CKD EPI equation. This calculation has not been validated in all clinical situations. eGFR's persistently <60 mL/min signify possible Chronic Kidney Disease.   . Anion gap 02/27/2015 8  5 - 15 Final  . Glucose-Capillary 02/27/2015 88  65 - 99 mg/dL Final  . Glucose-Capillary 02/27/2015 105* 65 - 99 mg/dL Final  . Glucose-Capillary 02/27/2015 102* 65 - 99 mg/dL Final  . WBC 02/28/2015 4.3  3.6 - 11.0 K/uL Final  . RBC 02/28/2015 3.98  3.80 - 5.20 MIL/uL Final  . Hemoglobin 02/28/2015 11.1* 12.0 - 16.0 g/dL Final  . HCT 02/28/2015 32.9* 35.0 - 47.0 % Final  . MCV 02/28/2015 82.6  80.0 - 100.0 fL Final  . MCH 02/28/2015 27.9  26.0 - 34.0 pg Final  . MCHC 02/28/2015 33.7  32.0 - 36.0 g/dL Final  . RDW 02/28/2015 17.4* 11.5 - 14.5 % Final  . Platelets 02/28/2015 141* 150 - 440 K/uL Final  . Sodium  03/01/2015 142  135 - 145 mmol/L Final  . Potassium 03/01/2015 3.8  3.5 - 5.1 mmol/L Final  . Chloride 03/01/2015 104  101 - 111 mmol/L Final  . CO2 03/01/2015 30  22 - 32 mmol/L Final  . Glucose, Bld 03/01/2015 128* 65 - 99 mg/dL Final  . BUN 03/01/2015 <5* 6 - 20 mg/dL Final  . Creatinine, Ser 03/01/2015 0.88  0.44 - 1.00 mg/dL Final  . Calcium 03/01/2015 9.6  8.9 - 10.3 mg/dL Final  . GFR calc non Af Amer 03/01/2015 >60  >60 mL/min Final  . GFR calc Af Amer 03/01/2015 >60  >60 mL/min Final   Comment: (NOTE) The eGFR has been calculated using the CKD EPI equation. This calculation has not been validated in all clinical situations. eGFR's persistently <60 mL/min signify possible Chronic Kidney Disease.   . Anion gap 03/01/2015 8  5 - 15 Final  . WBC 03/03/2015 5.7  3.6 - 11.0 K/uL Final  . RBC 03/03/2015 4.01  3.80 - 5.20 MIL/uL Final  . Hemoglobin 03/03/2015 11.2* 12.0 - 16.0 g/dL Final  . HCT 03/03/2015 33.5* 35.0 - 47.0 % Final  . MCV 03/03/2015 83.6  80.0 - 100.0 fL Final  . MCH 03/03/2015 27.9  26.0 - 34.0 pg Final  . MCHC 03/03/2015 33.3  32.0 - 36.0 g/dL Final  . RDW 03/03/2015 19.0* 11.5 - 14.5 % Final  . Platelets 03/03/2015 161  150 - 440 K/uL Final    Assessment:  Brandy Odom is a 65 y.o. female with metastatic small cell lung cancer. She has a greater than 50 pack year smoking history. She presented with a 28 pound weight loss over 2 months, nausea, constipation, diplopia on lateral gaze, and diffuse bone pain.   Chest CT on 02/12/2015 revealed mixed lucent and sclerotic and expansile lesions in the spine, ribs, and sternum.  There was mediastinal lymphadenopathy with bilateral pulmonary nodules (some solid nodules and some patchy ground-glass nodules). There was focal spiculation in the right middle lung.  There was bilateral adrenal gland nodules and skin nodules.I  Abdominal/pelvic CT scan on 11/27/2016revealed a 3.7 x 3.2 x 3.7 cm mass in the cecum with  associated ileocolic lymphadenopathy (largest 3.8 cm), metastatic disease to adrenals bilaterally, and a suspicious lesion in segment 5 of the liver. There was widespread osseous metastasis. There were multiple low to intermediate attenuation cystic appearing lesions in the ovaries.  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 CA125 was 38.3 (0-38.1) on 02/15/2015.  Head CT revealed a small ovoid soft tissue lesion within the medial aspect of the left orbit just superior to the medial rectus muscle. Orbital MRI on 02/15/2015 revealed a 7 x10 x 9 mm lesion on the left superior oblique muscle and multiple focal areas of enhancement (cerebellum, left basal ganglia, thalamus, and anteromedial right frontal lobe) concerning for metastatic disease.  Bone scan on 02/15/2015 revealed abnormal uptake throughout the calvarium, ribs, vertebral bodies, pelvis, bilateral femurs and shoulders consistent with metastatic disease. Plain films reveal no evidence of impending fracture.  Colonoscopy on 02/15/2015 revealed a frond-like/villous partially obstructing non-circumferential mass in the cecum. 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 CEA was 333.8 on 02/14/2015.  CT guided right iliac crest biopsy on 02/19/2015 confirmed metastatic small cell carcinoma of the lung.  CK7, TTF1, CD56 were positive.  CK20 and CDX-2 were negative.  Labs revealed no evidence of myeloma (SPEP, free light chains). 24 hour urine revealed < 22.4 mg/24 hours of an M spike. There was no evidence of lymphoma. Serologies revealed prior hepatitis B infection.  Symptomatically, her pain is well controlled.  Diplopia and right upper forehead numbness has improved on Decadron.  She has thrush.  She is currently admitted under Hospice.  She wishes to pursue treatment.  Plan: 1.  Discuss patient's thoughts about therapy.  Patient wishes  treatment. Discuss CNS metastasis and need for radiation.  Patient agreeable to meet with radiation oncology (Dr Briant Cedar covering for Dr. Baruch Gouty).  Discuss palliative chemotherapy (carboplatin and etoposide) after completion of radiation.  Discuss treatment is not curative, but could prolong life.  Discuss need to talk with social services regarding Medicaid application as patient has no insurance.  Discuss patient's thoughts about Hospice.  Patient would like to continue services, but pursue treatment.  Discuss first addressing radiation and hopefully able to continue Hospice secondary to CNS symptoms (symptom mangement).  Discuss readdressing chemotherapy after radiation. 2.  Consult radiation oncology. 3.  Phone follow-up with radiation oncology (Dr De Burrs.  Dr Briant Cedar met with the patient in our clinic after her appointment today. 4.  Rx:  Nystatin suspension 5 cc swish and spit for thrush. 5.  Anticipate Decadron taper per radiation oncology. 6.  Encourage completion of paperwork for financial assistance. 7.  RTC after completion of radiation to determine if patient wishes to pursue chemotherapy.   Lequita Asal, MD  03/11/2015, 10:24 AM

## 2015-03-11 NOTE — Progress Notes (Signed)
Radiation Oncology Progress Note  The patient was seen at the request of Dr. 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   The patient was initially seen by Dr. Baruch Gouty on 02/16/15.  At that time she was an inpatient after presenting with increasing shortness of breath.  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  At that time she complained of numbness in her right face and diplopia.  She also had significant back pain.  MRI of the orbits showed a 10.5 mm lesion in the belly of the left superior oblique muscle in the orbit.  There were multiple focal areas of enhancement within the brain.  There was thickening and enhancement of the pituitary infundibulum and a normal optic chiasm.  Multiple calvarial lesions were seen.  On 2 occasions, she was brought to the radiation oncology department to start planning of her brain radiation therapy.  On Dr. Kem Parkinson evaluation today, as an outpatient, she noted that her pain was poorly controlled.  She is under care of the palliative care unit, receiving fentanyl, oxycontin, and dilaudid.  She is doing poorly at home.  On exam today, she cannot move her right eye laterally.  She complains of fuzzy vision there.  Other cranial nerves are intact.  Her gait is wide based.  She has tenderness over the lumbar spine.  Plan:  Reconsider cranial RT.  It was explained to the patient.  She agrees to a CT simulation later after she takes pain medication.  Will consider radiation therapy to painful areas of the back, if she agrees.  Would plan 30 Gy in 10 fractions.  Myrle Sheng, MD [place] for Dr. Baruch Gouty

## 2015-03-12 ENCOUNTER — Ambulatory Visit: Payer: Medicaid Other

## 2015-03-12 ENCOUNTER — Telehealth: Payer: Self-pay | Admitting: *Deleted

## 2015-03-12 ENCOUNTER — Other Ambulatory Visit: Payer: Self-pay | Admitting: *Deleted

## 2015-03-12 ENCOUNTER — Ambulatory Visit
Admission: RE | Admit: 2015-03-12 | Discharge: 2015-03-12 | Disposition: A | Discharge status: Home / Self Care | Payer: Medicaid Other | Source: Ambulatory Visit | Attending: Radiation Oncology | Admitting: Radiation Oncology

## 2015-03-12 DIAGNOSIS — C7931 Secondary malignant neoplasm of brain: Secondary | ICD-10-CM

## 2015-03-12 DIAGNOSIS — Z510 Encounter for antineoplastic radiation therapy: Secondary | ICD-10-CM | POA: Diagnosis not present

## 2015-03-12 NOTE — Telephone Encounter (Signed)
[facility] has called to have them pick up their equioment, they do not want Hospice or [place] at this time. Just wants to concentrate on the holidays right now

## 2015-03-16 ENCOUNTER — Other Ambulatory Visit: Payer: Self-pay | Admitting: *Deleted

## 2015-03-16 ENCOUNTER — Other Ambulatory Visit: Payer: Self-pay

## 2015-03-16 ENCOUNTER — Ambulatory Visit
Admission: RE | Admit: 2015-03-16 | Discharge: 2015-03-16 | Disposition: A | Discharge status: Home / Self Care | Payer: Medicaid Other | Source: Ambulatory Visit | Attending: Radiation Oncology | Admitting: Radiation Oncology

## 2015-03-16 DIAGNOSIS — Z510 Encounter for antineoplastic radiation therapy: Secondary | ICD-10-CM | POA: Diagnosis not present

## 2015-03-16 MED ORDER — HYDROMORPHONE HCL 2 MG PO TABS: 1.0000 mg | ORAL_TABLET | ORAL | Status: DC | PRN

## 2015-03-17 ENCOUNTER — Ambulatory Visit: Payer: Medicaid Other

## 2015-03-18 ENCOUNTER — Inpatient Hospital Stay: Payer: Medicaid Other

## 2015-03-18 ENCOUNTER — Ambulatory Visit
Admission: RE | Admit: 2015-03-18 | Discharge: 2015-03-18 | Disposition: A | Discharge status: Home / Self Care | Payer: Medicaid Other | Source: Ambulatory Visit | Attending: Radiation Oncology | Admitting: Radiation Oncology

## 2015-03-18 DIAGNOSIS — Z510 Encounter for antineoplastic radiation therapy: Secondary | ICD-10-CM | POA: Diagnosis not present

## 2015-03-19 ENCOUNTER — Ambulatory Visit
Admission: RE | Admit: 2015-03-19 | Discharge: 2015-03-19 | Disposition: A | Discharge status: Home / Self Care | Payer: Medicaid Other | Source: Ambulatory Visit | Attending: Radiation Oncology | Admitting: Radiation Oncology

## 2015-03-19 DIAGNOSIS — Z510 Encounter for antineoplastic radiation therapy: Secondary | ICD-10-CM | POA: Diagnosis not present

## 2015-03-23 ENCOUNTER — Ambulatory Visit: Payer: Medicaid Other

## 2015-03-23 ENCOUNTER — Ambulatory Visit
Admission: RE | Admit: 2015-03-23 | Discharge: 2015-03-23 | Disposition: A | Discharge status: Home / Self Care | Payer: Medicaid Other | Source: Ambulatory Visit | Attending: Radiation Oncology | Admitting: Radiation Oncology

## 2015-03-23 ENCOUNTER — Ambulatory Visit: Admission: RE | Admit: 2015-03-23 | Payer: Medicaid Other | Source: Ambulatory Visit

## 2015-03-24 ENCOUNTER — Telehealth: Payer: Self-pay | Admitting: *Deleted

## 2015-03-24 ENCOUNTER — Ambulatory Visit
Admission: RE | Admit: 2015-03-24 | Discharge: 2015-03-24 | Disposition: A | Discharge status: Home / Self Care | Payer: Medicaid Other | Source: Ambulatory Visit | Attending: Radiation Oncology | Admitting: Radiation Oncology

## 2015-03-24 ENCOUNTER — Other Ambulatory Visit: Payer: Self-pay

## 2015-03-24 DIAGNOSIS — Z510 Encounter for antineoplastic radiation therapy: Secondary | ICD-10-CM | POA: Diagnosis not present

## 2015-03-24 MED ORDER — DIAZEPAM 10 MG PO TABS: 5.0000 mg | ORAL_TABLET | Freq: Three times a day (TID) | ORAL | Status: AC | PRN

## 2015-03-24 MED ORDER — FENTANYL 75 MCG/HR TD PT72: 75.0000 ug | MEDICATED_PATCH | TRANSDERMAL | Status: DC

## 2015-03-24 NOTE — Telephone Encounter (Signed)
Noted patient not being compliant with appointments. Has requested hospice services at times and chemotherapy treatment at times. Attempted to contact to discuss goals of care and clarify plan of care. Voicemail left for patient or husband to return my call.

## 2015-03-25 ENCOUNTER — Ambulatory Visit
Admission: RE | Admit: 2015-03-25 | Discharge: 2015-03-25 | Disposition: A | Discharge status: Home / Self Care | Payer: Medicaid Other | Source: Ambulatory Visit | Attending: Radiation Oncology | Admitting: Radiation Oncology

## 2015-03-25 DIAGNOSIS — Z510 Encounter for antineoplastic radiation therapy: Secondary | ICD-10-CM | POA: Diagnosis not present

## 2015-03-26 ENCOUNTER — Ambulatory Visit: Payer: Medicaid Other

## 2015-03-26 ENCOUNTER — Other Ambulatory Visit: Payer: Self-pay | Admitting: Hematology and Oncology

## 2015-03-26 ENCOUNTER — Ambulatory Visit
Admission: RE | Admit: 2015-03-26 | Discharge: 2015-03-26 | Disposition: A | Discharge status: Home / Self Care | Payer: Medicaid Other | Source: Ambulatory Visit | Attending: Radiation Oncology | Admitting: Radiation Oncology

## 2015-03-26 DIAGNOSIS — C7951 Secondary malignant neoplasm of bone: Secondary | ICD-10-CM

## 2015-03-26 DIAGNOSIS — Z510 Encounter for antineoplastic radiation therapy: Secondary | ICD-10-CM | POA: Diagnosis not present

## 2015-03-26 DIAGNOSIS — R52 Pain, unspecified: Secondary | ICD-10-CM

## 2015-03-26 DIAGNOSIS — C3490 Malignant neoplasm of unspecified part of unspecified bronchus or lung: Secondary | ICD-10-CM

## 2015-03-26 MED ORDER — HYDROMORPHONE HCL 2 MG PO TABS: 1.0000 mg | ORAL_TABLET | ORAL | Status: DC | PRN

## 2015-03-29 ENCOUNTER — Ambulatory Visit: Payer: Medicaid Other

## 2015-03-30 ENCOUNTER — Ambulatory Visit: Payer: Medicaid Other

## 2015-03-30 ENCOUNTER — Ambulatory Visit: Admission: RE | Admit: 2015-03-30 | Payer: Medicaid Other | Source: Ambulatory Visit

## 2015-03-30 DIAGNOSIS — Z510 Encounter for antineoplastic radiation therapy: Secondary | ICD-10-CM | POA: Diagnosis not present

## 2015-03-31 ENCOUNTER — Ambulatory Visit: Payer: Medicaid Other

## 2015-03-31 ENCOUNTER — Ambulatory Visit
Admission: RE | Admit: 2015-03-31 | Discharge: 2015-03-31 | Disposition: A | Discharge status: Home / Self Care | Payer: Medicaid Other | Source: Ambulatory Visit | Attending: Radiation Oncology | Admitting: Radiation Oncology

## 2015-03-31 DIAGNOSIS — Z510 Encounter for antineoplastic radiation therapy: Secondary | ICD-10-CM | POA: Diagnosis not present

## 2015-04-01 ENCOUNTER — Ambulatory Visit: Payer: Medicaid Other

## 2015-04-02 ENCOUNTER — Ambulatory Visit: Payer: Medicaid Other

## 2015-04-05 ENCOUNTER — Ambulatory Visit: Payer: Medicaid Other

## 2015-04-06 ENCOUNTER — Ambulatory Visit: Payer: Medicaid Other

## 2015-04-06 ENCOUNTER — Ambulatory Visit
Admission: RE | Admit: 2015-04-06 | Discharge: 2015-04-06 | Disposition: A | Discharge status: Home / Self Care | Payer: Medicaid Other | Source: Ambulatory Visit | Attending: Radiation Oncology | Admitting: Radiation Oncology

## 2015-04-06 DIAGNOSIS — Z510 Encounter for antineoplastic radiation therapy: Secondary | ICD-10-CM | POA: Diagnosis not present

## 2015-04-07 ENCOUNTER — Ambulatory Visit: Payer: Medicaid Other

## 2015-04-07 ENCOUNTER — Other Ambulatory Visit: Payer: Self-pay | Admitting: *Deleted

## 2015-04-07 MED ORDER — DEXAMETHASONE 4 MG PO TABS: 4.0000 mg | ORAL_TABLET | Freq: Every day | ORAL | Status: DC

## 2015-04-08 ENCOUNTER — Ambulatory Visit: Payer: Medicaid Other

## 2015-04-09 ENCOUNTER — Ambulatory Visit: Payer: Medicaid Other

## 2015-04-12 ENCOUNTER — Other Ambulatory Visit: Payer: Self-pay

## 2015-04-12 ENCOUNTER — Ambulatory Visit: Payer: Medicaid Other

## 2015-04-12 ENCOUNTER — Ambulatory Visit: Payer: Self-pay | Admitting: Radiation Oncology

## 2015-04-12 MED ORDER — HYDROMORPHONE HCL 2 MG PO TABS: 1.0000 mg | ORAL_TABLET | ORAL | Status: DC | PRN

## 2015-04-13 ENCOUNTER — Ambulatory Visit: Payer: Medicaid Other

## 2015-04-14 ENCOUNTER — Ambulatory Visit: Payer: Medicaid Other

## 2015-04-15 ENCOUNTER — Ambulatory Visit: Payer: Medicaid Other

## 2015-04-16 ENCOUNTER — Ambulatory Visit: Payer: Medicaid Other

## 2015-04-19 ENCOUNTER — Inpatient Hospital Stay: Payer: Medicaid Other | Attending: Hematology and Oncology | Admitting: Hematology and Oncology

## 2015-04-19 ENCOUNTER — Other Ambulatory Visit: Payer: Self-pay | Admitting: *Deleted

## 2015-04-19 ENCOUNTER — Ambulatory Visit: Payer: Medicaid Other

## 2015-04-19 VITALS — BP 138/87 | HR 98 | Temp 98.6°F | Resp 18 | Ht 64.0 in | Wt 126.5 lb

## 2015-04-19 DIAGNOSIS — C7800 Secondary malignant neoplasm of unspecified lung: Secondary | ICD-10-CM | POA: Insufficient documentation

## 2015-04-19 DIAGNOSIS — G893 Neoplasm related pain (acute) (chronic): Secondary | ICD-10-CM

## 2015-04-19 DIAGNOSIS — F17210 Nicotine dependence, cigarettes, uncomplicated: Secondary | ICD-10-CM

## 2015-04-19 DIAGNOSIS — C787 Secondary malignant neoplasm of liver and intrahepatic bile duct: Secondary | ICD-10-CM | POA: Insufficient documentation

## 2015-04-19 DIAGNOSIS — C7951 Secondary malignant neoplasm of bone: Secondary | ICD-10-CM | POA: Diagnosis not present

## 2015-04-19 DIAGNOSIS — C7A021 Malignant carcinoid tumor of the cecum: Secondary | ICD-10-CM | POA: Insufficient documentation

## 2015-04-19 DIAGNOSIS — C7960 Secondary malignant neoplasm of unspecified ovary: Secondary | ICD-10-CM | POA: Diagnosis not present

## 2015-04-19 DIAGNOSIS — C7931 Secondary malignant neoplasm of brain: Secondary | ICD-10-CM

## 2015-04-19 DIAGNOSIS — Z79899 Other long term (current) drug therapy: Secondary | ICD-10-CM | POA: Insufficient documentation

## 2015-04-19 DIAGNOSIS — C7A029 Malignant carcinoid tumor of the large intestine, unspecified portion: Secondary | ICD-10-CM

## 2015-04-19 DIAGNOSIS — C3490 Malignant neoplasm of unspecified part of unspecified bronchus or lung: Secondary | ICD-10-CM

## 2015-04-19 MED ORDER — HYDROMORPHONE HCL 2 MG PO TABS: 1.0000 mg | ORAL_TABLET | ORAL | Status: DC | PRN

## 2015-04-19 NOTE — Telephone Encounter (Signed)
Informed that prescription is ready to pick up  

## 2015-04-19 NOTE — Progress Notes (Signed)
[facility] day:  04/19/2015  Chief Complaint: Brandy Odom is a 65 y.o. female with carcinoid tumor of the cecum and metastatic small cell lung cancer who is seen for reassessment after completion of cranial radiation.  HPI:  The patient was last seen in the medical onocology clinic on 03/11/2015.  At that time, she was seen for assessment after a recent hospitalization.  She was admitted to [facility] from 02/25/2015 until 03/06/2015.  She was seen by surgery, radiation oncology, palliative care medicine, and myself.  Pain was controlled with a Fentanyl patch, Oxycontin, and Dilaudid prn.  The patient vacillated on treatment.  She had a port placed on 03/03/2015.  She changed her mind several times about CNS radiation and chemotherapy.  At the end of her hospitalization, she decided to pursue Hospice.  At last visit, Hospice was coming out to the house.  She had been provided a hospital bed.  Her pain was well controlled.  She was spending a lot of the day on the couch.  She was independent on her activities of daily living except her bath.  She states that she wanted to pursue treatment.    She states that she discharged Hospice and returned the equipment.  She states that she completed CNS radiation.  She has a follow-up appointment with Dr. Baruch Gouty for reassessment on Wednesday.  She is unsure if there are plans to radiate her back.  She states that she is using only Dilaudid for her diffuse bone pain.  She will take 1/2-1 pill at a time.  She states that Dilaudid typically last 3-4 hours and brings her pain level down from an 8 or a 9 to just "discomfort". She doesn't like the Fentanyl patch.  She states that she is able to perform her activities of daily living.  She states that she is able to make a sandwich. She does some washing. He needs a little help dressing herself as well as a taking a shower.  She states that her vision is better. She only  notes a little bit of blurriness.  He has learned to "adjust".  She is had no headache, nausea or vomiting. She is off her Decadron.  Her breathing is "fine". She does not require oxygen. She denies any numbness or weakness in her legs. She's had some issues with diarrhea. She has used Imodium.  Past Medical History  Diagnosis Date  . Tobacco abuse   . Muscle spasm   . Small cell lung cancer [facility])     Metastatic    Past Surgical History  Procedure Laterality Date  . Appendectomy    . Colonoscopy with propofol N/A 02/15/2015    Procedure: COLONOSCOPY WITH PROPOFOL;  Surgeon: Lucilla Lame, MD;  Location: [facility] ENDOSCOPY;  Service: Endoscopy;  Laterality: N/A;  . Portacath placement N/A 03/03/2015    Procedure: INSERTION PORT-A-CATH;  Surgeon: Florene Glen, MD;  Location: [facility] ORS;  Service: General;  Laterality: N/A;    Family History  Problem Relation Age of Onset  . Colon cancer      Social History:  reports that she has been smoking Cigarettes.  She does not have any smokeless tobacco history on file. She reports that she does not drink alcohol or use illicit drugs.  She states that her husband is working today.  She has a friend waiting for her in the waiting room.  She is unaware of the status of her financial assistance.  The patient is alone today.  Allergies:  Allergies  Allergen Reactions  . Bee Venom Anaphylaxis  . Naproxen Nausea And Vomiting  . Sulfa Antibiotics Other (See Comments)    Reaction:  Unknown   . Antihistamines, Chlorpheniramine-Type Palpitations    Current Medications: Current Outpatient Prescriptions  Medication Sig Dispense Refill  . acetaminophen (TYLENOL) 325 MG tablet Take 2 tablets (650 mg total) by mouth every 4 (four) hours as needed for mild pain, fever or headache (or Fever >/= 101).    . bisacodyl (DULCOLAX) 10 MG suppository Place 1 suppository (10 mg total) rectally as needed for severe constipation. 6 suppository 0  . diazepam (VALIUM)  10 MG tablet Take 0.5-1 tablets (5-10 mg total) by mouth every 8 (eight) hours as needed. 30 tablet 0  . HYDROmorphone (DILAUDID) 2 MG tablet Take 0.5 tablets (1 mg total) by mouth every 3 (three) hours as needed for moderate pain or severe pain. 40 tablet 0  . nicotine (NICODERM CQ - DOSED IN MG/24 HOURS) 21 mg/24hr patch Place 1 patch (21 mg total) onto the skin daily. 28 patch 0  . nystatin (MYCOSTATIN) 100000 UNIT/ML suspension Take 5 mLs (500,000 Units total) by mouth 4 (four) times daily. 60 mL 1  . oxyCODONE (OXYCONTIN) 15 mg 12 hr tablet Take 1 tablet (15 mg total) by mouth every 12 (twelve) hours. 30 tablet 0  . QUEtiapine (SEROQUEL) 25 MG tablet Take 1 tablet (25 mg total) by mouth at bedtime. 15 tablet 0  . senna (SENOKOT) 8.6 MG TABS tablet Take 2 tablets (17.2 mg total) by mouth at bedtime. 30 each 1   No current facility-administered medications for this visit.    Review of Systems:  GENERAL:  Feels"ok".  No fevers or sweats.  Weight down 10 pounds. PERFORMANCE STATUS (ECOG):  3 HEENT: Vision s a little blurry (improved s/p radiation). No runny nose, sore throat, mouth sores or tenderness. Lungs: No shortness of breath or cough. No hemoptysis. Cardiac: Chest/rib pain, improved. No palpitations, orthopnea, or PND. GI: Appetite 30%.  Some diarrhea.  No vomiting, constipation, melena or hematochezia. GU: No urgency, frequency, dysuria, or hematuria. Musculoskeletal: Diffuse bone pain (controlled on Dilaudid). No muscle tenderness. Extremities: No swelling. Skin: No rashes or skin changes. Neuro: General weakness.  No headache, weakness, balance or coordination issues. Endocrine: No diabetes, thyroid issues, hot flashes or night sweats. Psych: Anxiety. Pain: Diffuse bone pain, controlled. Review of systems: All other systems reviewed and found to be negative  Physical Exam: Blood pressure 138/87, pulse 98, temperature 98.6 F (37 C), temperature source  Tympanic, resp. rate 18, height 5' 4"  (1.626 m), weight 126 lb 8.7 oz (57.4 kg). GENERAL: Chronically fatigued appearing woman sitting in a wheelchair in the exam room in no acute distress. MENTAL STATUS: Alert and oriented to person, place and time. HEAD: Wearing a beige cap with strands of hair protruding. Normocephalic, atraumatic, face symmetric, no Cushingoid features. EYES: Brown eyes. Pupils equal round and reactive to light and accomodation.  No conjunctivitis or scleral icterus. ENT: No oral lesions.  Tongue normal.  Mucous membranes moist.  RESPIRATORY: Poor respiratory excursion. No rales or wheezes.Marland Kitchen CARDIOVASCULAR: Regular rate and rhythm without murmur, rub or gallop. CHEST:  Left sided port-a-cath. ABDOMEN: Soft, non-tender, with active bowel sounds, and no appreciable hepatosplenomegaly. No masses. SKIN: No rashes or ulcers. EXTREMITIES: No edema, no skin discoloration or tenderness. No palpable cords. LYMPH NODES: Right posterior neck 2 cm mobile cyst (old). No palpable cervical, supraclavicular, axillary  or inguinal adenopathy  NEUROLOGICAL: Stable. PSYCH: Appropriate.  No visits with results within 3 Day(s) from this visit. Latest known visit with results is:  Admission on 02/25/2015, Discharged on 03/06/2015  Component Date Value Ref Range Status  . Sodium 02/27/2015 148* 135 - 145 mmol/L Final  . Potassium 02/27/2015 3.6  3.5 - 5.1 mmol/L Final  . Chloride 02/27/2015 112* 101 - 111 mmol/L Final  . CO2 02/27/2015 28  22 - 32 mmol/L Final  . Glucose, Bld 02/27/2015 103* 65 - 99 mg/dL Final  . BUN 02/27/2015 6  6 - 20 mg/dL Final  . Creatinine, Ser 02/27/2015 0.71  0.44 - 1.00 mg/dL Final  . Calcium 02/27/2015 9.6  8.9 - 10.3 mg/dL Final  . GFR calc non Af Amer 02/27/2015 >60  >60 mL/min Final  . GFR calc Af Amer 02/27/2015 >60  >60 mL/min Final   Comment: (NOTE) The eGFR has been calculated using the CKD EPI equation. This calculation has not been  validated in all clinical situations. eGFR's persistently <60 mL/min signify possible Chronic Kidney Disease.   . Anion gap 02/27/2015 8  5 - 15 Final  . Glucose-Capillary 02/27/2015 88  65 - 99 mg/dL Final  . Glucose-Capillary 02/27/2015 105* 65 - 99 mg/dL Final  . Glucose-Capillary 02/27/2015 102* 65 - 99 mg/dL Final  . WBC 02/28/2015 4.3  3.6 - 11.0 K/uL Final  . RBC 02/28/2015 3.98  3.80 - 5.20 MIL/uL Final  . Hemoglobin 02/28/2015 11.1* 12.0 - 16.0 g/dL Final  . HCT 02/28/2015 32.9* 35.0 - 47.0 % Final  . MCV 02/28/2015 82.6  80.0 - 100.0 fL Final  . MCH 02/28/2015 27.9  26.0 - 34.0 pg Final  . MCHC 02/28/2015 33.7  32.0 - 36.0 g/dL Final  . RDW 02/28/2015 17.4* 11.5 - 14.5 % Final  . Platelets 02/28/2015 141* 150 - 440 K/uL Final  . Sodium 03/01/2015 142  135 - 145 mmol/L Final  . Potassium 03/01/2015 3.8  3.5 - 5.1 mmol/L Final  . Chloride 03/01/2015 104  101 - 111 mmol/L Final  . CO2 03/01/2015 30  22 - 32 mmol/L Final  . Glucose, Bld 03/01/2015 128* 65 - 99 mg/dL Final  . BUN 03/01/2015 <5* 6 - 20 mg/dL Final  . Creatinine, Ser 03/01/2015 0.88  0.44 - 1.00 mg/dL Final  . Calcium 03/01/2015 9.6  8.9 - 10.3 mg/dL Final  . GFR calc non Af Amer 03/01/2015 >60  >60 mL/min Final  . GFR calc Af Amer 03/01/2015 >60  >60 mL/min Final   Comment: (NOTE) The eGFR has been calculated using the CKD EPI equation. This calculation has not been validated in all clinical situations. eGFR's persistently <60 mL/min signify possible Chronic Kidney Disease.   . Anion gap 03/01/2015 8  5 - 15 Final  . WBC 03/03/2015 5.7  3.6 - 11.0 K/uL Final  . RBC 03/03/2015 4.01  3.80 - 5.20 MIL/uL Final  . Hemoglobin 03/03/2015 11.2* 12.0 - 16.0 g/dL Final  . HCT 03/03/2015 33.5* 35.0 - 47.0 % Final  . MCV 03/03/2015 83.6  80.0 - 100.0 fL Final  . MCH 03/03/2015 27.9  26.0 - 34.0 pg Final  . MCHC 03/03/2015 33.3  32.0 - 36.0 g/dL Final  . RDW 03/03/2015 19.0* 11.5 - 14.5 % Final  . Platelets  03/03/2015 161  150 - 440 K/uL Final    Assessment:  SHIRELL STRUTHERS is a 65 y.o. female with metastatic small cell lung cancer. She has a  greater than 50 pack year smoking history. She presented with a 28 pound weight loss over 2 months, nausea, constipation, diplopia on lateral gaze, and diffuse bone pain.   Chest CT on 02/12/2015 revealed mixed lucent and sclerotic and expansile lesions in the spine, ribs, and sternum.  There was mediastinal lymphadenopathy with bilateral pulmonary nodules (some solid nodules and some patchy ground-glass nodules). There was focal spiculation in the right middle lung.  There was bilateral adrenal gland nodules and skin nodules.I  Abdominal/pelvic CT scan on 11/27/2016revealed a 3.7 x 3.2 x 3.7 cm mass in the cecum with associated ileocolic lymphadenopathy (largest 3.8 cm), metastatic disease to adrenals bilaterally, and a suspicious lesion in segment 5 of the liver. There was widespread osseous metastasis. There were multiple low to intermediate attenuation cystic appearing lesions in the ovaries.  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 CA125 was 38.3 (0-38.1) on 02/15/2015.  Head CT revealed a small ovoid soft tissue lesion within the medial aspect of the left orbit just superior to the medial rectus muscle. Orbital MRI on 02/15/2015 revealed a 7 x10 x 9 mm lesion on the left superior oblique muscle and multiple focal areas of enhancement (cerebellum, left basal ganglia, thalamus, and anteromedial right frontal lobe) concerning for metastatic disease.  Bone scan on 02/15/2015 revealed abnormal uptake throughout the calvarium, ribs, vertebral bodies, pelvis, bilateral femurs and shoulders consistent with metastatic disease. Plain films reveal no evidence of impending fracture.  Colonoscopy on 02/15/2015 revealed a frond-like/villous partially obstructing  non-circumferential mass in the cecum. 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 CEA was 333.8 on 02/14/2015.  CT guided right iliac crest biopsy on 02/19/2015 confirmed metastatic small cell carcinoma of the lung.  CK7, TTF1, CD56 were positive.  CK20 and CDX-2 were negative.  She completed cranial radiation for CNS metastasis.  She is no longer in Hospice.  She is interested in pursuing treatment, although she has vacilated several times in the past.  It is unclear if she and her husband have completed paperwork for medical coverage.    Symptomatically, her pain is well controlled on Dilaudid prn.  She does not want to use any other pain medications.  She is able to perform most of her ADLs (except dress and shower).  Plan: 1.  Discuss patient's thoughts about therapy.  Patient wishes to pursue chemotherapy.  Discuss chemotherapy (carboplatin and etoposide every 3 weeks for 4-6 cycles).  Discuss side effects of chemotherapy.  Discuss treatment is not curative.  Discuss current performance status and concern about function after treatment and support needed at home.  Discuss patient's commitment to treatment and concern about patient and her husband changing their minds several times over the past few months.  Discuss baseline PET scan prior to treatment if decision is made to initiate therapy.  Discuss meeting with both she and her husband to finalize treatment plan. 2.  Port flush every 6-8 weeks. 3.  RTC in 1 week for MD meeting with patient and her husband to discuss possible treatment.   Lequita Asal, MD  04/19/2015, 11:53 AM

## 2015-04-20 ENCOUNTER — Ambulatory Visit: Payer: Medicaid Other

## 2015-04-21 ENCOUNTER — Encounter: Payer: Self-pay | Admitting: Radiation Oncology

## 2015-04-21 ENCOUNTER — Ambulatory Visit
Admission: RE | Admit: 2015-04-21 | Discharge: 2015-04-21 | Disposition: A | Discharge status: Home / Self Care | Payer: Self-pay | Source: Ambulatory Visit | Attending: Radiation Oncology | Admitting: Radiation Oncology

## 2015-04-21 ENCOUNTER — Ambulatory Visit: Payer: Medicaid Other

## 2015-04-21 VITALS — BP 135/83 | HR 105 | Temp 97.5°F

## 2015-04-21 DIAGNOSIS — C7931 Secondary malignant neoplasm of brain: Secondary | ICD-10-CM

## 2015-04-21 DIAGNOSIS — Z510 Encounter for antineoplastic radiation therapy: Secondary | ICD-10-CM | POA: Insufficient documentation

## 2015-04-21 DIAGNOSIS — C189 Malignant neoplasm of colon, unspecified: Secondary | ICD-10-CM | POA: Insufficient documentation

## 2015-04-21 DIAGNOSIS — C7951 Secondary malignant neoplasm of bone: Secondary | ICD-10-CM | POA: Insufficient documentation

## 2015-04-21 NOTE — Progress Notes (Signed)
Radiation Oncology Follow up Note  Name: Brandy Odom   Date:   04/21/2015 MRN:  [id number] DOB: 03-15-1951    This 65 y.o. 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  REFERRING PROVIDER: No ref. provider found  HPI: Patient is a 65 year old female has completed palliative radiation therapy to her whole brain for brain metastasis. We will Plan on treating her lumbar spine and SI joints for bone metastasis as evident on her bone scan. She is doing fairly well this time they have discontinued hospice care. She seen Dr. Mike Gip numerous times about palliative chemotherapy although still not made a decision on that. There is seen today and requesting some palliation for her lower back. She specifically denies any change in neurologic status change in visual fields or any motor or sensory loss. She does have significant lower extremity edema noted..  COMPLICATIONS OF TREATMENT: none  FOLLOW UP COMPLIANCE: keeps appointments   PHYSICAL EXAM:  BP 135/83 mmHg  Pulse 105  Temp(Src) 97.5 F (36.4 C) Well-developed wheelchair-bound female in NAD cranial nerves II through XII are grossly intact motor sensory and DTR levels are equal and symmetric in upper lower extremities. She does have +3 edema in her lower extremities bilaterally. Well-developed well-nourished patient in NAD. HEENT reveals PERLA, EOMI, discs not visualized.  Oral cavity is clear. No oral mucosal lesions are identified. Neck is clear without evidence of cervical or supraclavicular adenopathy. Lungs are clear to A&P. Cardiac examination is essentially unremarkable with regular rate and rhythm without murmur rub or thrill. Abdomen is benign with no organomegaly or masses noted. Motor sensory and DTR levels are equal and symmetric in the upper and lower extremities. Cranial nerves II through XII are grossly intact. Proprioception is intact. No peripheral adenopathy or edema is  identified. No motor or sensory levels are noted. Crude visual fields are within normal range.  RADIOLOGY RESULTS: Bone scan is reviewed  PLAN: At this time like to go ahead with palliative radiation therapy to her lumbar spine and SI joints as per our original plan. I have set up and ordered CT simulation for tomorrow. Risks and benefits of treatment including possible diarrhea fatigue alteration of blood counts skin reaction all were reviewed in detail with the patient and her husband. They both seem to comprehend my treatment plan well.  I would like to take this opportunity for allowing me to participate in the care of your patient.Armstead Peaks., MD

## 2015-04-22 ENCOUNTER — Telehealth: Payer: Self-pay | Admitting: *Deleted

## 2015-04-22 ENCOUNTER — Ambulatory Visit
Admission: RE | Admit: 2015-04-22 | Discharge: 2015-04-22 | Disposition: A | Discharge status: Home / Self Care | Payer: Self-pay | Source: Ambulatory Visit | Attending: Radiation Oncology | Admitting: Radiation Oncology

## 2015-04-22 DIAGNOSIS — C3490 Malignant neoplasm of unspecified part of unspecified bronchus or lung: Secondary | ICD-10-CM

## 2015-04-22 DIAGNOSIS — C7931 Secondary malignant neoplasm of brain: Secondary | ICD-10-CM

## 2015-04-22 DIAGNOSIS — R600 Localized edema: Secondary | ICD-10-CM

## 2015-04-22 NOTE — Telephone Encounter (Signed)
  Please call patient. Need to schedule a bilateral lower extremity duplex to r/o DVT.  M

## 2015-04-22 NOTE — Telephone Encounter (Signed)
Asking for med for swelling in both legs. Denies SOB or other sx

## 2015-04-22 NOTE — Telephone Encounter (Signed)
Patient refusing to come back over today for [place] but will come tomorrow Message sent to scheduling and have spoken to [place] who will try to get it scheduled. Order entered

## 2015-04-22 NOTE — Telephone Encounter (Signed)
I spoke with Mr Page and he repeated to me to have her at [facility] at [phone] tomorrow

## 2015-04-23 ENCOUNTER — Other Ambulatory Visit: Payer: Self-pay | Admitting: *Deleted

## 2015-04-23 ENCOUNTER — Ambulatory Visit
Admission: RE | Admit: 2015-04-23 | Discharge: 2015-04-23 | Disposition: A | Discharge status: Home / Self Care | Payer: Self-pay | Source: Ambulatory Visit | Attending: Hematology and Oncology | Admitting: Hematology and Oncology

## 2015-04-23 ENCOUNTER — Other Ambulatory Visit: Payer: Self-pay | Admitting: Hematology and Oncology

## 2015-04-23 DIAGNOSIS — R600 Localized edema: Secondary | ICD-10-CM

## 2015-04-23 DIAGNOSIS — C3490 Malignant neoplasm of unspecified part of unspecified bronchus or lung: Secondary | ICD-10-CM

## 2015-04-23 DIAGNOSIS — C7951 Secondary malignant neoplasm of bone: Secondary | ICD-10-CM

## 2015-04-23 DIAGNOSIS — C7931 Secondary malignant neoplasm of brain: Secondary | ICD-10-CM

## 2015-04-26 ENCOUNTER — Encounter: Payer: Self-pay | Admitting: Hematology and Oncology

## 2015-04-27 ENCOUNTER — Ambulatory Visit: Admission: RE | Admit: 2015-04-27 | Payer: Self-pay | Source: Ambulatory Visit

## 2015-04-28 ENCOUNTER — Ambulatory Visit: Admission: RE | Admit: 2015-04-28 | Payer: Self-pay | Source: Ambulatory Visit

## 2015-04-28 ENCOUNTER — Ambulatory Visit: Payer: Self-pay

## 2015-04-29 ENCOUNTER — Ambulatory Visit
Admission: RE | Admit: 2015-04-29 | Discharge: 2015-04-29 | Disposition: A | Discharge status: Home / Self Care | Payer: Self-pay | Source: Ambulatory Visit | Attending: Radiation Oncology | Admitting: Radiation Oncology

## 2015-04-29 ENCOUNTER — Inpatient Hospital Stay: Payer: Self-pay | Attending: Internal Medicine | Admitting: Family Medicine

## 2015-04-29 VITALS — BP 114/75 | HR 109 | Temp 98.6°F | Resp 20 | Ht 64.0 in

## 2015-04-29 DIAGNOSIS — R590 Localized enlarged lymph nodes: Secondary | ICD-10-CM | POA: Insufficient documentation

## 2015-04-29 DIAGNOSIS — F17210 Nicotine dependence, cigarettes, uncomplicated: Secondary | ICD-10-CM | POA: Insufficient documentation

## 2015-04-29 DIAGNOSIS — C7A021 Malignant carcinoid tumor of the cecum: Secondary | ICD-10-CM | POA: Insufficient documentation

## 2015-04-29 DIAGNOSIS — C787 Secondary malignant neoplasm of liver and intrahepatic bile duct: Secondary | ICD-10-CM | POA: Insufficient documentation

## 2015-04-29 DIAGNOSIS — C7800 Secondary malignant neoplasm of unspecified lung: Secondary | ICD-10-CM | POA: Insufficient documentation

## 2015-04-29 DIAGNOSIS — C3490 Malignant neoplasm of unspecified part of unspecified bronchus or lung: Secondary | ICD-10-CM

## 2015-04-29 DIAGNOSIS — Z923 Personal history of irradiation: Secondary | ICD-10-CM | POA: Insufficient documentation

## 2015-04-29 DIAGNOSIS — Z79899 Other long term (current) drug therapy: Secondary | ICD-10-CM | POA: Insufficient documentation

## 2015-04-29 DIAGNOSIS — C7951 Secondary malignant neoplasm of bone: Secondary | ICD-10-CM | POA: Insufficient documentation

## 2015-04-29 DIAGNOSIS — C7960 Secondary malignant neoplasm of unspecified ovary: Secondary | ICD-10-CM | POA: Insufficient documentation

## 2015-04-29 DIAGNOSIS — R938 Abnormal findings on diagnostic imaging of other specified body structures: Secondary | ICD-10-CM | POA: Insufficient documentation

## 2015-04-29 MED ORDER — POTASSIUM CHLORIDE CRYS ER 20 MEQ PO TBCR: 20.0000 meq | EXTENDED_RELEASE_TABLET | Freq: Every day | ORAL | Status: AC

## 2015-04-29 MED ORDER — HYDROMORPHONE HCL 2 MG PO TABS: 1.0000 mg | ORAL_TABLET | ORAL | Status: DC | PRN

## 2015-04-29 MED ORDER — FUROSEMIDE 20 MG PO TABS: 20.0000 mg | ORAL_TABLET | Freq: Every day | ORAL | Status: AC

## 2015-04-29 NOTE — Progress Notes (Signed)
Pt reporting smoking every day with an additional 2 nicotine patches.  Pt reports pain is still the same.  Pt has a cough but reports no sputum.  Pt reports not eating.  Pt has +2/+3 pitting edema in both.  Pt's reports having a mucous like feeling that she cannot cough up.    While talking with pt she mentioned she would like to talk with someone about her cancer her family does not understand.  By the end of her appointment today and before I could put consult in and hearing about her disease process pt wanted to just go and asked twice if they could leave.  Pt and family to think about Hospice or proceding with scan and possible chemo.  Husband verbalized he is currently looking into medicaid and applying.  Pt and husband to return next week to see Dr. Mike Gip to discuss scan and decision for further treatment.  Pt seems more confused today and when asking her if she feels anything has changed she reports being more snappy.Marland KitchenMarland Kitchen

## 2015-04-30 ENCOUNTER — Ambulatory Visit: Payer: Self-pay

## 2015-05-03 ENCOUNTER — Ambulatory Visit: Payer: Self-pay

## 2015-05-03 NOTE — Progress Notes (Signed)
[facility] day:  04/29/2015  Chief Complaint: Brandy Odom is a 65 y.o. female with carcinoid tumor of the cecum and metastatic small cell lung cancer who is seen for reassessment after completion of cranial radiation.  HPI:  The patient was last seen in the medical onocology clinic on 03/11/2015.  At that time, she was seen for assessment after a recent hospitalization.  She was admitted to [facility] from 02/25/2015 until 03/06/2015.  She was seen by surgery, radiation oncology, palliative care medicine, and myself.  Pain was controlled with a Fentanyl patch, Oxycontin, and Dilaudid prn.  The patient vacillated on treatment.  She had a port placed on 03/03/2015.  She changed her mind several times about CNS radiation and chemotherapy.  At the end of her hospitalization, she decided to pursue Hospice.  At last visit, Hospice was coming out to the house.  She had been provided a hospital bed.  Her pain was well controlled.  She was spending a lot of the day on the couch.  She was independent on her activities of daily living except her bath.  She states that she wanted to pursue treatment.    She states that she discharged Hospice and returned the equipment.  She states that she completed CNS radiation.  She is currently under treatment with XRT again.   She states that she is using only Dilaudid for her diffuse bone pain.  She will take 1/2-1 pill at a time.  She states that Dilaudid typically last 3-4 hours and brings her pain level down from an 8 or a 9 to just "discomfort". She doesn't like the Fentanyl patch.  She has been smoking with nicotine patches in place. Dry hacking cough. Her lower extremities have continued to swell and are 3+ pitting edema. She is concerned about no pursuing treatment now and wants to discuss this with her primary Oncologist.    Past Medical History  Diagnosis Date  . Tobacco abuse   . Muscle spasm   . Small cell lung  cancer [facility]     Metastatic    Past Surgical History  Procedure Laterality Date  . Appendectomy    . Colonoscopy with propofol N/A 02/15/2015    Procedure: COLONOSCOPY WITH PROPOFOL;  Surgeon: Lucilla Lame, MD;  Location: [facility] ENDOSCOPY;  Service: Endoscopy;  Laterality: N/A;  . Portacath placement N/A 03/03/2015    Procedure: INSERTION PORT-A-CATH;  Surgeon: Florene Glen, MD;  Location: [facility] ORS;  Service: General;  Laterality: N/A;    Family History  Problem Relation Age of Onset  . Colon cancer      Social History:  reports that she has been smoking Cigarettes.  She does not have any smokeless tobacco history on file. She reports that she does not drink alcohol or use illicit drugs.  She states that her husband is working today.  She has a friend waiting for her in the waiting room.  She is unaware of the status of her financial assistance.  The patient is alone today.  Allergies:  Allergies  Allergen Reactions  . Bee Venom Anaphylaxis  . Naproxen Nausea And Vomiting  . Sulfa Antibiotics Other (See Comments)    Reaction:  Unknown   . Antihistamines, Chlorpheniramine-Type Palpitations    Current Medications: Current Outpatient Prescriptions  Medication Sig Dispense Refill  . acetaminophen (TYLENOL) 325 MG tablet Take 2 tablets (650 mg total) by mouth every 4 (four) hours as needed for mild pain,  fever or headache (or Fever >/= 101).    . bisacodyl (DULCOLAX) 10 MG suppository Place 1 suppository (10 mg total) rectally as needed for severe constipation. 6 suppository 0  . diazepam (VALIUM) 10 MG tablet Take 0.5-1 tablets (5-10 mg total) by mouth every 8 (eight) hours as needed. 30 tablet 0  . fentaNYL (DURAGESIC - DOSED MCG/HR) 25 MCG/HR patch PLACE 1 PATCH ONTO SKIN EVERY 3 DAYS  0  . fentaNYL (DURAGESIC - DOSED MCG/HR) 75 MCG/HR APPLY 1 PATCH ONTO SKIN EVERY 3 DAYS  0  . HYDROmorphone (DILAUDID) 2 MG tablet Take 0.5 tablets (1 mg total) by mouth every 3 (three) hours as  needed for moderate pain or severe pain. 40 tablet 0  . megestrol (MEGACE) 40 MG/ML suspension TAKE 5 MLS BY MOUTH ONCE DAILY  0  . nicotine (NICODERM CQ - DOSED IN MG/24 HOURS) 21 mg/24hr patch Place 1 patch (21 mg total) onto the skin daily. 28 patch 0  . nystatin (MYCOSTATIN) 100000 UNIT/ML suspension Take 5 mLs (500,000 Units total) by mouth 4 (four) times daily. 60 mL 1  . OXYCONTIN 15 MG 12 hr tablet Take 15 mg by mouth every 12 (twelve) hours.  0  . QUEtiapine (SEROQUEL) 25 MG tablet Take 1 tablet (25 mg total) by mouth at bedtime. 15 tablet 0  . senna (SENOKOT) 8.6 MG TABS tablet Take 2 tablets (17.2 mg total) by mouth at bedtime. 30 each 1  . traMADol (ULTRAM) 50 MG tablet Take 50 mg by mouth every 6 (six) hours as needed.  0  . furosemide (LASIX) 20 MG tablet Take 1 tablet (20 mg total) by mouth daily. 30 tablet 0  . potassium chloride SA (K-DUR,KLOR-CON) 20 MEQ tablet Take 1 tablet (20 mEq total) by mouth daily. 30 tablet 0   No current facility-administered medications for this visit.    Review of Systems:  GENERAL:  Feels worse, memory worse.  No fevers or sweats.   PERFORMANCE STATUS (ECOG):  3 HEENT: Vision s a little blurry (improved s/p radiation). No runny nose, sore throat, mouth sores or tenderness. Lungs: No shortness of breath or cough. No hemoptysis. Cardiac: Chest/rib pain, improved. No palpitations, orthopnea, or PND. GI: Appetite 30%.  Some diarrhea.  No vomiting, constipation, melena or hematochezia. GU: No urgency, frequency, dysuria, or hematuria. Musculoskeletal: Diffuse bone pain (controlled on Dilaudid). No muscle tenderness. Extremities: BLE swelling. Skin: No rashes or skin changes. Neuro: General weakness.  No headache, weakness, balance or coordination issues. Endocrine: No diabetes, thyroid issues, hot flashes or night sweats. Psych: Anxiety. Pain: Diffuse bone pain, controlled. Review of systems: All other systems reviewed and found  to be negative  Physical Exam: Blood pressure 114/75, pulse 109, temperature 98.6 F (37 C), temperature source Tympanic, resp. rate 20, height 5' 4"  (1.626 m). GENERAL: Chronically fatigued appearing woman sitting in a wheelchair in the exam room in no acute distress. MENTAL STATUS: Alert and oriented to person, place and time. HEAD: Wearing a beige cap with strands of hair protruding. Normocephalic, atraumatic, face symmetric, no Cushingoid features. EYES: Brown eyes. Pupils equal round and reactive to light and accomodation.  No conjunctivitis or scleral icterus. ENT: No oral lesions.  Tongue normal.  Mucous membranes moist.  RESPIRATORY: Poor respiratory excursion. No rales or wheezes.Marland Kitchen CARDIOVASCULAR: Regular rate and rhythm without murmur, rub or gallop. CHEST:  Left sided port-a-cath. ABDOMEN: Soft, non-tender, with active bowel sounds, and no appreciable hepatosplenomegaly. No masses. SKIN: No rashes or ulcers. EXTREMITIES: 3+  edema, no skin discoloration or tenderness. No palpable cords. LYMPH NODES: Right posterior neck 2 cm mobile cyst (old). No palpable cervical, supraclavicular, axillary or inguinal adenopathy NEUROLOGICAL: Stable. PSYCH: Appropriate.  No visits with results within 3 Day(s) from this visit. Latest known visit with results is:  Admission on 02/25/2015, Discharged on 03/06/2015  Component Date Value Ref Range Status  . Sodium 02/27/2015 148* 135 - 145 mmol/L Final  . Potassium 02/27/2015 3.6  3.5 - 5.1 mmol/L Final  . Chloride 02/27/2015 112* 101 - 111 mmol/L Final  . CO2 02/27/2015 28  22 - 32 mmol/L Final  . Glucose, Bld 02/27/2015 103* 65 - 99 mg/dL Final  . BUN 02/27/2015 6  6 - 20 mg/dL Final  . Creatinine, Ser 02/27/2015 0.71  0.44 - 1.00 mg/dL Final  . Calcium 02/27/2015 9.6  8.9 - 10.3 mg/dL Final  . GFR calc non Af Amer 02/27/2015 >60  >60 mL/min Final  . GFR calc Af Amer 02/27/2015 >60  >60 mL/min Final   Comment: (NOTE) The  eGFR has been calculated using the CKD EPI equation. This calculation has not been validated in all clinical situations. eGFR's persistently <60 mL/min signify possible Chronic Kidney Disease.   . Anion gap 02/27/2015 8  5 - 15 Final  . Glucose-Capillary 02/27/2015 88  65 - 99 mg/dL Final  . Glucose-Capillary 02/27/2015 105* 65 - 99 mg/dL Final  . Glucose-Capillary 02/27/2015 102* 65 - 99 mg/dL Final  . WBC 02/28/2015 4.3  3.6 - 11.0 K/uL Final  . RBC 02/28/2015 3.98  3.80 - 5.20 MIL/uL Final  . Hemoglobin 02/28/2015 11.1* 12.0 - 16.0 g/dL Final  . HCT 02/28/2015 32.9* 35.0 - 47.0 % Final  . MCV 02/28/2015 82.6  80.0 - 100.0 fL Final  . MCH 02/28/2015 27.9  26.0 - 34.0 pg Final  . MCHC 02/28/2015 33.7  32.0 - 36.0 g/dL Final  . RDW 02/28/2015 17.4* 11.5 - 14.5 % Final  . Platelets 02/28/2015 141* 150 - 440 K/uL Final  . Sodium 03/01/2015 142  135 - 145 mmol/L Final  . Potassium 03/01/2015 3.8  3.5 - 5.1 mmol/L Final  . Chloride 03/01/2015 104  101 - 111 mmol/L Final  . CO2 03/01/2015 30  22 - 32 mmol/L Final  . Glucose, Bld 03/01/2015 128* 65 - 99 mg/dL Final  . BUN 03/01/2015 <5* 6 - 20 mg/dL Final  . Creatinine, Ser 03/01/2015 0.88  0.44 - 1.00 mg/dL Final  . Calcium 03/01/2015 9.6  8.9 - 10.3 mg/dL Final  . GFR calc non Af Amer 03/01/2015 >60  >60 mL/min Final  . GFR calc Af Amer 03/01/2015 >60  >60 mL/min Final   Comment: (NOTE) The eGFR has been calculated using the CKD EPI equation. This calculation has not been validated in all clinical situations. eGFR's persistently <60 mL/min signify possible Chronic Kidney Disease.   . Anion gap 03/01/2015 8  5 - 15 Final  . WBC 03/03/2015 5.7  3.6 - 11.0 K/uL Final  . RBC 03/03/2015 4.01  3.80 - 5.20 MIL/uL Final  . Hemoglobin 03/03/2015 11.2* 12.0 - 16.0 g/dL Final  . HCT 03/03/2015 33.5* 35.0 - 47.0 % Final  . MCV 03/03/2015 83.6  80.0 - 100.0 fL Final  . MCH 03/03/2015 27.9  26.0 - 34.0 pg Final  . MCHC 03/03/2015 33.3  32.0 -  36.0 g/dL Final  . RDW 03/03/2015 19.0* 11.5 - 14.5 % Final  . Platelets 03/03/2015 161  150 - 440 K/uL  Final    Assessment:  CARLA RASHAD is a 65 y.o. female with metastatic small cell lung cancer. She has a greater than 50 pack year smoking history. She presented with a 28 pound weight loss over 2 months, nausea, constipation, diplopia on lateral gaze, and diffuse bone pain.   Chest CT on 02/12/2015 revealed mixed lucent and sclerotic and expansile lesions in the spine, ribs, and sternum.  There was mediastinal lymphadenopathy with bilateral pulmonary nodules (some solid nodules and some patchy ground-glass nodules). There was focal spiculation in the right middle lung.  There was bilateral adrenal gland nodules and skin nodules.I  Abdominal/pelvic CT scan on 11/27/2016revealed a 3.7 x 3.2 x 3.7 cm mass in the cecum with associated ileocolic lymphadenopathy (largest 3.8 cm), metastatic disease to adrenals bilaterally, and a suspicious lesion in segment 5 of the liver. There was widespread osseous metastasis. There were multiple low to intermediate attenuation cystic appearing lesions in the ovaries.  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 CA125 was 38.3 (0-38.1) on 02/15/2015.  Head CT revealed a small ovoid soft tissue lesion within the medial aspect of the left orbit just superior to the medial rectus muscle. Orbital MRI on 02/15/2015 revealed a 7 x10 x 9 mm lesion on the left superior oblique muscle and multiple focal areas of enhancement (cerebellum, left basal ganglia, thalamus, and anteromedial right frontal lobe) concerning for metastatic disease.  Bone scan on 02/15/2015 revealed abnormal uptake throughout the calvarium, ribs, vertebral bodies, pelvis, bilateral femurs and shoulders consistent with metastatic disease. Plain films reveal no evidence of impending fracture.  Colonoscopy on  02/15/2015 revealed a frond-like/villous partially obstructing non-circumferential mass in the cecum. 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 CEA was 333.8 on 02/14/2015.  CT guided right iliac crest biopsy on 02/19/2015 confirmed metastatic small cell carcinoma of the lung.  CK7, TTF1, CD56 were positive.  CK20 and CDX-2 were negative.  She completed cranial radiation for CNS metastasis.  She is no longer in Hospice.  She is interested in pursuing treatment, although she has vacilated several times in the past.  It is unclear if she and her husband have completed paperwork for medical coverage. She expresses desire today to complete reimaging in hopes of being a candidate for chemotherapy.   Husband has started [facility] paperwork but has not completed.    Symptomatically, her pain is well controlled on Dilaudid prn.  She does not want to use any other pain medications.    Plan: 1.  Discuss patient's thoughts about therapy.  Patient wishes to pursue chemotherapy.  Previously discussed chemotherapy (carboplatin and etoposide every 3 weeks for 4-6 cycles).  Discuss side effects of chemotherapy.  Discuss treatment is not curative.   She will need to meet with Dr. Mike Gip, both she and her husband to finalize treatment plan. 2.  Port flush every 6-8 weeks. 3.  RTC in following CT scans for MD meeting with patient and her husband to discuss possible treatment.   Evlyn Kanner, NP  04/29/2015, 11:49 AM

## 2015-05-04 ENCOUNTER — Ambulatory Visit: Payer: Self-pay

## 2015-05-05 ENCOUNTER — Ambulatory Visit: Payer: Self-pay

## 2015-05-06 ENCOUNTER — Ambulatory Visit: Payer: Self-pay

## 2015-05-06 ENCOUNTER — Ambulatory Visit
Admission: RE | Admit: 2015-05-06 | Discharge: 2015-05-06 | Disposition: A | Discharge status: Home / Self Care | Payer: Self-pay | Source: Ambulatory Visit | Attending: Radiation Oncology | Admitting: Radiation Oncology

## 2015-05-06 ENCOUNTER — Inpatient Hospital Stay: Payer: Self-pay

## 2015-05-07 ENCOUNTER — Ambulatory Visit
Admission: RE | Admit: 2015-05-07 | Discharge: 2015-05-07 | Disposition: A | Discharge status: Home / Self Care | Payer: Self-pay | Source: Ambulatory Visit | Attending: Radiation Oncology | Admitting: Radiation Oncology

## 2015-05-07 ENCOUNTER — Ambulatory Visit: Payer: Self-pay

## 2015-05-10 ENCOUNTER — Ambulatory Visit
Admission: RE | Admit: 2015-05-10 | Discharge: 2015-05-10 | Disposition: A | Discharge status: Home / Self Care | Payer: Self-pay | Source: Ambulatory Visit | Attending: Radiation Oncology | Admitting: Radiation Oncology

## 2015-05-11 ENCOUNTER — Ambulatory Visit
Admission: RE | Admit: 2015-05-11 | Discharge: 2015-05-11 | Disposition: A | Discharge status: Home / Self Care | Payer: Self-pay | Source: Ambulatory Visit | Attending: Radiation Oncology | Admitting: Radiation Oncology

## 2015-05-11 ENCOUNTER — Ambulatory Visit: Payer: Self-pay

## 2015-05-11 ENCOUNTER — Ambulatory Visit: Admission: RE | Admit: 2015-05-11 | Payer: Self-pay | Source: Ambulatory Visit

## 2015-05-12 ENCOUNTER — Ambulatory Visit: Payer: Self-pay

## 2015-05-12 ENCOUNTER — Other Ambulatory Visit: Payer: Self-pay | Admitting: *Deleted

## 2015-05-12 ENCOUNTER — Ambulatory Visit
Admission: RE | Admit: 2015-05-12 | Discharge: 2015-05-12 | Disposition: A | Discharge status: Home / Self Care | Payer: Self-pay | Source: Ambulatory Visit | Attending: Radiation Oncology | Admitting: Radiation Oncology

## 2015-05-12 MED ORDER — HYDROMORPHONE HCL 2 MG PO TABS: 1.0000 mg | ORAL_TABLET | ORAL | Status: DC | PRN

## 2015-05-12 MED ORDER — HYDROMORPHONE HCL 2 MG PO TABS: 1.0000 mg | ORAL_TABLET | ORAL | Status: AC | PRN

## 2015-05-12 NOTE — Telephone Encounter (Signed)
Informed that prescription is ready to pick up  

## 2015-05-13 ENCOUNTER — Ambulatory Visit: Payer: Self-pay

## 2015-05-13 ENCOUNTER — Ambulatory Visit
Admission: RE | Admit: 2015-05-13 | Discharge: 2015-05-13 | Disposition: A | Discharge status: Home / Self Care | Payer: Self-pay | Source: Ambulatory Visit | Attending: Radiation Oncology | Admitting: Radiation Oncology

## 2015-05-13 ENCOUNTER — Inpatient Hospital Stay: Payer: Self-pay | Admitting: Hematology and Oncology

## 2015-05-14 ENCOUNTER — Ambulatory Visit
Admission: RE | Admit: 2015-05-14 | Discharge: 2015-05-14 | Disposition: A | Discharge status: Home / Self Care | Payer: Self-pay | Source: Ambulatory Visit | Attending: Radiation Oncology | Admitting: Radiation Oncology

## 2015-05-14 ENCOUNTER — Ambulatory Visit: Payer: Self-pay

## 2015-05-15 ENCOUNTER — Encounter: Payer: Self-pay | Admitting: Emergency Medicine

## 2015-05-15 ENCOUNTER — Emergency Department: Payer: Medicaid Other

## 2015-05-15 ENCOUNTER — Inpatient Hospital Stay
Admission: EM | Admit: 2015-05-15 | Discharge: 2015-06-19 | DRG: 189 | Disposition: E | Payer: Medicaid Other | Attending: Internal Medicine | Admitting: Internal Medicine

## 2015-05-15 DIAGNOSIS — Z923 Personal history of irradiation: Secondary | ICD-10-CM

## 2015-05-15 DIAGNOSIS — E860 Dehydration: Secondary | ICD-10-CM | POA: Diagnosis present

## 2015-05-15 DIAGNOSIS — R1310 Dysphagia, unspecified: Secondary | ICD-10-CM | POA: Diagnosis present

## 2015-05-15 DIAGNOSIS — G893 Neoplasm related pain (acute) (chronic): Secondary | ICD-10-CM | POA: Diagnosis present

## 2015-05-15 DIAGNOSIS — E340 Carcinoid syndrome: Secondary | ICD-10-CM | POA: Diagnosis present

## 2015-05-15 DIAGNOSIS — J9601 Acute respiratory failure with hypoxia: Secondary | ICD-10-CM | POA: Diagnosis not present

## 2015-05-15 DIAGNOSIS — C3490 Malignant neoplasm of unspecified part of unspecified bronchus or lung: Secondary | ICD-10-CM | POA: Diagnosis present

## 2015-05-15 DIAGNOSIS — F17210 Nicotine dependence, cigarettes, uncomplicated: Secondary | ICD-10-CM | POA: Diagnosis present

## 2015-05-15 DIAGNOSIS — G9340 Encephalopathy, unspecified: Secondary | ICD-10-CM | POA: Diagnosis present

## 2015-05-15 DIAGNOSIS — D61818 Other pancytopenia: Secondary | ICD-10-CM | POA: Diagnosis present

## 2015-05-15 DIAGNOSIS — Z66 Do not resuscitate: Secondary | ICD-10-CM | POA: Diagnosis present

## 2015-05-15 DIAGNOSIS — E43 Unspecified severe protein-calorie malnutrition: Secondary | ICD-10-CM | POA: Diagnosis present

## 2015-05-15 DIAGNOSIS — Z800 Family history of malignant neoplasm of digestive organs: Secondary | ICD-10-CM | POA: Diagnosis not present

## 2015-05-15 DIAGNOSIS — Z79818 Long term (current) use of other agents affecting estrogen receptors and estrogen levels: Secondary | ICD-10-CM

## 2015-05-15 DIAGNOSIS — C7931 Secondary malignant neoplasm of brain: Secondary | ICD-10-CM | POA: Diagnosis present

## 2015-05-15 DIAGNOSIS — I959 Hypotension, unspecified: Secondary | ICD-10-CM | POA: Diagnosis present

## 2015-05-15 DIAGNOSIS — C7970 Secondary malignant neoplasm of unspecified adrenal gland: Secondary | ICD-10-CM | POA: Diagnosis present

## 2015-05-15 DIAGNOSIS — R0902 Hypoxemia: Secondary | ICD-10-CM

## 2015-05-15 DIAGNOSIS — C7951 Secondary malignant neoplasm of bone: Secondary | ICD-10-CM | POA: Diagnosis present

## 2015-05-15 DIAGNOSIS — E876 Hypokalemia: Secondary | ICD-10-CM | POA: Diagnosis present

## 2015-05-15 DIAGNOSIS — Z8249 Family history of ischemic heart disease and other diseases of the circulatory system: Secondary | ICD-10-CM

## 2015-05-15 DIAGNOSIS — F329 Major depressive disorder, single episode, unspecified: Secondary | ICD-10-CM | POA: Diagnosis present

## 2015-05-15 DIAGNOSIS — Z79899 Other long term (current) drug therapy: Secondary | ICD-10-CM

## 2015-05-15 DIAGNOSIS — Z515 Encounter for palliative care: Secondary | ICD-10-CM | POA: Diagnosis present

## 2015-05-15 DIAGNOSIS — D638 Anemia in other chronic diseases classified elsewhere: Secondary | ICD-10-CM | POA: Diagnosis present

## 2015-05-15 DIAGNOSIS — Z7401 Bed confinement status: Secondary | ICD-10-CM | POA: Diagnosis not present

## 2015-05-15 DIAGNOSIS — E870 Hyperosmolality and hypernatremia: Secondary | ICD-10-CM | POA: Diagnosis present

## 2015-05-15 DIAGNOSIS — Z79891 Long term (current) use of opiate analgesic: Secondary | ICD-10-CM

## 2015-05-15 DIAGNOSIS — N179 Acute kidney failure, unspecified: Secondary | ICD-10-CM | POA: Diagnosis present

## 2015-05-15 DIAGNOSIS — F419 Anxiety disorder, unspecified: Secondary | ICD-10-CM | POA: Diagnosis present

## 2015-05-15 HISTORY — DX: Malignant neoplasm of unspecified part of unspecified bronchus or lung: C34.90

## 2015-05-15 LAB — COMPREHENSIVE METABOLIC PANEL
ALBUMIN: 2.7 g/dL — AB (ref 3.5–5.0)
ALK PHOS: 126 U/L (ref 38–126)
ALT: 16 U/L (ref 14–54)
ANION GAP: 11 (ref 5–15)
AST: 56 U/L — ABNORMAL HIGH (ref 15–41)
BILIRUBIN TOTAL: 2 mg/dL — AB (ref 0.3–1.2)
BUN: 50 mg/dL — ABNORMAL HIGH (ref 6–20)
CALCIUM: 10.8 mg/dL — AB (ref 8.9–10.3)
CO2: 32 mmol/L (ref 22–32)
Chloride: 115 mmol/L — ABNORMAL HIGH (ref 101–111)
Creatinine, Ser: 1.58 mg/dL — ABNORMAL HIGH (ref 0.44–1.00)
GFR calc non Af Amer: 34 mL/min — ABNORMAL LOW (ref >60)
GFR, EST AFRICAN AMERICAN: 39 mL/min — AB (ref >60)
GLUCOSE: 177 mg/dL — AB (ref 65–99)
POTASSIUM: 3.3 mmol/L — AB (ref 3.5–5.1)
Sodium: 158 mmol/L — ABNORMAL HIGH (ref 135–145)
TOTAL PROTEIN: 6.3 g/dL — AB (ref 6.5–8.1)

## 2015-05-15 LAB — URINALYSIS COMPLETE WITH MICROSCOPIC (ARMC ONLY)
BILIRUBIN URINE: NEGATIVE
GLUCOSE, UA: NEGATIVE mg/dL
KETONES UR: NEGATIVE mg/dL
NITRITE: NEGATIVE
PH: 5 (ref 5.0–8.0)
Protein, ur: NEGATIVE mg/dL
Specific Gravity, Urine: 1.014 (ref 1.005–1.030)

## 2015-05-15 LAB — CBC WITH DIFFERENTIAL/PLATELET
BASOS PCT: 0 %
Basophils Absolute: 0 10*3/uL (ref 0–0.1)
EOS ABS: 0 10*3/uL (ref 0–0.7)
EOS PCT: 0 %
HEMATOCRIT: 30.3 % — AB (ref 35.0–47.0)
Hemoglobin: 9.9 g/dL — ABNORMAL LOW (ref 12.0–16.0)
LYMPHS PCT: 19 %
Lymphs Abs: 1 10*3/uL (ref 1.0–3.6)
MCH: 30.8 pg (ref 26.0–34.0)
MCHC: 32.6 g/dL (ref 32.0–36.0)
MCV: 94.5 fL (ref 80.0–100.0)
MONO ABS: 0.5 10*3/uL (ref 0.2–0.9)
Monocytes Relative: 9 %
NEUTROS ABS: 3.8 10*3/uL (ref 1.4–6.5)
NEUTROS PCT: 72 %
PLATELETS: 179 10*3/uL (ref 150–440)
RBC: 3.21 MIL/uL — ABNORMAL LOW (ref 3.80–5.20)
RDW: 20.5 % — AB (ref 11.5–14.5)
WBC: 5.4 10*3/uL (ref 3.6–11.0)

## 2015-05-15 LAB — AMMONIA: Ammonia: 45 umol/L — ABNORMAL HIGH (ref 9–35)

## 2015-05-15 LAB — LACTIC ACID, PLASMA
Lactic Acid, Venous: 1.5 mmol/L (ref 0.5–2.0)
Lactic Acid, Venous: 1.1 mmol/L (ref 0.5–2.0)

## 2015-05-15 LAB — FIBRIN DERIVATIVES D-DIMER (ARMC ONLY): FIBRIN DERIVATIVES D-DIMER (ARMC): 988 — AB (ref 0–499)

## 2015-05-15 LAB — BRAIN NATRIURETIC PEPTIDE: B NATRIURETIC PEPTIDE 5: 570 pg/mL — AB (ref 0.0–100.0)

## 2015-05-15 LAB — TROPONIN I: TROPONIN I: 0.31 ng/mL — AB (ref <0.031)

## 2015-05-15 MED ORDER — IPRATROPIUM-ALBUTEROL 0.5-2.5 (3) MG/3ML IN SOLN
3.0000 mL | Freq: Once | RESPIRATORY_TRACT | Status: AC
  Administered 2015-05-15: 3 mL via RESPIRATORY_TRACT
  Filled 2015-05-15: qty 3

## 2015-05-15 MED ORDER — POTASSIUM CHLORIDE 20 MEQ/15ML (10%) PO SOLN
10.0000 meq | Freq: Once | ORAL | Status: AC
  Administered 2015-05-15: 10 meq via ORAL
  Filled 2015-05-15: qty 7.5

## 2015-05-15 MED ORDER — DIAZEPAM 2 MG PO TABS
2.0000 mg | ORAL_TABLET | Freq: Two times a day (BID) | ORAL | Status: DC | PRN
  Administered 2015-05-17: 03:00:00 2 mg via ORAL
  Filled 2015-05-15: qty 1

## 2015-05-15 MED ORDER — ENOXAPARIN SODIUM 40 MG/0.4ML ~~LOC~~ SOLN: 40.0000 mg | SUBCUTANEOUS | Status: DC

## 2015-05-15 MED ORDER — DEXTROSE 5 % IV SOLN
INTRAVENOUS | Status: DC
  Administered 2015-05-15: 23:00:00 via INTRAVENOUS

## 2015-05-15 MED ORDER — ENOXAPARIN SODIUM 30 MG/0.3ML ~~LOC~~ SOLN
30.0000 mg | SUBCUTANEOUS | Status: DC
  Administered 2015-05-16 – 2015-05-17 (×2): 30 mg via SUBCUTANEOUS
  Filled 2015-05-15 (×2): qty 0.3

## 2015-05-15 MED ORDER — SODIUM CHLORIDE 0.9 % IV BOLUS (SEPSIS)
1000.0000 mL | Freq: Once | INTRAVENOUS | Status: AC
  Administered 2015-05-15: 1000 mL via INTRAVENOUS

## 2015-05-15 MED ORDER — ACETAMINOPHEN 650 MG RE SUPP: 650.0000 mg | Freq: Four times a day (QID) | RECTAL | Status: DC | PRN

## 2015-05-15 MED ORDER — QUETIAPINE FUMARATE 25 MG PO TABS
25.0000 mg | ORAL_TABLET | Freq: Every day | ORAL | Status: DC
  Administered 2015-05-15 – 2015-05-17 (×2): 25 mg via ORAL
  Filled 2015-05-15 (×2): qty 1

## 2015-05-15 MED ORDER — HYDROMORPHONE HCL 2 MG PO TABS: 1.0000 mg | ORAL_TABLET | ORAL | Status: DC | PRN

## 2015-05-15 MED ORDER — ACETAMINOPHEN 325 MG PO TABS: 650.0000 mg | ORAL_TABLET | Freq: Four times a day (QID) | ORAL | Status: DC | PRN

## 2015-05-15 MED ORDER — IOHEXOL 350 MG/ML SOLN
50.0000 mL | Freq: Once | INTRAVENOUS | Status: AC | PRN
  Administered 2015-05-15: 50 mL via INTRAVENOUS

## 2015-05-15 MED ORDER — NYSTATIN 100000 UNIT/ML MT SUSP
5.0000 mL | Freq: Four times a day (QID) | OROMUCOSAL | Status: DC
  Administered 2015-05-15 – 2015-05-18 (×9): 500000 [IU] via ORAL
  Filled 2015-05-15 (×9): qty 5

## 2015-05-15 NOTE — ED Notes (Signed)
Spoke with MD regarding patient's code sepsis status and meeting code sepsis criteria. Per MD no code sepsis to be called at this time.

## 2015-05-15 NOTE — ED Notes (Signed)
This RN, and Bonnita Nasuti, RN accessed patient's portacath. Pt tolerated procedure well. No change in patient condition.

## 2015-05-15 NOTE — ED Notes (Signed)
Unable to draw lab d/t CT

## 2015-05-15 NOTE — H&P (Signed)
[facility] [place] NAME: Brandy Odom    MR#:  [id number]  [place] OF BIRTH:  [birth year]  DATE OF ADMISSION:  05/08/2015  PRIMARY CARE PHYSICIAN: Nolon Stalls  REQUESTING/REFERRING PHYSICIAN: Dr. Meade Maw  CHIEF COMPLAINT:   Chief Complaint  Patient presents with  . Weakness  . Altered Mental Status    HISTORY OF PRESENT ILLNESS:  Brandy Odom  is a 65 y.o. female brought in with not eating for the past 2 weeks and incoherent. The husband states that he's been giving the dye loaded one pill every 6 hours for 2 mg pill because he is having trouble splitting it to a half a pill every 3 hours. The patient is too weak to get out of bed. The patient is able to answer questions. Most the history obtained from husband at the bedside. The patient does have some shortness of breath and cough and abdominal pain and chest pain.  PAST MEDICAL HISTORY:   Past Medical History  Diagnosis Date  . Tobacco abuse   . Muscle spasm   . 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 Lung cancer, primary, with metastasis from lung to other site [facility])     PAST SURGICAL HISTORY:   Past Surgical History  Procedure Laterality Date  . Appendectomy    . Colonoscopy with propofol N/A 02/15/2015    Procedure: COLONOSCOPY WITH PROPOFOL;  Surgeon: Lucilla Lame, MD;  Location: [facility] ENDOSCOPY;  Service: Endoscopy;  Laterality: N/A;  . Portacath placement N/A 03/03/2015    Procedure: INSERTION PORT-A-CATH;  Surgeon: Florene Glen, MD;  Location: [facility] ORS;  Service: General;  Laterality: N/A;    SOCIAL HISTORY:   Social History  Substance Use Topics  . Smoking status: Current Every Day Smoker    Types: Cigarettes  . Smokeless tobacco: Not on file  . Alcohol Use: No    FAMILY HISTORY:   Family History  Problem Relation Age of Onset  . Colon cancer    . Healthy Mother   . Hypertension Father     DRUG ALLERGIES:   Allergies   Allergen Reactions  . Bee Venom Anaphylaxis  . Naproxen Nausea And Vomiting  . Sulfa Antibiotics Other (See Comments)    Reaction:  Unknown   . Antihistamines, Chlorpheniramine-Type Palpitations    REVIEW OF SYSTEMS:  CONSTITUTIONAL: Positive for hot cold feeling. Positive for weight loss. Positive for fatigue. EYES: No blurred or double vision.  EARS, NOSE, AND THROAT: No tinnitus or ear pain. No sore throat. Decreased hearing. Positive for dysphagia RESPIRATORY: Positive for cough and shortness of breath, no wheezing or hemoptysis.  CARDIOVASCULAR: Positive for chest pain.  GASTROINTESTINAL: No nausea, vomiting. Positive for diarrhea and abdominal pain. No blood in the bowel movements. GENITOURINARY: Positive for dysuria, no hematuria.  ENDOCRINE: No polyuria, nocturia,  HEMATOLOGY: No anemia, easy bruising or bleeding SKIN: No rash or lesion. MUSCULOSKELETAL: No joint pain or arthritis.   NEUROLOGIC: No tingling, numbness, weakness.  PSYCHIATRY: No anxiety or depression.   MEDICATIONS AT HOME:   Prior to Admission medications   Medication Sig Start Date End Date Taking? Authorizing Provider  acetaminophen (TYLENOL) 325 MG tablet Take 2 tablets (650 mg total) by mouth every 4 (four) hours as needed for mild pain, fever or headache (or Fever >/= 101). 03/05/15   Colleen Can, MD  bisacodyl (DULCOLAX) 10 MG suppository Place 1 suppository (10 mg total) rectally as needed  for severe constipation. 03/05/15   Colleen Can, MD  diazepam (VALIUM) 10 MG tablet Take 0.5-1 tablets (5-10 mg total) by mouth every 8 (eight) hours as needed. 03/24/15   Lequita Asal, MD  furosemide (LASIX) 20 MG tablet Take 1 tablet (20 mg total) by mouth daily. 04/29/15   Evlyn Kanner, NP  HYDROmorphone (DILAUDID) 2 MG tablet Take 0.5 tablets (1 mg total) by mouth every 3 (three) hours as needed for moderate pain or severe pain. 05/12/15   Evlyn Kanner, NP  megestrol (MEGACE) 40 MG/ML  suspension TAKE 5 MLS BY MOUTH ONCE DAILY 03/06/15   Historical Provider, MD  nicotine (NICODERM CQ - DOSED IN MG/24 HOURS) 21 mg/24hr patch Place 1 patch (21 mg total) onto the skin daily. 02/19/15   Bettey Costa, MD  nystatin (MYCOSTATIN) 100000 UNIT/ML suspension Take 5 mLs (500,000 Units total) by mouth 4 (four) times daily. 03/11/15   Lequita Asal, MD  OXYCONTIN 15 MG 12 hr tablet Take 15 mg by mouth every 12 (twelve) hours. 03/06/15   Historical Provider, MD  potassium chloride SA (K-DUR,KLOR-CON) 20 MEQ tablet Take 1 tablet (20 mEq total) by mouth daily. 04/29/15   Evlyn Kanner, NP  QUEtiapine (SEROQUEL) 25 MG tablet Take 1 tablet (25 mg total) by mouth at bedtime. 03/05/15   Colleen Can, MD  senna (SENOKOT) 8.6 MG TABS tablet Take 2 tablets (17.2 mg total) by mouth at bedtime. 03/05/15   Colleen Can, MD    As per the husband, she is only taking Dilaudid at home  VITAL SIGNS:  Blood pressure 97/56, pulse 99, temperature 97.8 F (36.6 C), temperature source Oral, resp. rate 24, height '5\' 4"'$  (1.626 m), weight 46.494 kg (102 lb 8 oz), SpO2 100 %.  PHYSICAL EXAMINATION:  GENERAL:  65 y.o.-year-old patient lying in the bed, looking ill.  EYES: Pupils equal, round, reactive to light and accommodation. No scleral icterus. Extraocular muscles intact.  HEENT: Head atraumatic, normocephalic. Oropharynx and nasopharynx clear. Mouth very dry. NECK:  Supple, no jugular venous distention. No thyroid enlargement, no tenderness.  LUNGS: Decreased breath sounds bilaterally, no wheezing, rales, positive rhonchi throughout entire lung field. No use of accessory muscles of respiration.  CARDIOVASCULAR: S1, S2 normal. 2/6 systolic murmurs, no rubs, or gallops.  ABDOMEN: Soft, nontender, nondistended. Bowel sounds present. No organomegaly or mass.  EXTREMITIES: 2+ pedal edema, no cyanosis, or clubbing.  NEUROLOGIC: Cranial nerves II through XII are intact. Muscle strength 5/5 in all  extremities. Sensation intact. Gait not checked.  PSYCHIATRIC: The patient is alert.  SKIN: No ulcers anteriorly  LABORATORY PANEL:   CBC  Recent Labs Lab 05/07/2015 1625  WBC 5.4  HGB 9.9*  HCT 30.3*  PLT 179   ------------------------------------------------------------------------------------------------------------------  Chemistries   Recent Labs Lab 04/29/2015 1625  NA 158*  K 3.3*  CL 115*  CO2 32  GLUCOSE 177*  BUN 50*  CREATININE 1.58*  CALCIUM 10.8*  AST 56*  ALT 16  ALKPHOS 126  BILITOT 2.0*   ------------------------------------------------------------------------------------------------------------------  Cardiac Enzymes  Recent Labs Lab 04/30/2015 1625  TROPONINI 0.31*   ------------------------------------------------------------------------------------------------------------------  RADIOLOGY:  Ct Head Wo Contrast  04/30/2015  CLINICAL DATA:  Altered mental status, known colonic mass with metastatic disease EXAM: CT HEAD WITHOUT CONTRAST TECHNIQUE: Contiguous axial images were obtained from the base of the skull through the vertex without intravenous contrast. COMPARISON:  None. FINDINGS: Bony calvarium demonstrates evidence of multiple lytic lesions consistent with metastatic disease.  These are similar to that seen on the prior exam. Mild atrophic changes are noted. No findings to suggest acute hemorrhage, acute infarction or space-occupying mass lesion are noted. IMPRESSION: Calvarial metastatic disease. No acute intracranial abnormality is noted. Electronically Signed   By: Inez Catalina M.D.   On: 05/05/2015 18:52   Ct Angio Chest Pe W/cm &/or Wo Cm  04/27/2015  CLINICAL DATA:  Hypoxia, history of lung carcinoma EXAM: CT ANGIOGRAPHY CHEST WITH CONTRAST TECHNIQUE: Multidetector CT imaging of the chest was performed using the standard protocol during bolus administration of intravenous contrast. Multiplanar CT image reconstructions and MIPs were obtained  to evaluate the vascular anatomy. CONTRAST:  69m OMNIPAQUE IOHEXOL 350 MG/ML SOLN COMPARISON:  02/12/2015 FINDINGS: Lungs are well aerated bilaterally. Patchy nodular changes are noted in the left lower lobe with associated left lower lobe consolidation. These changes are new from the recent exam and likely represents some progression in the degree of metastatic disease. 10 mm nodule is noted in the right lower lobe medially new from the prior exam. This is best seen on image number 81 of series 6. Sulcal likely without represent some progressive metastatic disease. A subpleural nodule is noted on image number 26 and 27 of series 6 also likely representing metastatic disease. Mediastinal lymphadenopathy is noted as well as some small supraclavicular lymph nodes. A right peritracheal lymph node is noted which now measures 17 mm in short axis. This is increased in size from the prior exam at which time it measured 12 mm. Right peritracheal adenopathy and hilar adenopathy is noted inferiorly which has increased somewhat in the interval from the prior exam. It encompasses the upper lobe pulmonary arterial branches although no occlusive changes are seen. Stable subcarinal adenopathy is noted. Multiple bilateral axillary lymph nodes are seen which are new from the prior exam. The largest of these lies on the left and measures 14 mm in short axis. This is best seen on image number 50 of series 5. Retrocrural lymphadenopathy is noted at the level of the aortic hiatus. These measure 12 mm in greatest dimension and are significantly increased from 5 mm on the prior exam. This is also consistent with progressive metastatic disease. Aortic calcifications are noted without aneurysmal dilatation. No pulmonary emboli are noted. Bilateral adrenal enlargement is noted consistent with metastatic disease. The remainder the upper abdomen is within normal limits. Multiple mixed lytic and sclerotic lesions are noted throughout the spine  as well as within the rib cage and proximal left humerus consistent with metastatic disease. Review of the MIP images confirms the above findings. IMPRESSION: No evidence of pulmonary emboli. Progression in the degree of both lung parenchymal nodularity as well as mediastinal adenopathy and axillary adenopathy consistent with metastatic disease Diffuse bony metastatic disease. Stable metastatic disease to the adrenal glands bilaterally. Electronically Signed   By: MInez CatalinaM.D.   On: 05/17/2015 19:44   Dg Chest Port 1 View  05/09/2015  CLINICAL DATA:  Shortness of Breath EXAM: PORTABLE CHEST 1 VIEW COMPARISON:  03/03/2015 FINDINGS: Left Port-A-Cath remains in place, unchanged. Heart is normal size. Linear scarring or atelectasis at the left base. Right lung is clear. No effusions or acute bony abnormality. IMPRESSION: Left basilar scarring or atelectasis.  No active disease. Electronically Signed   By: KRolm BaptiseM.D.   On: 04/23/2015 17:04    EKG:   Sinus tachycardia 99 bpm  IMPRESSION AND PLAN:   1. Acute respiratory failure with hypoxia. Pulse ox 70% on  room air. Continue oxygen supplementation. CT angios the chest was negative for pulmonary embolism but did see worsening metastatic disease. 2. Severe hypernatremia. Overall prognosis is poor when the sodium is this high. The patient was given an IV fluid bolus in the emergency room. I will switch IV fluids over to D5W at 100 cc per hour. If the patient doesn't eat her sodium will continue to be high. She will be a good candidate for the hospice home. 3. 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 I think patient will be a good candidate for the hospice home. 4. Chronic pain syndrome continue Dilantin 5. Acute renal failure- monitor with IV fluid hydration 6. Hypokalemia replace potassium via IV 7. Anemia of chronic disease 8. Tobacco abuse. Patient only smoking one to see his cigarettes a day at this point. No need  for nicotine patch. 9. Hypercalcemia this is secondary to dehydration  All the records are reviewed and case discussed with ED provider. Management plans discussed with the patient, family and they are in agreement.  CODE STATUS: DO NOT RESUSCITATE  TOTAL TIME TAKING CARE OF THIS PATIENT: 50 minutes.    Loletha Grayer M.D on 05/05/2015 at 8:59 PM  Between 7am to 6pm - Pager - [phone]  After 6pm call admission pager [facility]  Office  [phone]  CC: Primary care physician; Nolon Stalls

## 2015-05-15 NOTE — ED Notes (Signed)
Marcelene Butte, MD informed of critical lab value.

## 2015-05-15 NOTE — ED Notes (Signed)
Pt placed on 6L via [place] at this time. Pt maintaining O2 sats of 97-100%.

## 2015-05-15 NOTE — ED Notes (Signed)
Patient transported to CT 

## 2015-05-15 NOTE — ED Notes (Signed)
Pt presents from home via [facility]. Per EMS pt has a hx of stage IV brain, lung, and bone cancer. EMS states that per husband pt has had decreased appetite x 2 weeks. EMS states husband gave Dilaudid for pain just prior to EMS arrival. EMS states that patient has missed last 2 weeks of radiation due to not feeling good and presents as confused at this time. EMS states on their arrival patient was in the 70's for O2 saturation. States that when they arrived patient was laying on her back in the bed making a "gurgling sound". EMS states hypotension on their arrival.

## 2015-05-16 LAB — CBC
HCT: 22 % — ABNORMAL LOW (ref 35.0–47.0)
Hemoglobin: 7.3 g/dL — ABNORMAL LOW (ref 12.0–16.0)
MCH: 30.8 pg (ref 26.0–34.0)
MCHC: 33 g/dL (ref 32.0–36.0)
MCV: 93.2 fL (ref 80.0–100.0)
PLATELETS: 126 10*3/uL — AB (ref 150–440)
RBC: 2.36 MIL/uL — AB (ref 3.80–5.20)
RDW: 19.7 % — ABNORMAL HIGH (ref 11.5–14.5)
WBC: 2.5 10*3/uL — ABNORMAL LOW (ref 3.6–11.0)

## 2015-05-16 LAB — BASIC METABOLIC PANEL
Anion gap: 7 (ref 5–15)
BUN: 46 mg/dL — AB (ref 6–20)
CALCIUM: 9.8 mg/dL (ref 8.9–10.3)
CHLORIDE: 119 mmol/L — AB (ref 101–111)
CO2: 32 mmol/L (ref 22–32)
CREATININE: 1.49 mg/dL — AB (ref 0.44–1.00)
GFR calc Af Amer: 42 mL/min — ABNORMAL LOW (ref >60)
GFR calc non Af Amer: 36 mL/min — ABNORMAL LOW (ref >60)
GLUCOSE: 153 mg/dL — AB (ref 65–99)
Potassium: 3.2 mmol/L — ABNORMAL LOW (ref 3.5–5.1)
Sodium: 158 mmol/L — ABNORMAL HIGH (ref 135–145)

## 2015-05-16 LAB — SODIUM: Sodium: 158 mmol/L — ABNORMAL HIGH (ref 135–145)

## 2015-05-16 MED ORDER — POTASSIUM CL IN DEXTROSE 5% 20 MEQ/L IV SOLN
20.0000 meq | INTRAVENOUS | Status: DC
  Administered 2015-05-16 – 2015-05-18 (×5): 20 meq via INTRAVENOUS
  Filled 2015-05-16 (×8): qty 1000

## 2015-05-16 MED ORDER — SODIUM CHLORIDE 0.9 % IV BOLUS (SEPSIS)
500.0000 mL | Freq: Once | INTRAVENOUS | Status: AC
  Administered 2015-05-16: 500 mL via INTRAVENOUS

## 2015-05-16 MED ORDER — PNEUMOCOCCAL VAC POLYVALENT 25 MCG/0.5ML IJ INJ
0.5000 mL | INJECTION | INTRAMUSCULAR | Status: AC
  Administered 2015-05-17: 0.5 mL via INTRAMUSCULAR
  Filled 2015-05-16: qty 0.5

## 2015-05-16 MED ORDER — INFLUENZA VAC SPLIT QUAD 0.5 ML IM SUSY
0.5000 mL | PREFILLED_SYRINGE | INTRAMUSCULAR | Status: AC
  Administered 2015-05-17: 11:00:00 0.5 mL via INTRAMUSCULAR
  Filled 2015-05-16: qty 0.5

## 2015-05-16 MED ORDER — FUROSEMIDE 10 MG/ML IJ SOLN
60.0000 mg | Freq: Once | INTRAMUSCULAR | Status: AC
  Administered 2015-05-16: 04:00:00 60 mg via INTRAVENOUS
  Filled 2015-05-16: qty 6

## 2015-05-16 NOTE — Progress Notes (Signed)
Dr. Marcille Blanco notified of patient's BP 88/56. New orders to be entered by MD.

## 2015-05-16 NOTE — Progress Notes (Signed)
[facility] [place] NAME: Ruari Duggan    MR#:    [id number]  DATE OF BIRTH:  09-11-1950  SUBJECTIVE:    Patient is confused Not very coherent  REVIEW OF SYSTEMS:    Review of Systems  Unable to perform ROS   Tolerating Diet: yes      DRUG ALLERGIES:   Allergies  Allergen Reactions  . Bee Venom Anaphylaxis  . Naproxen Nausea And Vomiting  . Sulfa Antibiotics Other (See Comments)    Reaction:  Unknown   . Antihistamines, Chlorpheniramine-Type Palpitations    VITALS:  Blood pressure 97/49, pulse 101, temperature 98.2 F (36.8 C), temperature source Oral, resp. rate 24, height '5\' 4"'$  (1.626 m), weight 46.494 kg (102 lb 8 oz), SpO2 96 %.  PHYSICAL EXAMINATION:   Physical Exam  Constitutional: She is well-developed, well-nourished, and in no distress. No distress.  Frail appearing and lethargic  HENT:  Head: Normocephalic.  Eyes: No scleral icterus.  Neck: Neck supple. No JVD present. No tracheal deviation present. No thyromegaly present.  Cardiovascular: Regular rhythm and normal heart sounds.  Exam reveals no gallop and no friction rub.   No murmur heard. Tachycardic  Pulmonary/Chest: Effort normal and breath sounds normal. No respiratory distress. She has no wheezes. She has no rales. She exhibits no tenderness.  Abdominal: Soft. Bowel sounds are normal. She exhibits no distension and no mass. There is no tenderness. There is no rebound and no guarding.  Musculoskeletal: Normal range of motion. She exhibits no edema.  Generalized weakness  Neurological: She is alert.  Oriented only to name and place  Skin: Skin is warm. No rash noted. No erythema.      LABORATORY PANEL:   CBC  Recent Labs Lab 05/16/15 0445  WBC 2.5*  HGB 7.3*  HCT 22.0*  PLT 126*   ------------------------------------------------------------------------------------------------------------------  Chemistries   Recent Labs Lab  04/27/2015 1625 05/16/15 0445  NA 158* 158*  K 3.3* 3.2*  CL 115* 119*  CO2 32 32  GLUCOSE 177* 153*  BUN 50* 46*  CREATININE 1.58* 1.49*  CALCIUM 10.8* 9.8  AST 56*  --   ALT 16  --   ALKPHOS 126  --   BILITOT 2.0*  --    ------------------------------------------------------------------------------------------------------------------  Cardiac Enzymes  Recent Labs Lab 05/04/2015 1625  TROPONINI 0.31*   ------------------------------------------------------------------------------------------------------------------  RADIOLOGY:  Ct Head Wo Contrast  04/22/2015  CLINICAL DATA:  Altered mental status, known colonic mass with metastatic disease EXAM: CT HEAD WITHOUT CONTRAST TECHNIQUE: Contiguous axial images were obtained from the base of the skull through the vertex without intravenous contrast. COMPARISON:  None. FINDINGS: Bony calvarium demonstrates evidence of multiple lytic lesions consistent with metastatic disease. These are similar to that seen on the prior exam. Mild atrophic changes are noted. No findings to suggest acute hemorrhage, acute infarction or space-occupying mass lesion are noted. IMPRESSION: Calvarial metastatic disease. No acute intracranial abnormality is noted. Electronically Signed   By: Inez Catalina M.D.   On: 04/28/2015 18:52   Ct Angio Chest Pe W/cm &/or Wo Cm  04/27/2015  CLINICAL DATA:  Hypoxia, history of lung carcinoma EXAM: CT ANGIOGRAPHY CHEST WITH CONTRAST TECHNIQUE: Multidetector CT imaging of the chest was performed using the standard protocol during bolus administration of intravenous contrast. Multiplanar CT image reconstructions and MIPs were obtained to evaluate the vascular anatomy. CONTRAST:  33m OMNIPAQUE IOHEXOL 350 MG/ML SOLN COMPARISON:  02/12/2015 FINDINGS: Lungs are well aerated bilaterally. Patchy nodular  changes are noted in the left lower lobe with associated left lower lobe consolidation. These changes are new from the recent exam and  likely represents some progression in the degree of metastatic disease. 10 mm nodule is noted in the right lower lobe medially new from the prior exam. This is best seen on image number 81 of series 6. Sulcal likely without represent some progressive metastatic disease. A subpleural nodule is noted on image number 26 and 27 of series 6 also likely representing metastatic disease. Mediastinal lymphadenopathy is noted as well as some small supraclavicular lymph nodes. A right peritracheal lymph node is noted which now measures 17 mm in short axis. This is increased in size from the prior exam at which time it measured 12 mm. Right peritracheal adenopathy and hilar adenopathy is noted inferiorly which has increased somewhat in the interval from the prior exam. It encompasses the upper lobe pulmonary arterial branches although no occlusive changes are seen. Stable subcarinal adenopathy is noted. Multiple bilateral axillary lymph nodes are seen which are new from the prior exam. The largest of these lies on the left and measures 14 mm in short axis. This is best seen on image number 50 of series 5. Retrocrural lymphadenopathy is noted at the level of the aortic hiatus. These measure 12 mm in greatest dimension and are significantly increased from 5 mm on the prior exam. This is also consistent with progressive metastatic disease. Aortic calcifications are noted without aneurysmal dilatation. No pulmonary emboli are noted. Bilateral adrenal enlargement is noted consistent with metastatic disease. The remainder the upper abdomen is within normal limits. Multiple mixed lytic and sclerotic lesions are noted throughout the spine as well as within the rib cage and proximal left humerus consistent with metastatic disease. Review of the MIP images confirms the above findings. IMPRESSION: No evidence of pulmonary emboli. Progression in the degree of both lung parenchymal nodularity as well as mediastinal adenopathy and axillary  adenopathy consistent with metastatic disease Diffuse bony metastatic disease. Stable metastatic disease to the adrenal glands bilaterally. Electronically Signed   By: Inez Catalina M.D.   On: 05/17/2015 19:44   Dg Chest Port 1 View  05/06/2015  CLINICAL DATA:  Shortness of Breath EXAM: PORTABLE CHEST 1 VIEW COMPARISON:  03/03/2015 FINDINGS: Left Port-A-Cath remains in place, unchanged. Heart is normal size. Linear scarring or atelectasis at the left base. Right lung is clear. No effusions or acute bony abnormality. IMPRESSION: Left basilar scarring or atelectasis.  No active disease. Electronically Signed   By: Rolm Baptise M.D.   On: 05/10/2015 17:04     ASSESSMENT AND PLAN:   65 year old female with a history of small cell lung cancer with metastatic disease who presents with encephalopathy And generalized weakness.   1. Acute respiratory failure with hypoxia: CT chest negative for PE but does show worsening metastatic disease which is likely etiology for respiratory failure with hypoxia.   2. Severe hypernatremia: Continue D5W and monitor sodium level.  3. 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 Patient was on hospice previously but declined their services most recently. Patient would most benefit from hospice home.  4. Acute renal failure: Due to dehydration and poor by mouth intake. Continue IV fluids and monitor creatinine.  5. Anemia of chronic disease: Transfuse hemoglobin less than 7 or oncology request.  6. Hypokalemia: Replete and recheck in a.m.  Patient is very poor overall prognosis. Patient benefit  from hospice. Oncology consult to discuss with patient's family about very poor prognosis..   CODE STATUS: DNR CRITICAL CARE TOTAL TIME TAKING CARE OF THIS PATIENT: 35 minutes.   DUE TO severe hypernatremia  POSSIBLE D/C ??, DEPENDING ON CLINICAL CONDITION.   Quaniya Damas M.D on 05/16/2015 at  9:12 AM  Between 7am to 6pm - Pager - [phone] After 6pm go to [email or web address] - password [email or web address] [facility]  [place] [facility]  Office  [phone]  CC: Primary care physician; No PCP Per Patient  Note: This dictation was prepared with Dragon dictation along with smaller phrase technology. Any transcriptional errors that result from this process are unintentional.

## 2015-05-16 NOTE — ED Provider Notes (Signed)
Time Seen: Approximately *1650  I have reviewed the triage notes  Chief Complaint: Weakness and Altered Mental Status   History of Present Illness: Brandy Odom is a 65 y.o. 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 Patient was on hospice care which was stopped and then the patient was scheduled for outpatient chemotherapy. The husband who is the primary and only historian at this time and takes care of his wife at home states that he has not been able to get her to eat or drink anything over the past week. He states that she has not had a fever but has had increased confusion at home. She has not had any persistent nausea or vomiting that he is aware of. Patient's awake alert and oriented 1 and able to answer some questions. The husband states he is not aware of any chest or abdominal pain. He states he has little outside help it at home. He states his wife is not been ambulatory over the last couple of weeks. He states he's been given her dialogue needed for pain. He denies giving access medication.   Past Medical History  Diagnosis Date  . Tobacco abuse   . Muscle spasm   . 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 Lung cancer, primary, with metastasis from lung to other site [facility])     Patient Active Problem List   Diagnosis Date Noted  . Hypernatremia 04/24/2015  . Neoplasm related pain 04/19/2015  . 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 Thrush 03/11/2015  . Adjustment disorder with mixed anxiety and depressed mood 02/26/2015  . Small cell lung cancer ([place]) 02/25/2015  . 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 Tumor associated pain 02/25/2015  . Dehydration 02/25/2015  . Intractable pain 02/25/2015  . 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 Abnormal radionuclide bone scan   . Adenopathy   . Abnormal CT of the abdomen   . Protein-calorie malnutrition,  severe 02/14/2015  . Bone lesion   . Lesion of lung   . Orbital lesion   . Dyspnea 02/12/2015  . Cough 02/12/2015  . Tobacco abuse 02/12/2015  . Metastasis ([place]) 02/12/2015  . Hypoxia 02/12/2015    Past Surgical History  Procedure Laterality Date  . Appendectomy    . Colonoscopy with propofol N/A 02/15/2015    Procedure: COLONOSCOPY WITH PROPOFOL;  Surgeon: Lucilla Lame, MD;  Location: [facility] ENDOSCOPY;  Service: Endoscopy;  Laterality: N/A;  . Portacath placement N/A 03/03/2015    Procedure: INSERTION PORT-A-CATH;  Surgeon: Florene Glen, MD;  Location: [facility] ORS;  Service: General;  Laterality: N/A;    Past Surgical History  Procedure Laterality Date  . Appendectomy    . Colonoscopy with propofol N/A 02/15/2015    Procedure: COLONOSCOPY WITH PROPOFOL;  Surgeon: Lucilla Lame, MD;  Location: [facility] ENDOSCOPY;  Service: Endoscopy;  Laterality: N/A;  . Portacath placement N/A 03/03/2015    Procedure: INSERTION PORT-A-CATH;  Surgeon: Florene Glen, MD;  Location: [facility] ORS;  Service: General;  Laterality: N/A;    No current outpatient prescriptions on file.  Allergies:  Bee venom; Naproxen; Sulfa antibiotics; and Antihistamines, chlorpheniramine-type  Family History: Family History  Problem Relation Age of Onset  . Colon cancer    . Healthy Mother   . Hypertension Father     Social History: Social History  Substance Use Topics  .  Smoking status: Current Every Day Smoker    Types: Cigarettes  . Smokeless tobacco: None  . Alcohol Use: No     Review of Systems:   10 point review of systems was performed and was otherwise negative:  Constitutional: No fever Eyes: No visual disturbances ENT: No sore throat, ear pain Cardiac: No chest pain Respiratory: Positive for shortness of breath without wheezing or stridor Abdomen: No abdominal pain, no vomiting, No diarrhea Endocrine: No weight loss, No night sweats Extremities: Chronic peripheral edema Skin: No rashes,  easy bruising Neurologic: No focal weakness, trouble with speech or swollowing Urologic: No dysuria, Hematuria, or urinary frequency   Physical Exam:  ED Triage Vitals  Enc Vitals Group     BP 05/05/2015 1620 93/69 mmHg     Pulse Rate 05/11/2015 1620 123     Resp 04/27/2015 1620 27     Temp 05/12/2015 1620 98.3 F (36.8 C)     Temp Source 05/17/2015 1620 Oral     SpO2 05/10/2015 1615 70 %     Weight 05/01/2015 1620 102 lb 8 oz (46.494 kg)     Height 05/02/2015 1620 '5\' 4"'$  (1.626 m)     Head Cir --      Peak Flow --      Pain Score 05/16/2015 2137 Asleep     Pain Loc --      Pain Edu? --      Excl. in [place]? --     General: Awake , Alert , and Oriented times 1. No signs of respiratory distress. She appears to be protecting her airway Head: Normal cephalic , atraumatic Eyes: Pupils equal , round, reactive to light Nose/Throat: No nasal drainage, patent upper airway without erythema or exudate.  Neck: Supple, Full range of motion, No anterior adenopathy or palpable thyroid masses Lungs: Diminished breath sounds bilaterally to bases without wheezes or rhonchi Heart: Tachycardia, regular rhythm without murmurs , gallops , or rubs Abdomen: Soft, non tender without rebound, guarding , or rigidity; bowel sounds positive and symmetric in all 4 quadrants. No organomegaly .        Extremities: 2 right lower extremity nonpitting edema Neurologic: Limited effort with neurologic testing which seems to be generalized weakness Skin: warm, dry, no rashes   Labs:   All laboratory work was reviewed including any pertinent negatives or positives listed below:  Labs Reviewed  COMPREHENSIVE METABOLIC PANEL - Abnormal; Notable for the following:    Sodium 158 (*)    Potassium 3.3 (*)    Chloride 115 (*)    Glucose, Bld 177 (*)    BUN 50 (*)    Creatinine, Ser 1.58 (*)    Calcium 10.8 (*)    Total Protein 6.3 (*)    Albumin 2.7 (*)    AST 56 (*)    Total Bilirubin 2.0 (*)    GFR calc non Af Amer 34 (*)     GFR calc Af Amer 39 (*)    All other components within normal limits  CBC WITH DIFFERENTIAL/PLATELET - Abnormal; Notable for the following:    RBC 3.21 (*)    Hemoglobin 9.9 (*)    HCT 30.3 (*)    RDW 20.5 (*)    All other components within normal limits  BRAIN NATRIURETIC PEPTIDE - Abnormal; Notable for the following:    B Natriuretic Peptide 570.0 (*)    All other components within normal limits  TROPONIN I - Abnormal; Notable for the following:  Troponin I 0.31 (*)    All other components within normal limits  AMMONIA - Abnormal; Notable for the following:    Ammonia 45 (*)    All other components within normal limits  FIBRIN DERIVATIVES D-DIMER ([facility] ONLY) - Abnormal; Notable for the following:    Fibrin derivatives D-dimer ([facility]) 988 (*)    All other components within normal limits  URINALYSIS COMPLETEWITH MICROSCOPIC ([facility] ONLY) - Abnormal; Notable for the following:    Color, Urine AMBER (*)    APPearance CLOUDY (*)    Hgb urine dipstick 1+ (*)    Leukocytes, UA TRACE (*)    Bacteria, UA MANY (*)    Squamous Epithelial / LPF 0-5 (*)    All other components within normal limits  LACTIC ACID, PLASMA  LACTIC ACID, PLASMA  BASIC METABOLIC PANEL  CBC   review laboratory work shows multiple abnormalities including what appears to be urinary tract infection and elevated D-dimer test and elevated ammonia, hypernatremia. Troponin is also elevated 0.31 Lactic acid level was negative EKG:   ED ECG REPORT I, Daymon Larsen, the attending physician, personally viewed and interpreted this ECG.  Date: 05/16/2015 EKG Time: 1617 Rate: 125 Rhythm: normal sinus rhythm QRS Axis: Left anterior fascicular block Intervals: normal ST/T Wave abnormalities: normal Conduction Disturbances: none Narrative Interpretation: unremarkable Poor R-wave progression with no acute ischemic changes   Radiology:   EXAM: CT ANGIOGRAPHY CHEST WITH CONTRAST  TECHNIQUE: Multidetector CT  imaging of the chest was performed using the standard protocol during bolus administration of intravenous contrast. Multiplanar CT image reconstructions and MIPs were obtained to evaluate the vascular anatomy.  CONTRAST: 22m OMNIPAQUE IOHEXOL 350 MG/ML SOLN  COMPARISON: 02/12/2015  FINDINGS: Lungs are well aerated bilaterally. Patchy nodular changes are noted in the left lower lobe with associated left lower lobe consolidation. These changes are new from the recent exam and likely represents some progression in the degree of metastatic disease. 10 mm nodule is noted in the right lower lobe medially new from the prior exam. This is best seen on image number 81 of series 6. Sulcal likely without represent some progressive metastatic disease. A subpleural nodule is noted on image number 26 and 27 of series 6 also likely representing metastatic disease.  Mediastinal lymphadenopathy is noted as well as some small supraclavicular lymph nodes. A right peritracheal lymph node is noted which now measures 17 mm in short axis. This is increased in size from the prior exam at which time it measured 12 mm. Right peritracheal adenopathy and hilar adenopathy is noted inferiorly which has increased somewhat in the interval from the prior exam. It encompasses the upper lobe pulmonary arterial branches although no occlusive changes are seen. Stable subcarinal adenopathy is noted. Multiple bilateral axillary lymph nodes are seen which are new from the prior exam. The largest of these lies on the left and measures 14 mm in short axis. This is best seen on image number 50 of series 5.  Retrocrural lymphadenopathy is noted at the level of the aortic hiatus. These measure 12 mm in greatest dimension and are significantly increased from 5 mm on the prior exam. This is also consistent with progressive metastatic disease. Aortic calcifications are noted without aneurysmal dilatation. No  pulmonary emboli are noted.  Bilateral adrenal enlargement is noted consistent with metastatic disease. The remainder the upper abdomen is within normal limits. Multiple mixed lytic and sclerotic lesions are noted throughout the spine as well as within the rib cage and  proximal left humerus consistent with metastatic disease.  Review of the MIP images confirms the above findings.  IMPRESSION: No evidence of pulmonary emboli.  Progression in the degree of both lung parenchymal nodularity as well as mediastinal adenopathy and axillary adenopathy consistent with metastatic disease  Diffuse bony metastatic disease. Stable metastatic disease to the adrenal glands bilaterally.   Electronically Signed By: Inez Catalina M.D. On: 05/03/2015 19:44 EXAM: CT HEAD WITHOUT CONTRAST  TECHNIQUE: Contiguous axial images were obtained from the base of the skull through the vertex without intravenous contrast.  COMPARISON: None.  FINDINGS: Bony calvarium demonstrates evidence of multiple lytic lesions consistent with metastatic disease. These are similar to that seen on the prior exam. Mild atrophic changes are noted. No findings to suggest acute hemorrhage, acute infarction or space-occupying mass lesion are noted.  IMPRESSION: Calvarial metastatic disease.  No acute intracranial abnormality is noted.   Electronically Signed By: Inez Catalina M.D. On: 04/29/2015 18:52          DG Chest Port 1 View (Final result) Result time: 05/12/2015 17:04:06   Final result by Rad Results In Interface (05/12/2015 17:04:06)   Narrative:   CLINICAL DATA: Shortness of Breath  EXAM: PORTABLE CHEST 1 VIEW  COMPARISON: 03/03/2015  FINDINGS: Left Port-A-Cath remains in place, unchanged. Heart is normal size. Linear scarring or atelectasis at the left base. Right lung is clear. No effusions or acute bony abnormality.  IMPRESSION: Left basilar scarring or atelectasis. No active  disease.       I personally reviewed the radiologic studies   Critical Care:  CRITICAL CARE Performed by: Daymon Larsen   Total critical care time: 43 minutes  Critical care time was exclusive of separately billable procedures and treating other patients.  Critical care was necessary to treat or prevent imminent or life-threatening deterioration.  Critical care was time spent personally by me on the following activities: development of treatment plan with patient and/or surrogate as well as nursing, discussions with consultants, evaluation of patient's response to treatment, examination of patient, obtaining history from patient or surrogate, ordering and performing treatments and interventions, ordering and review of laboratory studies, ordering and review of radiographic studies, pulse oximetry and re-evaluation of patient's condition. Evaluation for female with multiple severe medical problems with altered mental status  ED Course:  Patient's ED course showed some symptomatic improvement with IV fluid resuscitations. Patient arrives hypoxic with an altered mental status and hypotensive with an elevated heart rate. She does not prevent present with a fever. Patient has multiple laboratory work abnormalities which is likely secondary to her chronic illness. Patient appears to be significantly dehydrated and was initiated on IV fluid resuscitation. I felt was unlikely that the patient was septic at this time and most of her symptoms can be explained with dehydration. Patient does not have any obvious infectious source at this time.   Assessment:  Altered mental status 2. Dehydration Metastatic carcinoid syndrome  Final Clinical Impression:   Final diagnoses:  Hypoxia     Plan: * Inpatient management            Daymon Larsen, MD 05/16/15 [phone]

## 2015-05-16 NOTE — Plan of Care (Signed)
Problem: Bowel/Gastric: Goal: Will not experience complications related to bowel motility Outcome: Not Progressing Last BM unknown; patient's husband reports decreased PO intake, approx. 1 cup of broth over the past week per report from ED RN. Patient is unable to remember last BM at time of admission.

## 2015-05-17 ENCOUNTER — Ambulatory Visit: Payer: Self-pay

## 2015-05-17 ENCOUNTER — Ambulatory Visit
Admit: 2015-05-17 | Discharge: 2015-05-17 | Disposition: A | Discharge status: Home / Self Care | Payer: Self-pay | Attending: Radiation Oncology | Admitting: Radiation Oncology

## 2015-05-17 DIAGNOSIS — J9601 Acute respiratory failure with hypoxia: Principal | ICD-10-CM

## 2015-05-17 DIAGNOSIS — E870 Hyperosmolality and hypernatremia: Secondary | ICD-10-CM

## 2015-05-17 DIAGNOSIS — K5900 Constipation, unspecified: Secondary | ICD-10-CM

## 2015-05-17 DIAGNOSIS — C7A021 Malignant carcinoid tumor of the cecum: Secondary | ICD-10-CM

## 2015-05-17 DIAGNOSIS — C787 Secondary malignant neoplasm of liver and intrahepatic bile duct: Secondary | ICD-10-CM

## 2015-05-17 DIAGNOSIS — C7960 Secondary malignant neoplasm of unspecified ovary: Secondary | ICD-10-CM

## 2015-05-17 DIAGNOSIS — C7951 Secondary malignant neoplasm of bone: Secondary | ICD-10-CM

## 2015-05-17 DIAGNOSIS — D61818 Other pancytopenia: Secondary | ICD-10-CM

## 2015-05-17 DIAGNOSIS — N179 Acute kidney failure, unspecified: Secondary | ICD-10-CM

## 2015-05-17 DIAGNOSIS — C7800 Secondary malignant neoplasm of unspecified lung: Secondary | ICD-10-CM

## 2015-05-17 DIAGNOSIS — R634 Abnormal weight loss: Secondary | ICD-10-CM

## 2015-05-17 LAB — BASIC METABOLIC PANEL
Anion gap: 7 (ref 5–15)
BUN: 34 mg/dL — AB (ref 6–20)
CALCIUM: 10.1 mg/dL (ref 8.9–10.3)
CO2: 33 mmol/L — ABNORMAL HIGH (ref 22–32)
CREATININE: 1.13 mg/dL — AB (ref 0.44–1.00)
Chloride: 114 mmol/L — ABNORMAL HIGH (ref 101–111)
GFR calc Af Amer: 58 mL/min — ABNORMAL LOW (ref >60)
GFR, EST NON AFRICAN AMERICAN: 50 mL/min — AB (ref >60)
GLUCOSE: 144 mg/dL — AB (ref 65–99)
Potassium: 3.2 mmol/L — ABNORMAL LOW (ref 3.5–5.1)
Sodium: 154 mmol/L — ABNORMAL HIGH (ref 135–145)

## 2015-05-17 LAB — CBC
HEMATOCRIT: 22.3 % — AB (ref 35.0–47.0)
Hemoglobin: 7.2 g/dL — ABNORMAL LOW (ref 12.0–16.0)
MCH: 30 pg (ref 26.0–34.0)
MCHC: 32.5 g/dL (ref 32.0–36.0)
MCV: 92.1 fL (ref 80.0–100.0)
PLATELETS: 125 10*3/uL — AB (ref 150–440)
RBC: 2.42 MIL/uL — ABNORMAL LOW (ref 3.80–5.20)
RDW: 19.9 % — AB (ref 11.5–14.5)
WBC: 2.7 10*3/uL — ABNORMAL LOW (ref 3.6–11.0)

## 2015-05-17 LAB — MAGNESIUM: MAGNESIUM: 2.1 mg/dL (ref 1.7–2.4)

## 2015-05-17 LAB — MRSA PCR SCREENING: MRSA BY PCR: POSITIVE — AB

## 2015-05-17 MED ORDER — POTASSIUM CHLORIDE 10 MEQ/100ML IV SOLN
10.0000 meq | INTRAVENOUS | Status: AC
  Administered 2015-05-17 (×4): 10 meq via INTRAVENOUS
  Filled 2015-05-17 (×4): qty 100

## 2015-05-17 MED ORDER — CHLORHEXIDINE GLUCONATE CLOTH 2 % EX PADS
6.0000 | MEDICATED_PAD | Freq: Every day | CUTANEOUS | Status: DC
  Administered 2015-05-17: 06:00:00 6 via TOPICAL

## 2015-05-17 MED ORDER — ENSURE ENLIVE PO LIQD
237.0000 mL | Freq: Three times a day (TID) | ORAL | Status: DC
  Administered 2015-05-17 (×2): 237 mL via ORAL

## 2015-05-17 MED ORDER — MUPIROCIN 2 % EX OINT
1.0000 "application " | TOPICAL_OINTMENT | Freq: Two times a day (BID) | CUTANEOUS | Status: DC
  Administered 2015-05-17 – 2015-05-18 (×4): 1 via NASAL
  Filled 2015-05-17: qty 22

## 2015-05-17 MED ORDER — SODIUM CHLORIDE 0.45 % IV SOLN
Freq: Once | INTRAVENOUS | Status: AC
  Administered 2015-05-17: 14:00:00 via INTRAVENOUS

## 2015-05-17 MED ORDER — IPRATROPIUM-ALBUTEROL 0.5-2.5 (3) MG/3ML IN SOLN
3.0000 mL | Freq: Once | RESPIRATORY_TRACT | Status: AC
  Administered 2015-05-17: 3 mL via RESPIRATORY_TRACT
  Filled 2015-05-17: qty 3

## 2015-05-17 NOTE — Progress Notes (Signed)
Initial Nutrition Assessment  DOCUMENTATION CODES:   Severe malnutrition in context of chronic illness  INTERVENTION:   Meals and Snacks: Cater to patient preferences. Spoke with MD, Sudini regarding diet order as husband reports pt can only tolerate pureed/baby foods. MD to order dysphagia I diet order and SLP evaluation. Medical Food Supplement Therapy: will recommend Ensure Enlive po TID, each supplement provides 350 kcal and 20 grams of protein, and will modify s/p SLP evaluation as needed.    NUTRITION DIAGNOSIS:   Malnutrition related to chronic illness as evidenced by percent weight loss, energy intake < or equal to 75% for > or equal to 1 month, energy intake < or equal to 50% for > or equal to 5 days, severe depletion of body fat, severe depletion of muscle mass.  GOAL:   Patient will meet greater than or equal to 90% of their needs  MONITOR:    (Energy Intake, Electrolyte and renal Profile, Anthropometrics, Digestive system)  REASON FOR ASSESSMENT:   Malnutrition Screening Tool    ASSESSMENT:   Pt admitted with SOB, hypernatremia secondary to poor po intake with h/o metastatic small cell lung cancer with mets to bone, adrenal gland and brain.  Past Medical History  Diagnosis Date  . Tobacco abuse   . Muscle spasm   . 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 Lung cancer, primary, with metastasis from lung to other site [facility])      Diet Order:  DIET - DYS 1 Room service appropriate?: Yes; Fluid consistency:: Thin    Current Nutrition: Pt had not received breakfast this am on visit. CNA walked in with Regular diet order, tray to which pt's husband reports she is not capable of eating eggs, bacon, toast, cereal.    Food/Nutrition-Related History: Pt's husband reports pt has not eaten at all in 2 weeks. Pt's husband reports she normally tolerates baby food/pureeds. Pt po intake has been declining per report. RD asked pt if she liked Ensure, to which she  nodded and reports liking chocolate. RN Butch Penny also reported pt with poor dentition and from chemotherapy also has trouble with cold/hot foods.    Scheduled Medications:  . sodium chloride   Intravenous Once  . Chlorhexidine Gluconate Cloth  6 each Topical Q0600  . enoxaparin (LOVENOX) injection  30 mg Subcutaneous Q24H  . feeding supplement (ENSURE ENLIVE)  237 mL Oral TID WC  . mupirocin ointment  1 application Nasal BID  . nystatin  5 mL Oral QID  . QUEtiapine  25 mg Oral QHS    Continuous Medications:  . dextrose 5 % with KCl 20 mEq / L 20 mEq (05/17/15 0848)     Electrolyte/Renal Profile and Glucose Profile:   Recent Labs Lab 04/29/2015 1625 05/16/15 0445 05/16/15 1449 05/17/15 0433  NA 158* 158* 158* 154*  K 3.3* 3.2*  --  3.2*  CL 115* 119*  --  114*  CO2 32 32  --  33*  BUN 50* 46*  --  34*  CREATININE 1.58* 1.49*  --  1.13*  CALCIUM 10.8* 9.8  --  10.1  MG  --   --   --  2.1  GLUCOSE 177* 153*  --  144*   Protein Profile:  Recent Labs Lab 04/22/2015 1625  ALBUMIN 2.7*    Gastrointestinal Profile: Last BM: 05/17/2015   Nutrition-Focused Physical Exam Findings: Nutrition-Focused physical exam completed. Findings are mild-severe fat depletion, moderate-severe muscle depletion, and no edema.  Weight Change: Per [facility] weight loss of 26% in 2 months.   Height:   Ht Readings from Last 1 Encounters:  05/04/2015 '5\' 4"'$  (1.626 m)    Weight:   Wt Readings from Last 1 Encounters:  04/23/2015 102 lb 8 oz (46.494 kg)   Wt Readings from Last 10 Encounters:  05/14/2015 102 lb 8 oz (46.494 kg)  04/19/15 126 lb 8.7 oz (57.4 kg)  03/11/15 138 lb 14.2 oz (63 kg)  02/26/15 140 lb (63.504 kg)  02/15/15 139 lb (63.05 kg)  02/09/15 150 lb (68.04 kg)  02/04/15 145 lb (65.772 kg)    BMI:  Body mass index is 17.59 kg/(m^2).  Estimated Nutritional Needs:   Kcal:  BEE: 1005kcals, TEE: (IF 1.2-1.4)(AF 1.2) 1450-1689kcals  Protein:  52-62g protein (1.1-1.3g/kg)  Fluid:   1175-1439m of fluid (25-328mkg)  EDUCATION NEEDS:   No education needs identified at this time   HIHendleyRD, LDN Pager ([phone]/On-Call Pager ([phone]

## 2015-05-17 NOTE — Care Management (Signed)
Met with patient (husband was not at bedside) to discuss hospice agencies. Message left for husband to call this RNCM. Per Santiago Glad with [facility] they never got a chance to open patient for hospice. Patient states that she was followed by hospice and "the nurse was really rude". She declined going back with [facility] even if it was a [facility]. She has been provided a list of agencies that can provide HOME hospice care. She does not have a hospital bed or home O2 and Rx will be needed for [facility] to arrange with hospice agency she/husband selects. RNCM will continue to follow.

## 2015-05-17 NOTE — Progress Notes (Signed)
[facility] [place] NAME: Brandy Odom    MR#:  [id number]  DATE OF BIRTH:  10/14/1950  SUBJECTIVE:    Patient is confused Mumbling. Husband at bedside  REVIEW OF SYSTEMS:    Review of Systems  Unable to perform ROS  due to encephalopathy    DRUG ALLERGIES:   Allergies  Allergen Reactions  . Bee Venom Anaphylaxis  . Naproxen Nausea And Vomiting  . Sulfa Antibiotics Other (See Comments)    Reaction:  Unknown   . Antihistamines, Chlorpheniramine-Type Palpitations    VITALS:  Blood pressure 99/48, pulse 95, temperature 98.2 F (36.8 C), temperature source Oral, resp. rate 20, height '5\' 4"'$  (1.626 m), weight 46.494 kg (102 lb 8 oz), SpO2 99 %.  PHYSICAL EXAMINATION:   Physical Exam  Constitutional: No distress.  Frail appearing and lethargic  HENT:  Head: Normocephalic.  Eyes: No scleral icterus.  Neck: Neck supple. No JVD present. No tracheal deviation present. No thyromegaly present.  Cardiovascular: Regular rhythm and normal heart sounds.  Exam reveals no gallop and no friction rub.   No murmur heard. Tachycardic  Pulmonary/Chest: Effort normal. No respiratory distress. She has no wheezes. She has rales. She exhibits no tenderness.  Coarse breath sounds  Abdominal: Soft. Bowel sounds are normal. She exhibits no distension and no mass. There is no tenderness. There is no rebound and no guarding.  Musculoskeletal: Normal range of motion. She exhibits no edema.  Generalized weakness  Neurological: She is alert.  Drowzy  Skin: Skin is warm. No rash noted. No erythema.      LABORATORY PANEL:   CBC  Recent Labs Lab 05/17/15 0433  WBC 2.7*  HGB 7.2*  HCT 22.3*  PLT 125*   ------------------------------------------------------------------------------------------------------------------  Chemistries   Recent Labs Lab 04/23/2015 1625  05/17/15 0433  NA 158*  < > 154*  K 3.3*  < > 3.2*  CL 115*  < >  114*  CO2 32  < > 33*  GLUCOSE 177*  < > 144*  BUN 50*  < > 34*  CREATININE 1.58*  < > 1.13*  CALCIUM 10.8*  < > 10.1  AST 56*  --   --   ALT 16  --   --   ALKPHOS 126  --   --   BILITOT 2.0*  --   --   < > = values in this interval not displayed. ------------------------------------------------------------------------------------------------------------------  Cardiac Enzymes  Recent Labs Lab 05/01/2015 1625  TROPONINI 0.31*   ------------------------------------------------------------------------------------------------------------------  RADIOLOGY:  Ct Head Wo Contrast  04/28/2015  CLINICAL DATA:  Altered mental status, known colonic mass with metastatic disease EXAM: CT HEAD WITHOUT CONTRAST TECHNIQUE: Contiguous axial images were obtained from the base of the skull through the vertex without intravenous contrast. COMPARISON:  None. FINDINGS: Bony calvarium demonstrates evidence of multiple lytic lesions consistent with metastatic disease. These are similar to that seen on the prior exam. Mild atrophic changes are noted. No findings to suggest acute hemorrhage, acute infarction or space-occupying mass lesion are noted. IMPRESSION: Calvarial metastatic disease. No acute intracranial abnormality is noted. Electronically Signed   By: Inez Catalina M.D.   On: 05/08/2015 18:52   Ct Angio Chest Pe W/cm &/or Wo Cm  04/29/2015  CLINICAL DATA:  Hypoxia, history of lung carcinoma EXAM: CT ANGIOGRAPHY CHEST WITH CONTRAST TECHNIQUE: Multidetector CT imaging of the chest was performed using the standard protocol during bolus administration of intravenous contrast. Multiplanar CT image  reconstructions and MIPs were obtained to evaluate the vascular anatomy. CONTRAST:  38m OMNIPAQUE IOHEXOL 350 MG/ML SOLN COMPARISON:  02/12/2015 FINDINGS: Lungs are well aerated bilaterally. Patchy nodular changes are noted in the left lower lobe with associated left lower lobe consolidation. These changes are new from  the recent exam and likely represents some progression in the degree of metastatic disease. 10 mm nodule is noted in the right lower lobe medially new from the prior exam. This is best seen on image number 81 of series 6. Sulcal likely without represent some progressive metastatic disease. A subpleural nodule is noted on image number 26 and 27 of series 6 also likely representing metastatic disease. Mediastinal lymphadenopathy is noted as well as some small supraclavicular lymph nodes. A right peritracheal lymph node is noted which now measures 17 mm in short axis. This is increased in size from the prior exam at which time it measured 12 mm. Right peritracheal adenopathy and hilar adenopathy is noted inferiorly which has increased somewhat in the interval from the prior exam. It encompasses the upper lobe pulmonary arterial branches although no occlusive changes are seen. Stable subcarinal adenopathy is noted. Multiple bilateral axillary lymph nodes are seen which are new from the prior exam. The largest of these lies on the left and measures 14 mm in short axis. This is best seen on image number 50 of series 5. Retrocrural lymphadenopathy is noted at the level of the aortic hiatus. These measure 12 mm in greatest dimension and are significantly increased from 5 mm on the prior exam. This is also consistent with progressive metastatic disease. Aortic calcifications are noted without aneurysmal dilatation. No pulmonary emboli are noted. Bilateral adrenal enlargement is noted consistent with metastatic disease. The remainder the upper abdomen is within normal limits. Multiple mixed lytic and sclerotic lesions are noted throughout the spine as well as within the rib cage and proximal left humerus consistent with metastatic disease. Review of the MIP images confirms the above findings. IMPRESSION: No evidence of pulmonary emboli. Progression in the degree of both lung parenchymal nodularity as well as mediastinal  adenopathy and axillary adenopathy consistent with metastatic disease Diffuse bony metastatic disease. Stable metastatic disease to the adrenal glands bilaterally. Electronically Signed   By: MInez CatalinaM.D.   On: 05/13/2015 19:44   Dg Chest Port 1 View  05/02/2015  CLINICAL DATA:  Shortness of Breath EXAM: PORTABLE CHEST 1 VIEW COMPARISON:  03/03/2015 FINDINGS: Left Port-A-Cath remains in place, unchanged. Heart is normal size. Linear scarring or atelectasis at the left base. Right lung is clear. No effusions or acute bony abnormality. IMPRESSION: Left basilar scarring or atelectasis.  No active disease. Electronically Signed   By: KRolm BaptiseM.D.   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  * Severe hypernatremia due to dehydration and being on Lasix with poor oral intake on D5 at 100 ML per hour. Very slow improvement. Patient is still confused. Unstable.  * Acute encephalopathy - no improvement Likely due to hypernatremia and worsening brain metastasis.  * Acute respiratory failure with worseninghypoxia CT chest negative for PE but does show worsening metastatic disease which is likely etiology for respiratory failure with hypoxia.  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 also consult palliative care  Patient was on hospice previously but declined their services most recently. Patient would likely benefit from hospice home.  * Acute renal failure: Due to dehydration and poor by mouth intake  improving  * Anemia of chronic disease: Transfuse hemoglobin if less than discussed with husband at bedside regarding poor prognosis. He would like to discuss with oncology further.  CODE  STATUS: DNR TOTAL TIME TAKING CARE OF THIS PATIENT: 35 minutes.    Hillary Bow R M.D on 05/17/2015 at 10:12 AM  Between 7am to 6pm - Pager - [phone] After 6pm go to [email or web address] - password [email or web address] [facility]  [facility]  Office  [phone]  CC: Primary care physician; No PCP Per Patient  Note: This dictation was prepared with Dragon dictation along with smaller phrase technology. Any transcriptional errors that result from this process are unintentional.

## 2015-05-17 NOTE — Progress Notes (Signed)
Notified by lab that PCR is positive for MRSA. 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 Standing orders entered for positive PCR result per protocol.

## 2015-05-18 ENCOUNTER — Ambulatory Visit: Payer: Self-pay | Admitting: Hematology and Oncology

## 2015-05-18 ENCOUNTER — Ambulatory Visit: Payer: Self-pay

## 2015-05-18 DIAGNOSIS — E860 Dehydration: Secondary | ICD-10-CM

## 2015-05-18 DIAGNOSIS — C7931 Secondary malignant neoplasm of brain: Secondary | ICD-10-CM

## 2015-05-18 LAB — BASIC METABOLIC PANEL
Anion gap: 7 (ref 5–15)
BUN: 23 mg/dL — AB (ref 6–20)
CO2: 28 mmol/L (ref 22–32)
CREATININE: 0.96 mg/dL (ref 0.44–1.00)
Calcium: 10.4 mg/dL — ABNORMAL HIGH (ref 8.9–10.3)
Chloride: 116 mmol/L — ABNORMAL HIGH (ref 101–111)
GFR calc Af Amer: >60 mL/min (ref >60)
GLUCOSE: 122 mg/dL — AB (ref 65–99)
POTASSIUM: 4.5 mmol/L (ref 3.5–5.1)
Sodium: 151 mmol/L — ABNORMAL HIGH (ref 135–145)

## 2015-05-18 MED ORDER — LORAZEPAM 2 MG/ML IJ SOLN
1.0000 mg | INTRAMUSCULAR | Status: DC | PRN
  Filled 2015-05-18: qty 1

## 2015-05-18 MED ORDER — LORAZEPAM 2 MG/ML IJ SOLN: 0.5000 mg | INTRAMUSCULAR | Status: DC | PRN

## 2015-05-18 MED ORDER — BISACODYL 10 MG RE SUPP: 10.0000 mg | Freq: Every day | RECTAL | Status: DC | PRN

## 2015-05-18 MED ORDER — ONDANSETRON HCL 4 MG/2ML IJ SOLN: 4.0000 mg | Freq: Four times a day (QID) | INTRAMUSCULAR | Status: DC | PRN

## 2015-05-18 MED ORDER — GLYCOPYRROLATE 0.2 MG/ML IJ SOLN
0.1000 mg | Freq: Three times a day (TID) | INTRAMUSCULAR | Status: DC | PRN
  Administered 2015-05-18: 16:00:00 0.1 mg via INTRAVENOUS
  Filled 2015-05-18 (×2): qty 1

## 2015-05-18 MED ORDER — MORPHINE SULFATE (CONCENTRATE) 10 MG/0.5ML PO SOLN
5.0000 mg | ORAL | Status: DC | PRN
  Administered 2015-05-18: 16:00:00 5 mg via ORAL
  Filled 2015-05-18: qty 1

## 2015-05-18 MED ORDER — MORPHINE SULFATE 25 MG/ML IV SOLN
5.0000 mg/h | INTRAVENOUS | Status: DC
  Administered 2015-05-18: 18:00:00 5 mg/h via INTRAVENOUS
  Filled 2015-05-18: qty 10

## 2015-05-18 MED ORDER — HYDROMORPHONE HCL 1 MG/ML IJ SOLN: 0.5000 mg | INTRAMUSCULAR | Status: DC | PRN

## 2015-05-18 MED ORDER — PROCHLORPERAZINE 25 MG RE SUPP
25.0000 mg | Freq: Three times a day (TID) | RECTAL | Status: DC | PRN
  Filled 2015-05-18: qty 1

## 2015-05-18 NOTE — Progress Notes (Signed)
Palliative Care Update  I met with Mr Brandy Odom and went over pts condition.  He can see how she is himself.  Her blood pressures are running low (78/56).  She can't eat (wasn't eating for 2 weeks before coming in --he says he only got one can of beef broth down her in two weeks time).  She is not waking up.  She has progressive disease with enlarging mets on each of the recent imaging studies (I reviewed these with pts husband).  He asked me to 'please keep her out of pain'. He does not want her to have to wake up because he knows she is suffering.  He does not want her to go to [facility] (as they both have an opinion that they don't like Hospice organizations --and this is not going to be something that changes).  He says she cannot go home because he can't take care of her like this. She won't live long --and is expected to pass away here within days.    I have spoken with Mr Pies about starting a morphine drip and he agrees with this.    Will update Dr. Merceda Elks and follow pts response to our comfort terminal care orders.    Colleen Can, MD

## 2015-05-18 NOTE — Progress Notes (Signed)
Palliative Medicine Inpatient Consult Note   Name: Brandy Odom Date: 05/18/2015 MRN: [id number]  DOB: 1950/09/24  Referring Physician: Hillary Bow, MD  Palliative Care consult requested for this 65 y.o. 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    Brief History: Pt is known to me from a December admission when she was diagnosed with widely metastatic lung cancer.  She stayed in the hospital for a protracted period of time partly due to indecision about whether she wanted palliative treatment or hospice care in the home.  Her husband was similarly conflicted about the options. At the time, they both ultimately settled on having pt get Hospice services in the home (though husband was concerned about any services --even free services --since pt has no health insurance).  When the home hospice nurse went out to the family home after pt was discharged from the hospital, the pt and husband refused Hospice services.  It seems that they then thought that there could be some chance of cure with radiation and/or chemo, though it had been clearly presented to them both as only palliative measures.  The pt ended up not have Hospice or [facility] or home health of any kind and the equipment that had been sent to the home was sent back.    She then did end up getting the cranial radiation that had been recommended to her (she had been somewhat indecisive on this until that time).  Most recently, she has been at home in the bed and unable to eat. She came in with severe hypernatremia (sodium 158) as well as acute respiratory failure with O2 sats 70% on room air.  She responded to oxygen at 5 LPM via nasal cannula and currently has good sats on 3 LPM oxygen.  But she is worsening overall instead of stabilizing or improving. Today, she has very loud bronchial rattles and her blood pressure is now down  to 78/56.    She had been getting her meds orally, but she is not alert enough now for oral intake or oral meds. Her sodium improved to 151 and other labs are not worrisome though she is quite anemic with a Hgb of 7.2 (she has a chronic anemia --of chronic disease most likely).    She is noted to have very severe malnutrition with very poor oral intake and now zero intake as she is NPO.  Pt had an SLP evaluation and was noted to be at profound risk for aspiration with a weak cough and poor protection of upper airway.   She was made NPO status by SLP in the late morning today.    TODAY'S DISCUSSIONS AND PLANS: Pt is deteriorating rapidly.  She is well known to me and I have talked with her husband before during the previous admission.  He was at that time in some degree of denial. There were also some other issues relating to his business that may have affected some of the decision making then about not going with Hospice (pt is uninsured and they are very private about income etc).  Pt's husband will be here soon as I told him she is not doing well at all and may pass away here in the hospital.  I have talked with Dr. Mike Gip, Dr Darvin Neighbours, nursing, and social worker.   I have changed her over to all IV meds and yet, I sense  she might do better with a morphine drip. She had been changed from morphine to dilauded in December simply b/c the oral morphine she got then was not strong enough --but she might do well on morphine now.  Dr Mike Gip had hoped we could have a conference set up, but perhaps things are progressing so fast that this might not be needed.    I had tried to call her husband on his one and only cell contact number, but after numerous attempts was about to give up when I finally reached him.  He is coming here now and I will talk to him about terminal comfort care here vs Hospice Home vs in the home--there are notes about them not liking Hospice but I can only explore things now with the  husband, as pt is not able to communicate (and was probably confused since she never actually had hospice or [facility] at all).    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) - and diffuse bone mets and mediastinal and pulmonary mets Dehydration from poor oral intake Carcinoid Hypernatremia Anemia of chronic disease Pancytopenia Anxiety Depression Bone pain and other pain associated with cancer lesions and bedbound status Loss of appetite Severe Malnutrition in the context of terminal cancer Hypotension --multifactorial Poor secretion management Confusion and altered level of awareness --multifactorial Limited mobility Dysphagia (she had a mild dysphagia in December --now it is severe) Tobacco smoker   REVIEW OF SYSTEMS:  Patient is not able to provide ROS due to being obtunded  SPIRITUAL SUPPORT SYSTEM: Husband is main support.   SOCIAL HISTORY:  reports that she has been smoking Cigarettes.  She has been smoking about 0.50 packs per day. She does not have any smokeless tobacco history on file. She reports that she does not drink alcohol or use illicit drugs.  LEGAL DOCUMENTS:  I have placed a portable DNR form in the paper chart as she did not arrive with one though she is DNR status.   CODE STATUS: DNR  PAST MEDICAL HISTORY: Past Medical History  Diagnosis Date  . Tobacco abuse   . Muscle spasm   . Small cell lung cancer ([place])     Metastatic  . Lung cancer, primary, with metastasis from lung to other site [facility])     PAST SURGICAL HISTORY:  Past Surgical History  Procedure Laterality Date  . Appendectomy    . Colonoscopy with propofol N/A 02/15/2015    Procedure: COLONOSCOPY WITH PROPOFOL;  Surgeon: Lucilla Lame, MD;  Location: [facility] ENDOSCOPY;  Service: Endoscopy;  Laterality: N/A;  . Portacath placement N/A 03/03/2015    Procedure: INSERTION PORT-A-CATH;  Surgeon: Florene Glen, MD;  Location: [facility] ORS;   Service: General;  Laterality: N/A;    ALLERGIES:  is allergic to bee venom; naproxen; sulfa antibiotics; and antihistamines, chlorpheniramine-type.  MEDICATIONS:  Current Facility-Administered Medications  Medication Dose Route Frequency Provider Last Rate Last Dose  . acetaminophen (TYLENOL) tablet 650 mg  650 mg Oral Q6H PRN Loletha Grayer, MD       Or  . acetaminophen (TYLENOL) suppository 650 mg  650 mg Rectal Q6H PRN Loletha Grayer, MD      . Chlorhexidine Gluconate Cloth 2 % PADS 6 each  6 each Topical Q0600 Hillary Bow, MD   6 each at 05/17/15 0600  . dextrose 5 % with KCl 20 mEq / L  infusion  20 mEq Intravenous Continuous Bettey Costa, MD 100 mL/hr at 05/18/15 1300  20 mEq at 05/18/15 1300  . diazepam (VALIUM) tablet 2 mg  2 mg Oral Q12H PRN Loletha Grayer, MD   2 mg at 05/17/15 0230  . enoxaparin (LOVENOX) injection 30 mg  30 mg Subcutaneous Q24H Loletha Grayer, MD   30 mg at 05/17/15 2101  . HYDROmorphone (DILAUDID) tablet 1 mg  1 mg Oral Q3H PRN Loletha Grayer, MD      . mupirocin ointment (BACTROBAN) 2 % 1 application  1 application Nasal BID Hillary Bow, MD   1 application at 64/40/34 0941  . nystatin (MYCOSTATIN) 100000 UNIT/ML suspension 500,000 Units  5 mL Oral QID Loletha Grayer, MD   500,000 Units at 05/18/15 [id number]  . QUEtiapine (SEROQUEL) tablet 25 mg  25 mg Oral QHS Loletha Grayer, MD   25 mg at 05/17/15 2101    Vital Signs: BP 90/56 mmHg  Pulse 100  Temp(Src) 99.2 F (37.3 C) (Oral)  Resp 22  Ht '5\' 4"'$  (1.626 m)  Wt 46.494 kg (102 lb 8 oz)  BMI 17.59 kg/m2  SpO2 98% Filed Weights   05/13/2015 1620  Weight: 46.494 kg (102 lb 8 oz)    Estimated body mass index is 17.59 kg/(m^2) as calculated from the following:   Height as of this encounter: '5\' 4"'$  (1.626 m).   Weight as of this encounter: 46.494 kg (102 lb 8 oz).  PERFORMANCE STATUS (ECOG) : 4 - Bedbound  PHYSICAL EXAM: Seen on two occasions today: During first visit in the am, she was heard to have  gurgling upper airway sounds and she was difficult to arouse --speaking unintelligibly. During my second visit at nearly 3 pm, pt is noted to have much louder wet gurgling upper airway rattles, and she does not wake up for me except to open eyes.  No JVD can be seen Hrt rrr but tachy at 105 on my second exam Lungs with very loud rattling upper airway rattles Abd soft and NT Ext no mottling or cyanosis Skin is warm and dry   I asked for a set of vitals at 3:30 pm and they are: sats are 98 % (on 3 LPM) Resp are about 20 BP is 76/58 Pulse is 96  LABS: CBC:    Component Value Date/Time   WBC 2.7* 05/17/2015 0433   WBC 9.0 04/10/2012 1601   HGB 7.2* 05/17/2015 0433   HGB 16.0 04/10/2012 1601   HCT 22.3* 05/17/2015 0433   HCT 46.4 04/10/2012 1601   PLT 125* 05/17/2015 0433   PLT 222 04/10/2012 1601   MCV 92.1 05/17/2015 0433   MCV 85 04/10/2012 1601   NEUTROABS 3.8 05/09/2015 1625   LYMPHSABS 1.0 05/02/2015 1625   MONOABS 0.5 05/08/2015 1625   EOSABS 0.0 05/14/2015 1625   BASOSABS 0.0 05/08/2015 1625   Comprehensive Metabolic Panel:    Component Value Date/Time   NA 151* 05/18/2015 0534   NA 138 04/10/2012 1601   K 4.5 05/18/2015 0534   K 3.9 04/10/2012 1601   CL 116* 05/18/2015 0534   CL 106 04/10/2012 1601   CO2 28 05/18/2015 0534   CO2 27 04/10/2012 1601   BUN 23* 05/18/2015 0534   BUN 12 04/10/2012 1601   CREATININE 0.96 05/18/2015 0534   CREATININE 0.89 04/10/2012 1601   GLUCOSE 122* 05/18/2015 0534   GLUCOSE 97 04/10/2012 1601   CALCIUM 10.4* 05/18/2015 0534   CALCIUM 9.7 04/10/2012 1601   AST 56* 05/05/2015 1625   ALT 16 05/18/2015 1625   ALKPHOS 126 04/22/2015  1625   BILITOT 2.0* 04/23/2015 1625   PROT 6.3* 05/12/2015 1625   ALBUMIN 2.7* 05/14/2015 1625   IMAGING DATA SUMMARY: Chest CT on 02/12/2015 revealed mixed lucent and sclerotic and expansile lesions in the spine, ribs, and sternum. There was mediastinal lymphadenopathy with bilateral pulmonary  nodules (some solid nodules and some patchy ground-glass nodules). There was focal spiculation in the right middle lung. There was bilateral adrenal gland nodules and skin nodules.I  Abdominal/pelvic CT scan on 11/27/2016revealed a 3.7 x 3.2 x 3.7 cm mass in the cecum with associated ileocolic lymphadenopathy (largest 3.8 cm), metastatic disease to adrenals bilaterally, and a suspicious lesion in segment 5 of the liver. There was widespread osseous metastasis. There were multiple low to intermediate attenuation cystic appearing lesions in the ovaries. 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 CA125 was 38.3 (0-38.1) on 02/15/2015.  Head CT revealed a small ovoid soft tissue lesion within the medial aspect of the left orbit just superior to the medial rectus muscle. Orbital MRI on 02/15/2015 revealed a 7 x10 x 9 mm lesion on the left superior oblique muscle and multiple focal areas of enhancement (cerebellum, left basal ganglia, thalamus, and anteromedial right frontal lobe) concerning for metastatic disease.  Bone scan on 02/15/2015 revealed abnormal uptake throughout the calvarium, ribs, vertebral bodies, pelvis, bilateral femurs and shoulders consistent with metastatic disease. Plain films reveal no evidence of impending fracture.  Colonoscopy on 02/15/2015 revealed a frond-like/villous partially obstructing non-circumferential mass in the cecum. 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 CEA was 333.8 on 02/14/2015.  CT guided right iliac crest biopsy on 02/19/2015 confirmed metastatic small cell carcinoma of the lung. CK7, TTF1, CD56 were positive. CK20 and CDX-2 were negative.  Chest CT angiogram on 04/26/2015 revealed no evidence of pulmonary embolism, but progressive pulmonary nodularity and mediastinal adenopathy. There was diffuse bony metastatic disease.    More than 50% of the visit  was spent in counseling/coordination of care: Yes  Time Spent: 120 minutes

## 2015-05-18 NOTE — Plan of Care (Addendum)
Problem: Pain Managment: Goal: General experience of comfort will improve Outcome: Progressing Restless at intevals.  Rhonchi crackles and rales/  Robinul and  roxanol  Given this pm.

## 2015-05-18 NOTE — Progress Notes (Signed)
[facility] [place] NAME: Brandy Odom    MR#:  [id number]  DATE OF BIRTH:  [birth year]  SUBJECTIVE:    Patient is confused Mumbling a few words. Has gurgling sounds.  REVIEW OF SYSTEMS:    Review of Systems  Unable to perform ROS  due to encephalopathy  DRUG ALLERGIES:   Allergies  Allergen Reactions  . Bee Venom Anaphylaxis  . Naproxen Nausea And Vomiting  . Sulfa Antibiotics Other (See Comments)    Reaction:  Unknown   . Antihistamines, Chlorpheniramine-Type Palpitations    VITALS:  Blood pressure 103/61, pulse 90, temperature 98.8 F (37.1 C), temperature source Oral, resp. rate 22, height '5\' 4"'$  (1.626 m), weight 46.494 kg (102 lb 8 oz), SpO2 99 %.  PHYSICAL EXAMINATION:   Physical Exam  Constitutional: No distress.  Frail appearing and lethargic  HENT:  Head: Normocephalic.  Eyes: No scleral icterus.  Neck: Neck supple. No JVD present. No tracheal deviation present. No thyromegaly present.  Cardiovascular: Regular rhythm and normal heart sounds.  Exam reveals no gallop and no friction rub.   No murmur heard. Tachycardic  Pulmonary/Chest: Effort normal. No respiratory distress. She has no wheezes. She has rales. She exhibits no tenderness.  Coarse breath sounds  Abdominal: Soft. Bowel sounds are normal. She exhibits no distension and no mass. There is no tenderness. There is no rebound and no guarding.  Musculoskeletal: Normal range of motion. She exhibits no edema.  Generalized weakness  Neurological: She is alert.  Drowzy  Skin: Skin is warm. No rash noted. No erythema.      LABORATORY PANEL:   CBC  Recent Labs Lab 05/17/15 0433  WBC 2.7*  HGB 7.2*  HCT 22.3*  PLT 125*   ------------------------------------------------------------------------------------------------------------------  Chemistries   Recent Labs Lab 04/23/2015 1625  05/17/15 0433 05/18/15 0534  NA 158*  < > 154* 151*  K  3.3*  < > 3.2* 4.5  CL 115*  < > 114* 116*  CO2 32  < > 33* 28  GLUCOSE 177*  < > 144* 122*  BUN 50*  < > 34* 23*  CREATININE 1.58*  < > 1.13* 0.96  CALCIUM 10.8*  < > 10.1 10.4*  MG  --   --  2.1  --   AST 56*  --   --   --   ALT 16  --   --   --   ALKPHOS 126  --   --   --   BILITOT 2.0*  --   --   --   < > = values in this interval not displayed. ------------------------------------------------------------------------------------------------------------------  Cardiac Enzymes  Recent Labs Lab 05/10/2015 1625  TROPONINI 0.31*   ------------------------------------------------------------------------------------------------------------------  RADIOLOGY:  No results found.   ASSESSMENT AND PLAN:   65 year old female with a history of small cell lung cancer with metastatic disease who presents with encephalopathy And generalized weakness.  * Acute respiratory failure with worsening hypoxia CT chest negative for PE but does show worsening metastatic disease which is likely etiology for respiratory failure with hypoxia. Patient has difficult time clearing her secretions which is contributing to this significantly.  * Severe hypernatremia due to dehydration and being on Lasix with poor oral intake on D5 at 100 ML per hour. Very slow improvement. Patient is still confused. Unstable.  * Acute encephalopathy - no improvement Likely due to hypernatremia and worsening brain metastasis.  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  * Acute renal failure: Due to dehydration and poor by mouth intake  improving  * Anemia of chronic disease: Transfuse hemoglobin if less than 7.  Conference with husband including oncology and palliative care later today.  Poor prognosis. Patient seems to have declined significantly in the last 1 month as per  oncology note. Presentlyshe seems hospice home appropriate. Discussed with Dr. Megan Salon of palliative care.  CODE STATUS: DNR  TOTAL TIME TAKING CARE OF THIS PATIENT: 35 minutes.    Hillary Bow R M.D on 05/18/2015 at 1:20 PM  Between 7am to 6pm - Pager - [phone] After 6pm go to [email or web address] - password [email or web address] [facility]  [facility]  Office  [phone]  CC: Primary care physician; No PCP Per Patient  Note: This dictation was prepared with Dragon dictation along with smaller phrase technology. Any transcriptional errors that result from this process are unintentional.

## 2015-05-18 NOTE — Evaluation (Signed)
Clinical/Bedside Swallow Evaluation Patient Details  Name: Brandy Odom MRN: [id number] Date of Birth: 04-04-1950  Today's Date: 05/18/2015 Time: SLP Start Time (ACUTE ONLY): 0830 SLP Stop Time (ACUTE ONLY): 0930 SLP Time Calculation (min) (ACUTE ONLY): 60 min  Past Medical History:  Past Medical History  Diagnosis Date  . Tobacco abuse   . Muscle spasm   . Small cell lung cancer ([place])     Metastatic  . Lung cancer, primary, with metastasis from lung to other site [facility])    Past Surgical History:  Past Surgical History  Procedure Laterality Date  . Appendectomy    . Colonoscopy with propofol N/A 02/15/2015    Procedure: COLONOSCOPY WITH PROPOFOL;  Surgeon: Lucilla Lame, MD;  Location: [facility] ENDOSCOPY;  Service: Endoscopy;  Laterality: N/A;  . Portacath placement N/A 03/03/2015    Procedure: INSERTION PORT-A-CATH;  Surgeon: Florene Glen, MD;  Location: [facility] ORS;  Service: General;  Laterality: N/A;   HPI:  Pt is a 65 y.o. female w/ h/o lung Ca(metastatic) brought in with not eating for the past 2 weeks and incoherent. The husband states that he's been giving the dye loaded one pill every 6 hours for 2 mg pill because he is having trouble splitting it to a half a pill every 3 hours. The patient is too weak to get out of bed. The patient is able to answer questions. Most the history obtained from husband at the bedside. The patient does have some shortness of breath and cough and abdominal pain and chest pain. Pt has been eating very little; "baby food" per what husband told the Dietician. Pt presents w/ severe weakness and fatigue and only gives a few mumbled responses w/ low volume of speech. Pt is on a puree diet from admission per MD. Pt has congested, upper airway respirations at rest prior to po's.    Assessment / Plan / Recommendation Clinical Impression  Pt appears at increased risk for aspiration d/t moderate oropharyngeal phase dysphagia. Pt's oral phase is c/b reduced  strength and prolonged A-P transfer time; adequate oral clearing post swallowing given time. Suspect delayed pharyngeal swallow initiation w/ all trials; overt coughing was noted w/ trials of thin liquids - pt's cough was weak and suspect poorly protective of her airway. Pt appeared to tolerate a few trials each of nectar consistency liquids via tsp and pinched straw; purees(1/2 tsp amounts) w/ no immediate s/s of aspiration or decline in status overall. Pt's congested, upper airway respirations continued during session. Pt declined further po's after only a few of each consistency stating she "needed to rest". Again, suspect pt is at increased risk for aspiration w/ any po intake d/t declined medical and respiratory status'. MD consulted and agreed w/ holding oral diet at this time d/t the high risk for aspiration. ST will f/u tomorrow for ongoing assessment of pt's status. NSG updated.     Aspiration Risk  Severe aspiration risk    Diet Recommendation  NPO w/ oral care frequently during day for hygiene and oral stim; aspiration precautions  Medication Administration: Via alternative means    Other  Recommendations Recommended Consults:  (dietician following) Oral Care Recommendations: Oral care QID;Staff/trained caregiver to provide oral care   Follow up Recommendations   (TBD)    Frequency and Duration min 3x week  2 weeks       Prognosis Prognosis for Safe Diet Advancement: Guarded Barriers to Reach Goals: Severity of deficits      Swallow Study  General Date of Onset: 05/06/2015 HPI: Pt is a 65 y.o. female w/ h/o lung Ca(metastatic) brought in with not eating for the past 2 weeks and incoherent. The husband states that he's been giving the dye loaded one pill every 6 hours for 2 mg pill because he is having trouble splitting it to a half a pill every 3 hours. The patient is too weak to get out of bed. The patient is able to answer questions. Most the history obtained from husband at the  bedside. The patient does have some shortness of breath and cough and abdominal pain and chest pain. Pt has been eating very little; "baby food" per what husband told the Dietician. Pt presents w/ severe weakness and fatigue and only gives a few mumbled responses w/ low volume of speech. Pt is on a puree diet from admission per MD. Pt has congested, upper airway respirations at rest prior to po's.  Type of Study: Bedside Swallow Evaluation Previous Swallow Assessment: unknown Diet Prior to this Study:  ("baby food" per husband; thin liquids) Temperature Spikes Noted: No (wbc 2.7) Respiratory Status: Nasal cannula (5 liters) History of Recent Intubation: No Behavior/Cognition: Cooperative;Lethargic/Drowsy;Requires cueing (fatigued and weak; awakened more fully w/ verbal cues) Oral Cavity Assessment: Dry Oral Care Completed by SLP: Yes (attempted) Oral Cavity - Dentition: Missing dentition Vision:  (n/a) Self-Feeding Abilities: Total assist Patient Positioning: Upright in bed Baseline Vocal Quality: Low vocal intensity (mumbled) Volitional Cough: Weak Volitional Swallow: Able to elicit    Oral/Motor/Sensory Function Overall Oral Motor/Sensory Function: Moderate impairment (overall weakness w/ OM movements) Lingual Symmetry: Within Functional Limits Lingual Strength: Reduced Velum:  (CNT) Mandible:  (CNT)   Ice Chips Ice chips: Within functional limits Presentation: Spoon (fed; 3 trials(single chips)) Other Comments: reduced oral manipulation of the boluses however   Thin Liquid Thin Liquid: Impaired Presentation: Cup;Spoon (x2 trials ) Oral Phase Impairments: Reduced labial seal (on cup) Oral Phase Functional Implications: Prolonged oral transit (anterior spillage) Pharyngeal  Phase Impairments: Cough - Delayed;Suspected delayed Swallow (x2) Other Comments: pt does not appear safe w/ thin liquids    Nectar Thick Nectar Thick Liquid: Impaired Presentation: Cup;Spoon;Straw (pinched  straw(3); 6 trials total) Oral Phase Impairments: Reduced labial seal Oral phase functional implications: Prolonged oral transit (anterior spillage) Pharyngeal Phase Impairments: Suspected delayed Swallow (none noted)   Honey Thick Honey Thick Liquid: Not tested   Puree Puree: Impaired Presentation: Spoon (fed; 4 trials) Oral Phase Impairments: Reduced labial seal Oral Phase Functional Implications: Prolonged oral transit Pharyngeal Phase Impairments:  (none noted)   Solid   GO   Solid: Not tested       Orinda Kenner, MS, CCC-SLP  Carmeline Kowal 05/18/2015,11:23 AM

## 2015-05-18 NOTE — Progress Notes (Signed)
[facility]  Date of admission:  04/21/2015  Inpatient day:  05/17/2015  Consulting physician:  Dr. Hillary Bow  Chief Complaint: Brandy Odom is a 65 y.o. female with extensive stage small cell lung cancer who was admitted with acute respirator failure and hypoxia on room air, severe hypernatremia, hypercalcemia, and acute renal failure.  HPI:  Patient last seen in the medical oncology clinic on 04/19/2015.  At that time, she was seen for reassessment after completion of cranial radiation.  She was alone in clinic.  She stated that she was able to perform some of her activities of daily living.  She was able to help dress herself as well as help take a shower.  Her vision was better.  She denied any respiratory symptoms.  Pain was controlled on Dilaudid prn.  She was considering systemic therapy.  I discussed side effects associated with chemotherapy.  I discussed new baseline imaging as it had been some time since assessment.  I suggested reconvening in 1 week with her husband to finalize a plan given her changing thoughts about treatment in the past (hospice then no treatment).  She was brought into the emergency room on 05/12/2015 incoherent and having not eaten for 2 weeks.  The patient states that recently she has been in bed.  She is unable to perform her activities of daily living.    Evaluation revealed room air saturations of 70%.  Chest CT angiogram revealed no evidence of pulmonary embolism, but progressive pulmonary nodularity and mediastinal adenopathy.  There was diffuse bony metastatic disease.  Labs revealed hypernatremia (sodium 158), calcium 10.8, BUN 50, creatinine 1.58, albumen 2.7, and bilirubin 2.0.  With hydration, sodium is 154, BUN 34, creatinine 1.13.  She has pancytopenia with a hematocrit of 22.3, hemoglobin 7.2, platelets 125,000, and WBC 2700 consistent with marrow replacement.  Subjective:  Patient notes confusion about recent events.    Past  Medical History  Diagnosis Date  . Tobacco abuse   . Muscle spasm   . Small cell lung cancer ([place])     Metastatic  . Lung cancer, primary, with metastasis from lung to other site [facility])     Past Surgical History  Procedure Laterality Date  . Appendectomy    . Colonoscopy with propofol N/A 02/15/2015    Procedure: COLONOSCOPY WITH PROPOFOL;  Surgeon: Lucilla Lame, MD;  Location: [facility] ENDOSCOPY;  Service: Endoscopy;  Laterality: N/A;  . Portacath placement N/A 03/03/2015    Procedure: INSERTION PORT-A-CATH;  Surgeon: Florene Glen, MD;  Location: [facility] ORS;  Service: General;  Laterality: N/A;    Family History  Problem Relation Age of Onset  . Colon cancer    . Healthy Mother   . Hypertension Father     Social History:  reports that she has been smoking Cigarettes.  She has been smoking about 0.50 packs per day. She does not have any smokeless tobacco history on file. She reports that she does not drink alcohol or use illicit drugs.  She lives in [place] with her husband, [name].  She is alone today.  Allergies:  Allergies  Allergen Reactions  . Bee Venom Anaphylaxis  . Naproxen Nausea And Vomiting  . Sulfa Antibiotics Other (See Comments)    Reaction:  Unknown   . Antihistamines, Chlorpheniramine-Type Palpitations    Medications Prior to Admission  Medication Sig Dispense Refill  . acetaminophen (TYLENOL) 325 MG tablet Take 2 tablets (650 mg total) by mouth every 4 (four) hours as needed  for mild pain, fever or headache (or Fever >/= 101).    . bisacodyl (DULCOLAX) 10 MG suppository Place 1 suppository (10 mg total) rectally as needed for severe constipation. 6 suppository 0  . diazepam (VALIUM) 10 MG tablet Take 0.5-1 tablets (5-10 mg total) by mouth every 8 (eight) hours as needed. 30 tablet 0  . furosemide (LASIX) 20 MG tablet Take 1 tablet (20 mg total) by mouth daily. 30 tablet 0  . HYDROmorphone (DILAUDID) 2 MG tablet Take 0.5 tablets (1 mg total) by mouth every 3  (three) hours as needed for moderate pain or severe pain. 40 tablet 0  . megestrol (MEGACE) 40 MG/ML suspension TAKE 5 MLS BY MOUTH ONCE DAILY  0  . nicotine (NICODERM CQ - DOSED IN MG/24 HOURS) 21 mg/24hr patch Place 1 patch (21 mg total) onto the skin daily. 28 patch 0  . nystatin (MYCOSTATIN) 100000 UNIT/ML suspension Take 5 mLs (500,000 Units total) by mouth 4 (four) times daily. 60 mL 1  . OXYCONTIN 15 MG 12 hr tablet Take 15 mg by mouth every 12 (twelve) hours.  0  . potassium chloride SA (K-DUR,KLOR-CON) 20 MEQ tablet Take 1 tablet (20 mEq total) by mouth daily. 30 tablet 0  . QUEtiapine (SEROQUEL) 25 MG tablet Take 1 tablet (25 mg total) by mouth at bedtime. 15 tablet 0  . senna (SENOKOT) 8.6 MG TABS tablet Take 2 tablets (17.2 mg total) by mouth at bedtime. 30 each 1    Review of Systems: GENERAL:  Fatigue.  No apparent fevers.  Ongoing weight loss. PERFORMANCE STATUS (ECOG):  4 HEENT:  Denies diplopia.  Dry mouth.  No runny nose, sore throat, mouth sores or tenderness. Lungs:  Shortness of breath.  Cough.  No hemoptysis. Cardiac:  Chest/rib pain.  No palpitations, orthopnea, or PND. GI:  Poor appetite.  Minimal oral intake.  No vomiting, diarrhea, melena or hematochezia. GU:  Very thirsty.  No urgency, frequency, dysuria, or hematuria. Musculoskeletal: Diffuse bone pain.  No muscle tenderness. Extremities:  No swelling. Skin:  No rashes or skin changes. Neuro:  Generalized weakness.  No headache, focal numbness or weakness.  Unable to walk. Endocrine:  No diabetes, thyroid issues. Psych:  Anxiety.  Confusion. Pain:  Bone pain, diffuse. Review of systems:  All other systems reviewed and found to be negative.  Physical Exam:  Blood pressure 95/50, pulse 83, temperature 97.4 F (36.3 C), temperature source Oral, resp. rate 19, height 5' 4"  (1.626 m), weight 102 lb 8 oz (46.494 kg), SpO2 100 %.  GENERAL:  Chronically ill appearing thin woman lying in bed on the medical unit in no  acute distress.  She asks for assistance to shift her position in bed. MENTAL STATUS:  Confused about recent events.  Oriented to person and place.  Knows my name. HEAD:  Shoulder length dark hair.  Normocephalic, atraumatic, face symmetric, no Cushingoid features. EYES:  Brown eyes.  No conjunctivitis or scleral icterus. ENT: Dry mouth.  Oropharynx clear without lesion. Tongue normal.  RESPIRATORY: Poor respiratory excursion.  No wheezes. CARDIOVASCULAR: Regular rate and rhythm without murmur, rub or gallop. ABDOMEN: Soft, slightly tender RUQ without guarding or rebound tenderness. No masses. SKIN: No rashes or ulcers. EXTREMITIES: No edema, no skin discoloration or tenderness. No palpable cords. LYMPH NODES: Right posterior neck 2 cm mobile cyst (old). Small left axillary adenopathy.  No palpable cervical, supraclavicular, inguinal adenopathy  NEUROLOGICAL:  Moves all 4 extremities.  Follows commands. PSYCH: Appropriate.  Confused about  recent events.  Results for orders placed or performed during the hospital encounter of 05/14/2015 (from the past 48 hour(s))  Sodium     Status: Abnormal   Collection Time: 05/16/15  2:49 PM  Result Value Ref Range   Sodium 158 (H) 135 - 145 mmol/L  MRSA PCR Screening     Status: Abnormal   Collection Time: 05/17/15  4:30 AM  Result Value Ref Range   MRSA by PCR POSITIVE (A) NEGATIVE    Comment:        The GeneXpert MRSA Assay (FDA approved for NASAL specimens only), is one component of a comprehensive MRSA colonization surveillance program. It is not intended to diagnose MRSA infection nor to guide or monitor treatment for MRSA infections. CRITICAL RESULT CALLED TO, READ BACK BY AND VERIFIED WITH: CALLED TO MELISSA COBB AT 0548 ON 05/17/15 BY VAB   Basic metabolic panel     Status: Abnormal   Collection Time: 05/17/15  4:33 AM  Result Value Ref Range   Sodium 154 (H) 135 - 145 mmol/L   Potassium 3.2 (L) 3.5 - 5.1 mmol/L   Chloride  114 (H) 101 - 111 mmol/L   CO2 33 (H) 22 - 32 mmol/L   Glucose, Bld 144 (H) 65 - 99 mg/dL   BUN 34 (H) 6 - 20 mg/dL   Creatinine, Ser 1.13 (H) 0.44 - 1.00 mg/dL   Calcium 10.1 8.9 - 10.3 mg/dL   GFR calc non Af Amer 50 (L) >60 mL/min   GFR calc Af Amer 58 (L) >60 mL/min    Comment: (NOTE) The eGFR has been calculated using the CKD EPI equation. This calculation has not been validated in all clinical situations. eGFR's persistently <60 mL/min signify possible Chronic Kidney Disease.    Anion gap 7 5 - 15  CBC     Status: Abnormal   Collection Time: 05/17/15  4:33 AM  Result Value Ref Range   WBC 2.7 (L) 3.6 - 11.0 K/uL   RBC 2.42 (L) 3.80 - 5.20 MIL/uL   Hemoglobin 7.2 (L) 12.0 - 16.0 g/dL   HCT 22.3 (L) 35.0 - 47.0 %   MCV 92.1 80.0 - 100.0 fL   MCH 30.0 26.0 - 34.0 pg   MCHC 32.5 32.0 - 36.0 g/dL   RDW 19.9 (H) 11.5 - 14.5 %   Platelets 125 (L) 150 - 440 K/uL  Magnesium     Status: None   Collection Time: 05/17/15  4:33 AM  Result Value Ref Range   Magnesium 2.1 1.7 - 2.4 mg/dL   No results found.  Assessment:  The patient is a 65 y.o. female with progressive metastatic small cell lung cancer. She has a greater than 50 pack year smoking history. She presented with a 28 pound weight loss over 2 months, nausea, constipation, diplopia on lateral gaze, and diffuse bone pain.   Chest CT on 02/12/2015 revealed mixed lucent and sclerotic and expansile lesions in the spine, ribs, and sternum. There was mediastinal lymphadenopathy with bilateral pulmonary nodules (some solid nodules and some patchy ground-glass nodules). There was focal spiculation in the right middle lung. There was bilateral adrenal gland nodules and skin nodules.I  Abdominal/pelvic CT scan on 11/27/2016revealed a 3.7 x 3.2 x 3.7 cm mass in the cecum with associated ileocolic lymphadenopathy (largest 3.8 cm), metastatic disease to adrenals bilaterally, and a suspicious lesion in segment 5 of the liver. There  was widespread osseous metastasis. There were multiple low to intermediate attenuation cystic appearing  lesions in the ovaries. 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 CA125 was 38.3 (0-38.1) on 02/15/2015.  Head CT revealed a small ovoid soft tissue lesion within the medial aspect of the left orbit just superior to the medial rectus muscle. Orbital MRI on 02/15/2015 revealed a 7 x10 x 9 mm lesion on the left superior oblique muscle and multiple focal areas of enhancement (cerebellum, left basal ganglia, thalamus, and anteromedial right frontal lobe) concerning for metastatic disease.  Bone scan on 02/15/2015 revealed abnormal uptake throughout the calvarium, ribs, vertebral bodies, pelvis, bilateral femurs and shoulders consistent with metastatic disease. Plain films reveal no evidence of impending fracture.  Colonoscopy on 02/15/2015 revealed a frond-like/villous partially obstructing non-circumferential mass in the cecum. 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 CEA was 333.8 on 02/14/2015.  CT guided right iliac crest biopsy on 02/19/2015 confirmed metastatic small cell carcinoma of the lung. CK7, TTF1, CD56 were positive. CK20 and CDX-2 were negative.  She completed cranial radiation for CNS metastasis. She has been undecided about treatment in the past.  Chest CT angiogram on 05/14/2015 revealed no evidence of pulmonary embolism, but progressive pulmonary nodularity and mediastinal adenopathy.  There was diffuse bony metastatic disease.    She has progressive disease.  Initial labs revealed dehydration with renal insufficiency and hypercalcemia.  She is malnourished.  She has pancytopenia consistent with marrow replacement.  Performance status is 4.  She was previously on Hospice.  Plan:   1.  Hematology/Oncology:  Patient has declined rapidly in the past month since being seen in clinic.   Performance status is poor.  Patient is a candidate for Hospice given limited life expectancy.  Discuss plan to meet with palliative care and patient's husband.   Thank you for allowing me to participate in ANDROMEDA POPPEN 's care.  I will follow her closely with you while hospitalized and after discharge in the outpatient department.  Lequita Asal, MD  05/17/2015

## 2015-05-18 NOTE — Progress Notes (Addendum)
Palliative Care Update  Palliative Care Consult is initiated.  Hope to arrange a conference with myself and Dr. Mike Gip with pt and her husband.    Pt seems to be actively dying.  Loud wet gurgling bronchial and upper airway sounds noted. She wakens but cannot be understood when she says a few words.   It should be noted that PATIENT HAS NEVER BEEN UNDER THE CARE OF HOSPICE (or [facility]).  The pt and her husband REFUSED on the first visit from the Hospice Nurse back in December.  Equipment had been delivered but was sent right back when they refused Hospice Services.  I have confirmed this with [facility].    I called her husband and left a message.  Apparently he has an appointment in [place] mid day today and will be back here this afternoon (per social work).  Full note to follow.   Colleen Can, MD

## 2015-05-18 NOTE — Progress Notes (Signed)
Slept most of day/  Opens eyes and mumbles at times.  Not eating. Dr Megan Salon spoke with husband this pm.

## 2015-05-19 ENCOUNTER — Ambulatory Visit: Payer: Self-pay

## 2015-05-19 DEATH — deceased

## 2015-05-20 ENCOUNTER — Ambulatory Visit: Payer: Self-pay

## 2015-05-21 ENCOUNTER — Ambulatory Visit: Payer: Self-pay

## 2015-05-24 ENCOUNTER — Ambulatory Visit: Payer: Self-pay

## 2015-05-25 ENCOUNTER — Ambulatory Visit: Payer: Self-pay

## 2015-05-26 ENCOUNTER — Ambulatory Visit: Payer: Self-pay

## 2015-05-27 ENCOUNTER — Ambulatory Visit: Payer: Self-pay

## 2015-05-28 ENCOUNTER — Ambulatory Visit: Payer: Self-pay

## 2015-05-28 ENCOUNTER — Ambulatory Visit: Payer: Self-pay | Admitting: Hematology and Oncology

## 2015-05-31 ENCOUNTER — Ambulatory Visit: Payer: Self-pay

## 2015-06-01 ENCOUNTER — Ambulatory Visit: Payer: Self-pay

## 2015-06-02 ENCOUNTER — Ambulatory Visit: Payer: Self-pay

## 2015-06-03 ENCOUNTER — Ambulatory Visit: Payer: Self-pay

## 2015-06-04 ENCOUNTER — Ambulatory Visit: Payer: Self-pay

## 2015-06-19 NOTE — Discharge Summary (Signed)
Date of death- 05-29-2015  Cause of death    Metastatic small cell lung cancer  Other contributing factors   Dehydration  Hospital course and stay      Patient came with severe dehydration and altered mental status, hypernatremia- gradually worsening in the hospital and family agreed for comfort care. She died in hospital.  For further details please see history and physical and progress notes done earlier.

## 2015-06-19 NOTE — Consult Note (Signed)
Palliative Medicine Inpatient Consult Follow Up Note   Name: Brandy Odom Date: Jun 01, 2015 MRN: [id number]  DOB: May 10, 1950  Referring Physician: Vaughan Basta, MD  Palliative Care consult requested for this 65 y.o. Brandy for goals of medical therapy in patient with end of life stage 4 lung cancer.  PLAN: Death appears imminent. She appears to be comfortable. Continue with reasonable rate morphine and other symptom meds and terminal comfort care.  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) - and diffuse bone mets and mediastinal and pulmonary mets Dehydration from poor oral intake Carcinoid Hypernatremia Anemia of chronic disease Pancytopenia Anxiety Depression Bone pain and other pain associated with cancer lesions and bedbound status Loss of appetite Severe Malnutrition in the context of terminal cancer Hypotension --multifactorial Poor secretion management Confusion and altered level of awareness --multifactorial Limited mobility Dysphagia (she had a mild dysphagia in December --now it is severe) Tobacco smoker    REVIEW OF SYSTEMS:  Patient is not able to provide ROS --obtunded  CODE STATUS: DNR   PAST MEDICAL HISTORY: Past Medical History  Diagnosis Date  . Tobacco abuse   . Muscle spasm   . Small cell lung cancer ([place])     Metastatic  . Lung cancer, primary, with metastasis from lung to other site [facility])     PAST SURGICAL HISTORY:  Past Surgical History  Procedure Laterality Date  . Appendectomy    . Colonoscopy with propofol N/A 02/15/2015    Procedure: COLONOSCOPY WITH PROPOFOL;  Surgeon: Lucilla Lame, MD;  Location: [facility] ENDOSCOPY;  Service: Endoscopy;  Laterality: N/A;  . Portacath placement N/A 03/03/2015    Procedure: INSERTION PORT-A-CATH;  Surgeon: Florene Glen, MD;  Location: [facility] ORS;  Service: General;  Laterality: N/A;    Vital Signs: BP 72/47 mmHg  Pulse 123  Temp(Src)  99.2 F (37.3 C) (Oral)  Resp 17  Ht '5\' 4"'$  (1.626 m)  Wt 46.494 kg (102 lb 8 oz)  BMI 17.59 kg/m2  SpO2 95% Filed Weights   05/13/2015 1620  Weight: 46.494 kg (102 lb 8 oz)    Estimated body mass index is 17.59 kg/(m^2) as calculated from the following:   Height as of this encounter: '5\' 4"'$  (1.626 m).   Weight as of this encounter: 46.494 kg (102 lb 8 oz).  PHYSICAL EXAM: Pt is obtunded --nonrespnsive to sternal rub Respirations are even but labored and deep and there are loud bronchial rattling sounds  She is gray Hrt rrr distant tachy at 120 Skin cool and dry   LABS: CBC:    Component Value Date/Time   WBC 2.7* 05/17/2015 0433   WBC 9.0 04/10/2012 1601   HGB 7.2* 05/17/2015 0433   HGB 16.0 04/10/2012 1601   HCT 22.3* 05/17/2015 0433   HCT 46.4 04/10/2012 1601   PLT 125* 05/17/2015 0433   PLT 222 04/10/2012 1601   MCV 92.1 05/17/2015 0433   MCV 85 04/10/2012 1601   NEUTROABS 3.8 04/28/2015 1625   LYMPHSABS 1.0 04/23/2015 1625   MONOABS 0.5 05/07/2015 1625   EOSABS 0.0 05/01/2015 1625   BASOSABS 0.0 05/17/2015 1625   Comprehensive Metabolic Panel:    Component Value Date/Time   NA 151* 05/18/2015 0534   NA 138 04/10/2012 1601   K 4.5 05/18/2015 0534   K 3.9 04/10/2012 1601   CL 116* 05/18/2015 0534   CL 106 04/10/2012 1601   CO2 28 05/18/2015 0534   CO2 27 04/10/2012 1601  BUN 23* 05/18/2015 0534   BUN 12 04/10/2012 1601   CREATININE 0.96 05/18/2015 0534   CREATININE 0.89 04/10/2012 1601   GLUCOSE 122* 05/18/2015 0534   GLUCOSE 97 04/10/2012 1601   CALCIUM 10.4* 05/18/2015 0534   CALCIUM 9.7 04/10/2012 1601   AST 56* 05/13/2015 1625   ALT 16 05/05/2015 1625   ALKPHOS 126 05/09/2015 1625   BILITOT 2.0* 05/18/2015 1625   PROT 6.3* 04/23/2015 1625   ALBUMIN 2.7* 05/11/2015 1625      Time Spent:  15 min

## 2015-06-19 NOTE — Progress Notes (Signed)
Death Pronouncement  Pt was found to be without respirations, pulses, breath sounds, lung sounds, and her pupils were fixed and dilated.  I personally confirmed all these physical findings.   Death is pronounced at 9:50 am.  Husband has left the room 'for some air'.    I will update her attending physician.  Colleen Can, MD

## 2015-06-19 NOTE — Progress Notes (Signed)
[facility] NAME: Brandy Odom    MR#:  [id number]  DATE OF BIRTH:  08-03-1950  SUBJECTIVE:    Patient is confused Has gurgling sounds.  Drowsy due to morphin drip, and have gasping type breathing.  REVIEW OF SYSTEMS:    Review of Systems  Unable to perform ROS  due to encephalopathy  DRUG ALLERGIES:   Allergies  Allergen Reactions  . Bee Venom Anaphylaxis  . Naproxen Nausea And Vomiting  . Sulfa Antibiotics Other (See Comments)    Reaction:  Unknown   . Antihistamines, Chlorpheniramine-Type Palpitations    VITALS:  Blood pressure 72/47, pulse 123, temperature 99.2 F (37.3 C), temperature source Oral, resp. rate 17, height '5\' 4"'$  (1.626 m), weight 46.494 kg (102 lb 8 oz), SpO2 95 %.  PHYSICAL EXAMINATION:   Physical Exam  Constitutional: No distress.  Frail appearing and lethargic  HENT:  Head: Normocephalic.  Eyes: No scleral icterus.  Neck: Neck supple. No JVD present. No tracheal deviation present. No thyromegaly present.  Cardiovascular: Regular rhythm and normal heart sounds.  Exam reveals no gallop and no friction rub.   No murmur heard. Tachycardic  Pulmonary/Chest: She is in respiratory distress. She has no wheezes. She has rales. She exhibits no tenderness.  Coarse breath sounds, rapid shallow breaths,.  Abdominal: Soft. Bowel sounds are normal. She exhibits no distension and no mass. There is no tenderness. There is no rebound and no guarding.  Musculoskeletal: She exhibits no edema.  Generalized weakness  Neurological:  Drowzy  Skin: Skin is warm. No rash noted. No erythema.      LABORATORY PANEL:   CBC  Recent Labs Lab 05/17/15 0433  WBC 2.7*  HGB 7.2*  HCT 22.3*  PLT 125*   ------------------------------------------------------------------------------------------------------------------  Chemistries   Recent Labs Lab 04/27/2015 1625  05/17/15 0433 05/18/15 0534  NA 158*   < > 154* 151*  K 3.3*  < > 3.2* 4.5  CL 115*  < > 114* 116*  CO2 32  < > 33* 28  GLUCOSE 177*  < > 144* 122*  BUN 50*  < > 34* 23*  CREATININE 1.58*  < > 1.13* 0.96  CALCIUM 10.8*  < > 10.1 10.4*  MG  --   --  2.1  --   AST 56*  --   --   --   ALT 16  --   --   --   ALKPHOS 126  --   --   --   BILITOT 2.0*  --   --   --   < > = values in this interval not displayed. ------------------------------------------------------------------------------------------------------------------  Cardiac Enzymes  Recent Labs Lab 05/17/2015 1625  TROPONINI 0.31*   ------------------------------------------------------------------------------------------------------------------  RADIOLOGY:  No results found.   ASSESSMENT AND PLAN:   65 year old female with a history of small cell lung cancer with metastatic disease who presents with encephalopathy And generalized weakness.  * Acute respiratory failure with worsening hypoxia CT chest negative for PE but does show worsening metastatic disease which is likely etiology for respiratory failure with hypoxia. Patient has difficult time clearing her secretions which is contributing to this significantly.   On morphin drip now for comfort.  * Severe hypernatremia due to dehydration and being on Lasix with poor oral intake on D5 at 100 ML per hour. Very slow improvement. Patient is still confused. Unstable.  * Acute encephalopathy - no improvement Likely due to hypernatremia and worsening  brain metastasis.  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  * Acute renal failure: Due to dehydration and poor by mouth intake  improving  * Anemia of chronic disease: Transfuse hemoglobin if less than 7.  Conference with husband including oncology and palliative care done- he agreed on comfort care  Poor  prognosis. Patient seems to have declined significantly in the last 1 month as per oncology note. Presentlyshe seems hospice home appropriate.  Discussed with Dr. Megan Salon of palliative care.  CODE STATUS: DNR  TOTAL TIME TAKING CARE OF THIS PATIENT: 35 minutes.    Vaughan Basta M.D on 06-16-2015 at 10:09 AM  Between 7am to 6pm - Pager - [phone] After 6pm go to [email or web address] - password [email or web address] [facility]  [facility]  Office  [phone]  CC: Primary care physician; No PCP Per Patient  Note: This dictation was prepared with Dragon dictation along with smaller phrase technology. Any transcriptional errors that result from this process are unintentional.

## 2015-06-19 NOTE — Progress Notes (Signed)
Patient has been rattling since 12 MN.  Warm and dry to touch. No signs and symptoms of pain or any acute respiratory distress noted. Husband has been at the bedside the whole of this shift, voiced no complaint. Will continue to monitor.

## 2015-06-19 NOTE — Progress Notes (Signed)
   2015/06/14 1030  Clinical Encounter Type  Visited With Patient and family together  Visit Type Death  Referral From Nurse  Consult/Referral To Chaplain  Spiritual Encounters  Spiritual Needs Ritual;Prayer;Grief support  Stress Factors  Patient Stress Factors (Death)  Family Stress Factors Loss  Provide extreme unction sacrament for Vici. Prayed with husband and provided grief support. Provided information to family for process of notifying funeral homes for arrangements. Chap. Eashan Schipani G. [place]

## 2015-06-19 DEATH — deceased

## 2015-09-06 ENCOUNTER — Other Ambulatory Visit: Payer: Self-pay | Admitting: Nurse Practitioner

## 2017-01-29 IMAGING — CR DG CERVICAL SPINE 2 OR 3 VIEWS
1 series · 4 of 4 positions shown · non-contrast
Comparison: None.

CLINICAL DATA: Neck pain, left arm pain and numbness for about 2
weeks

EXAM:
CERVICAL SPINE - 2-3 VIEW

[Series 1: dg cervical spine 2 or 3 views · 0.14mm/px · 4 of 4 slices shown]
[im 1/4]
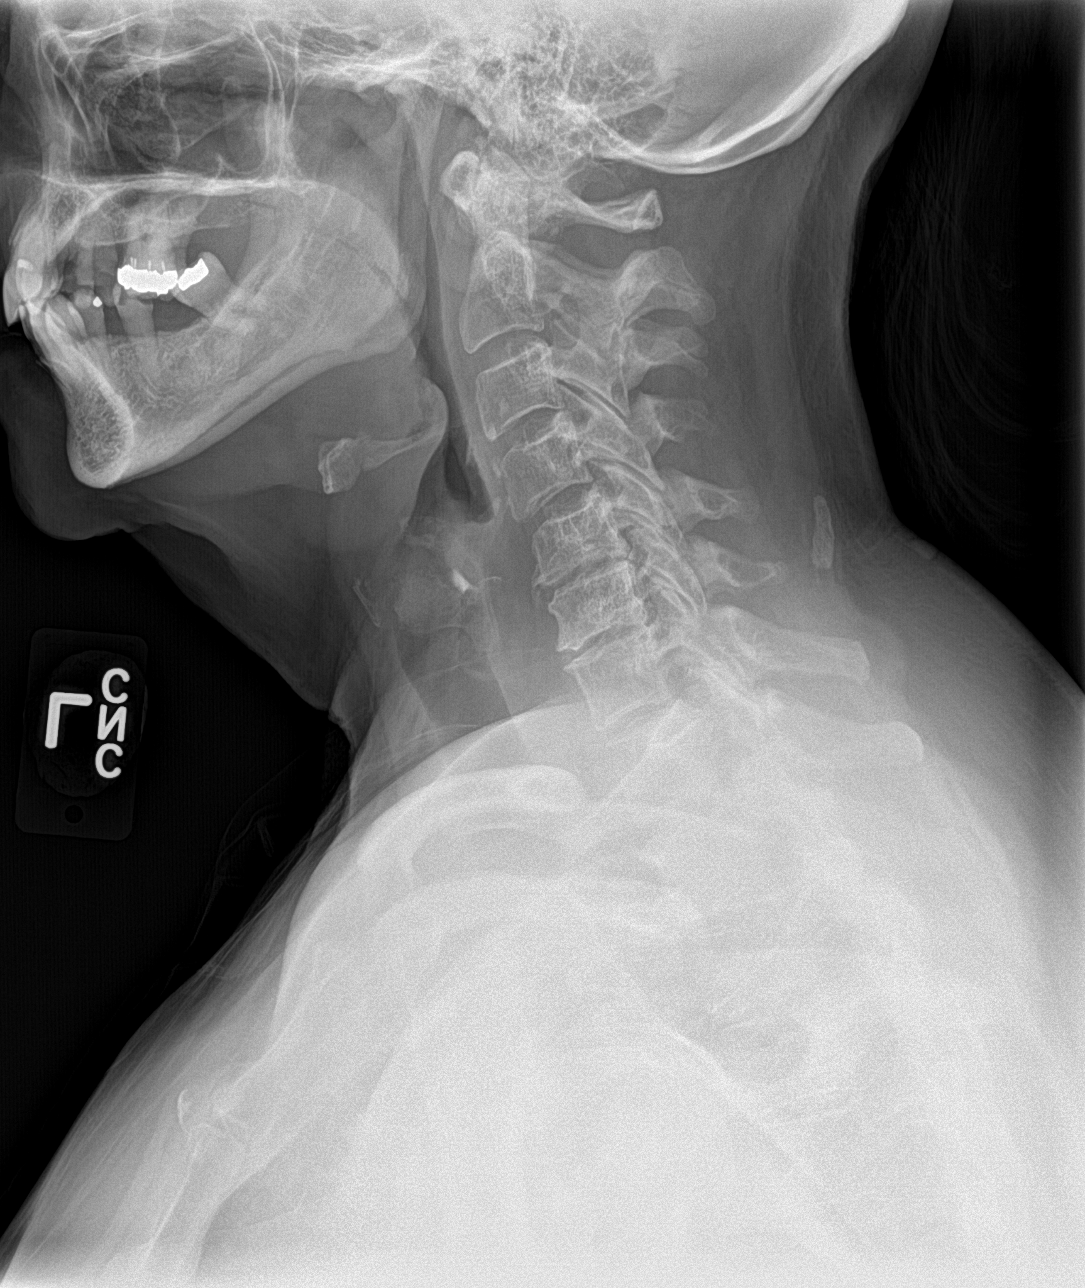
[im 2/4]
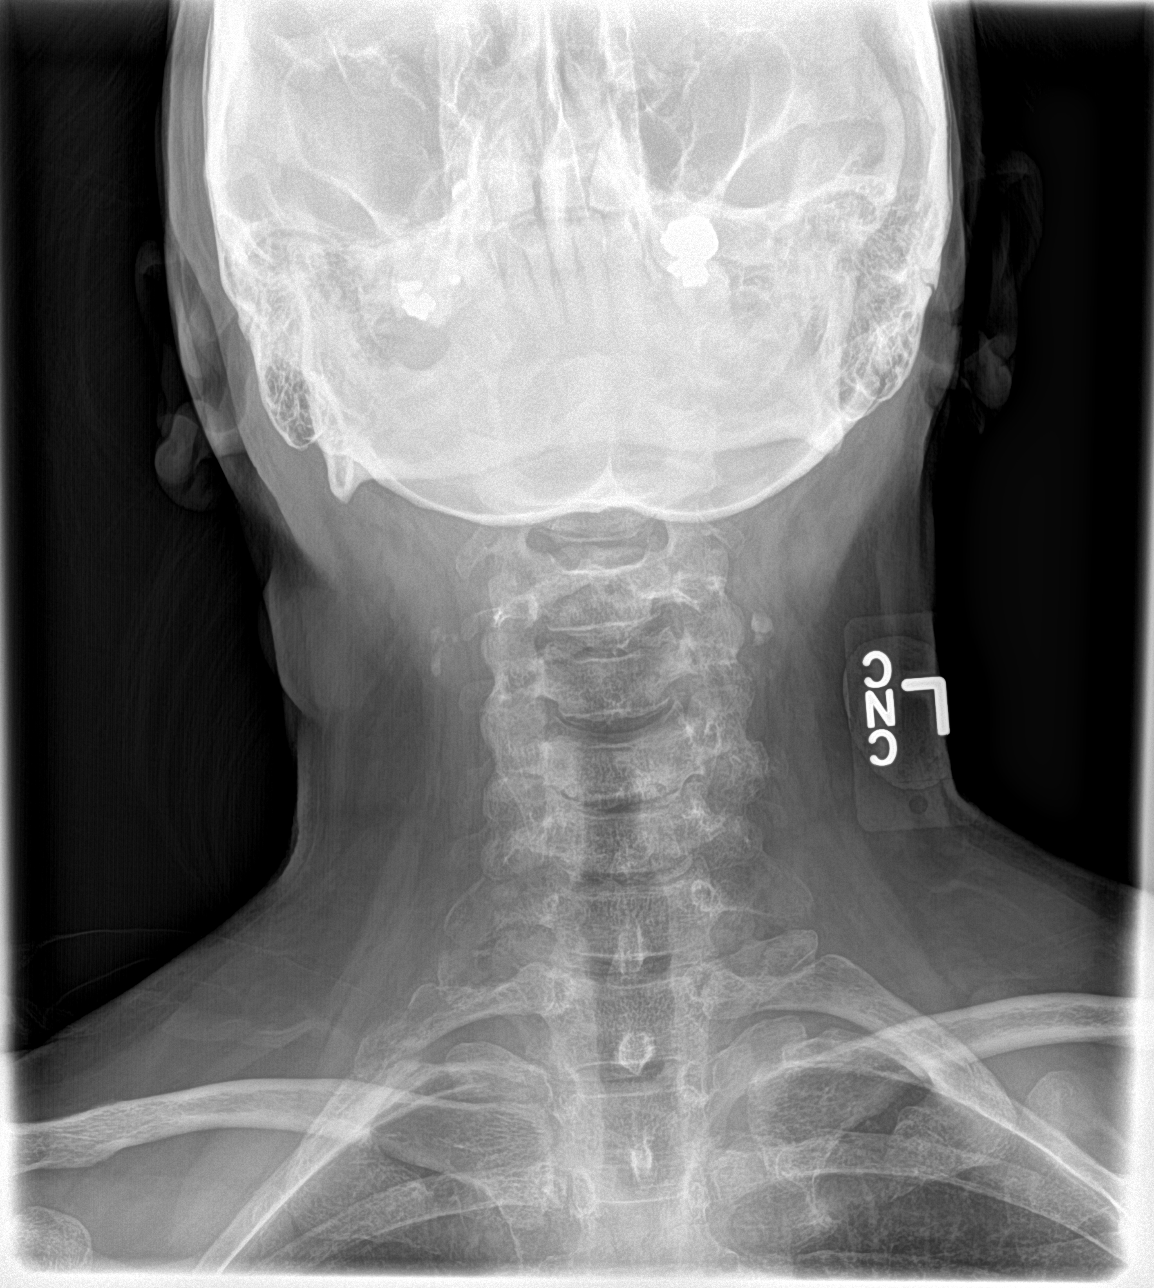
[im 3/4]
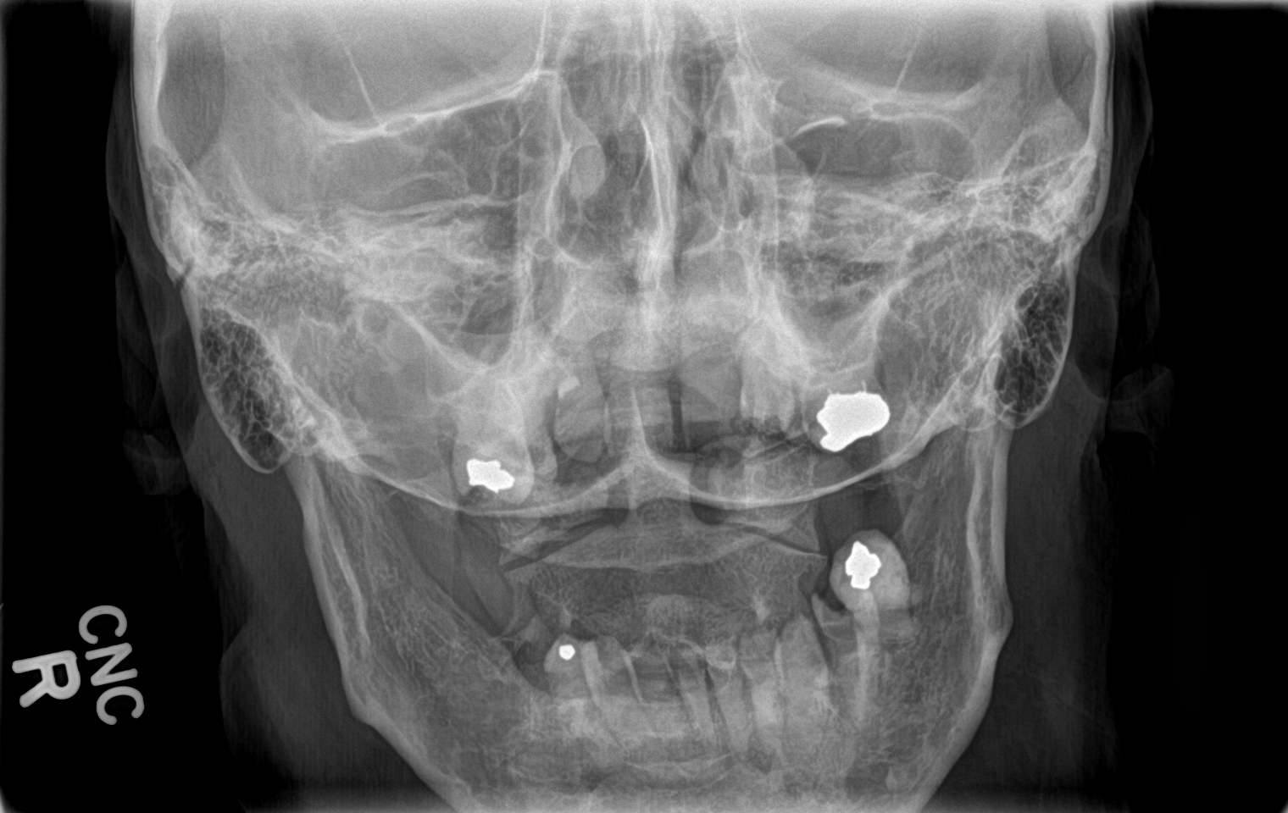
[im 4/4]
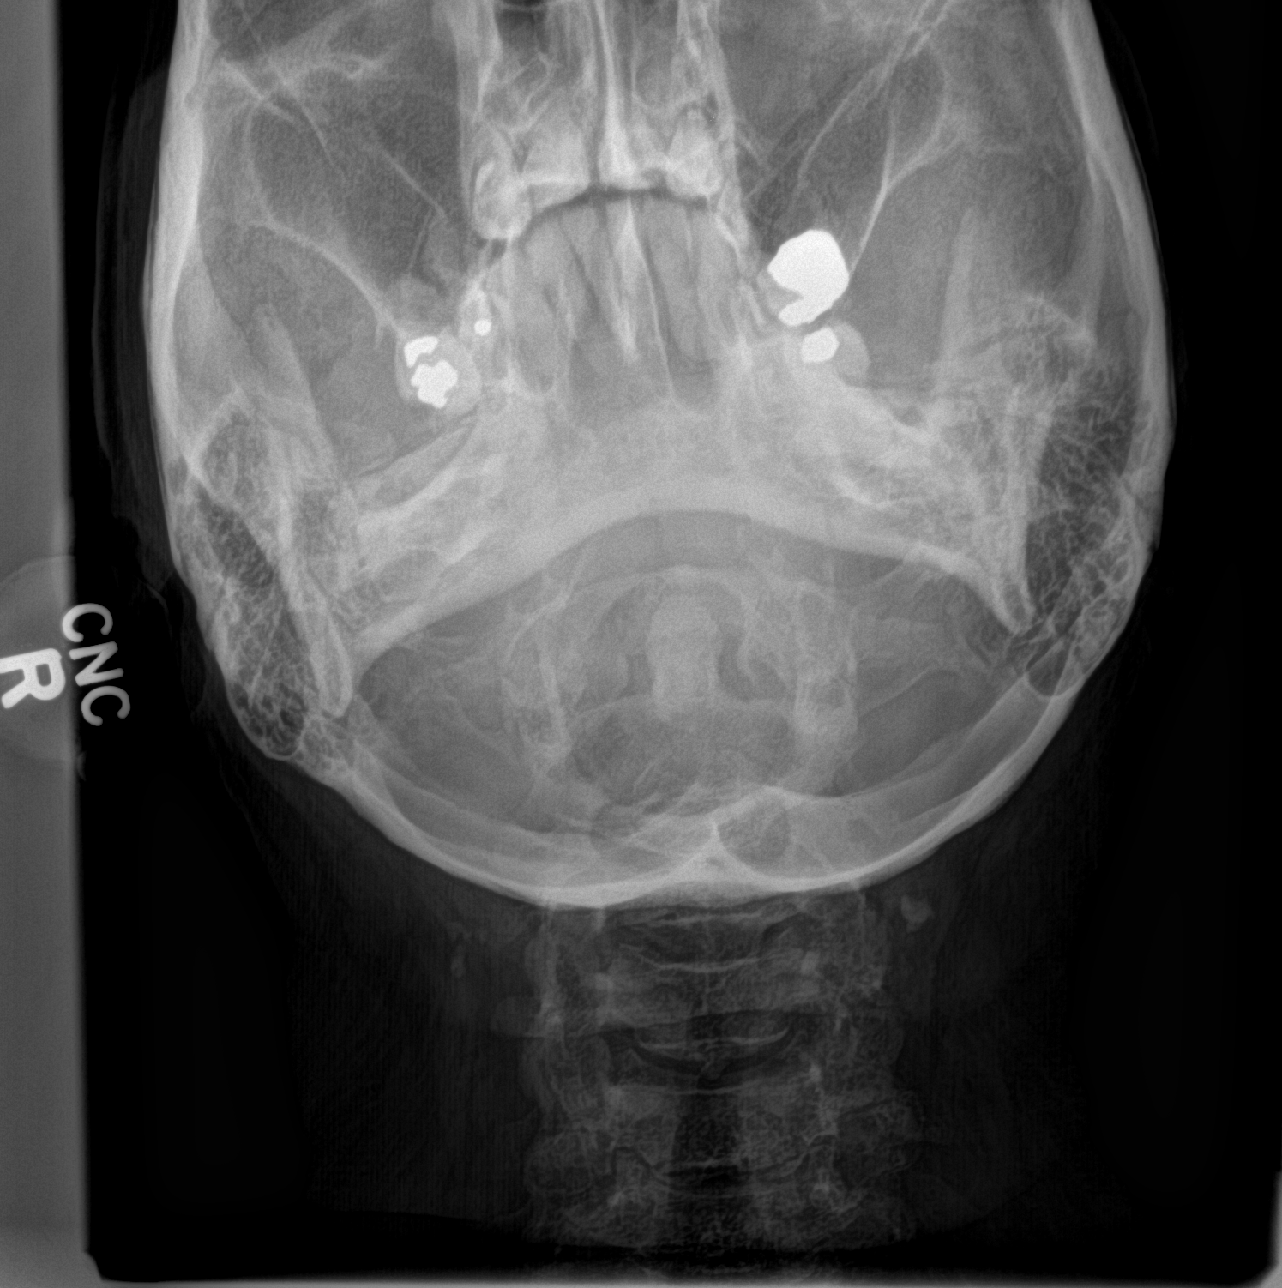

[4 of 4 positions shown; findings below may reference images not displayed]

FINDINGS: Four views of cervical spine submitted. No acute fracture or
subluxation. Mild degenerative changes C1-C2 articulation. There is
disc space flattening with mild anterior spurring at C5-C6 and C6-C7
level. No prevertebral soft tissue swelling. Cervical airway is
patent.
IMPRESSION: No acute fracture or subluxation. Degenerative changes as described
above.

## 2017-02-03 IMAGING — CR DG CHEST 2V
2 series · 2 of 2 positions shown · non-contrast
Comparison: Chest radiograph performed 09/06/2006

CLINICAL DATA: Chronic neck and arm pain. Cough and anterior chest
pain. Initial encounter.

EXAM:
CHEST  2 VIEW

[chest pa]
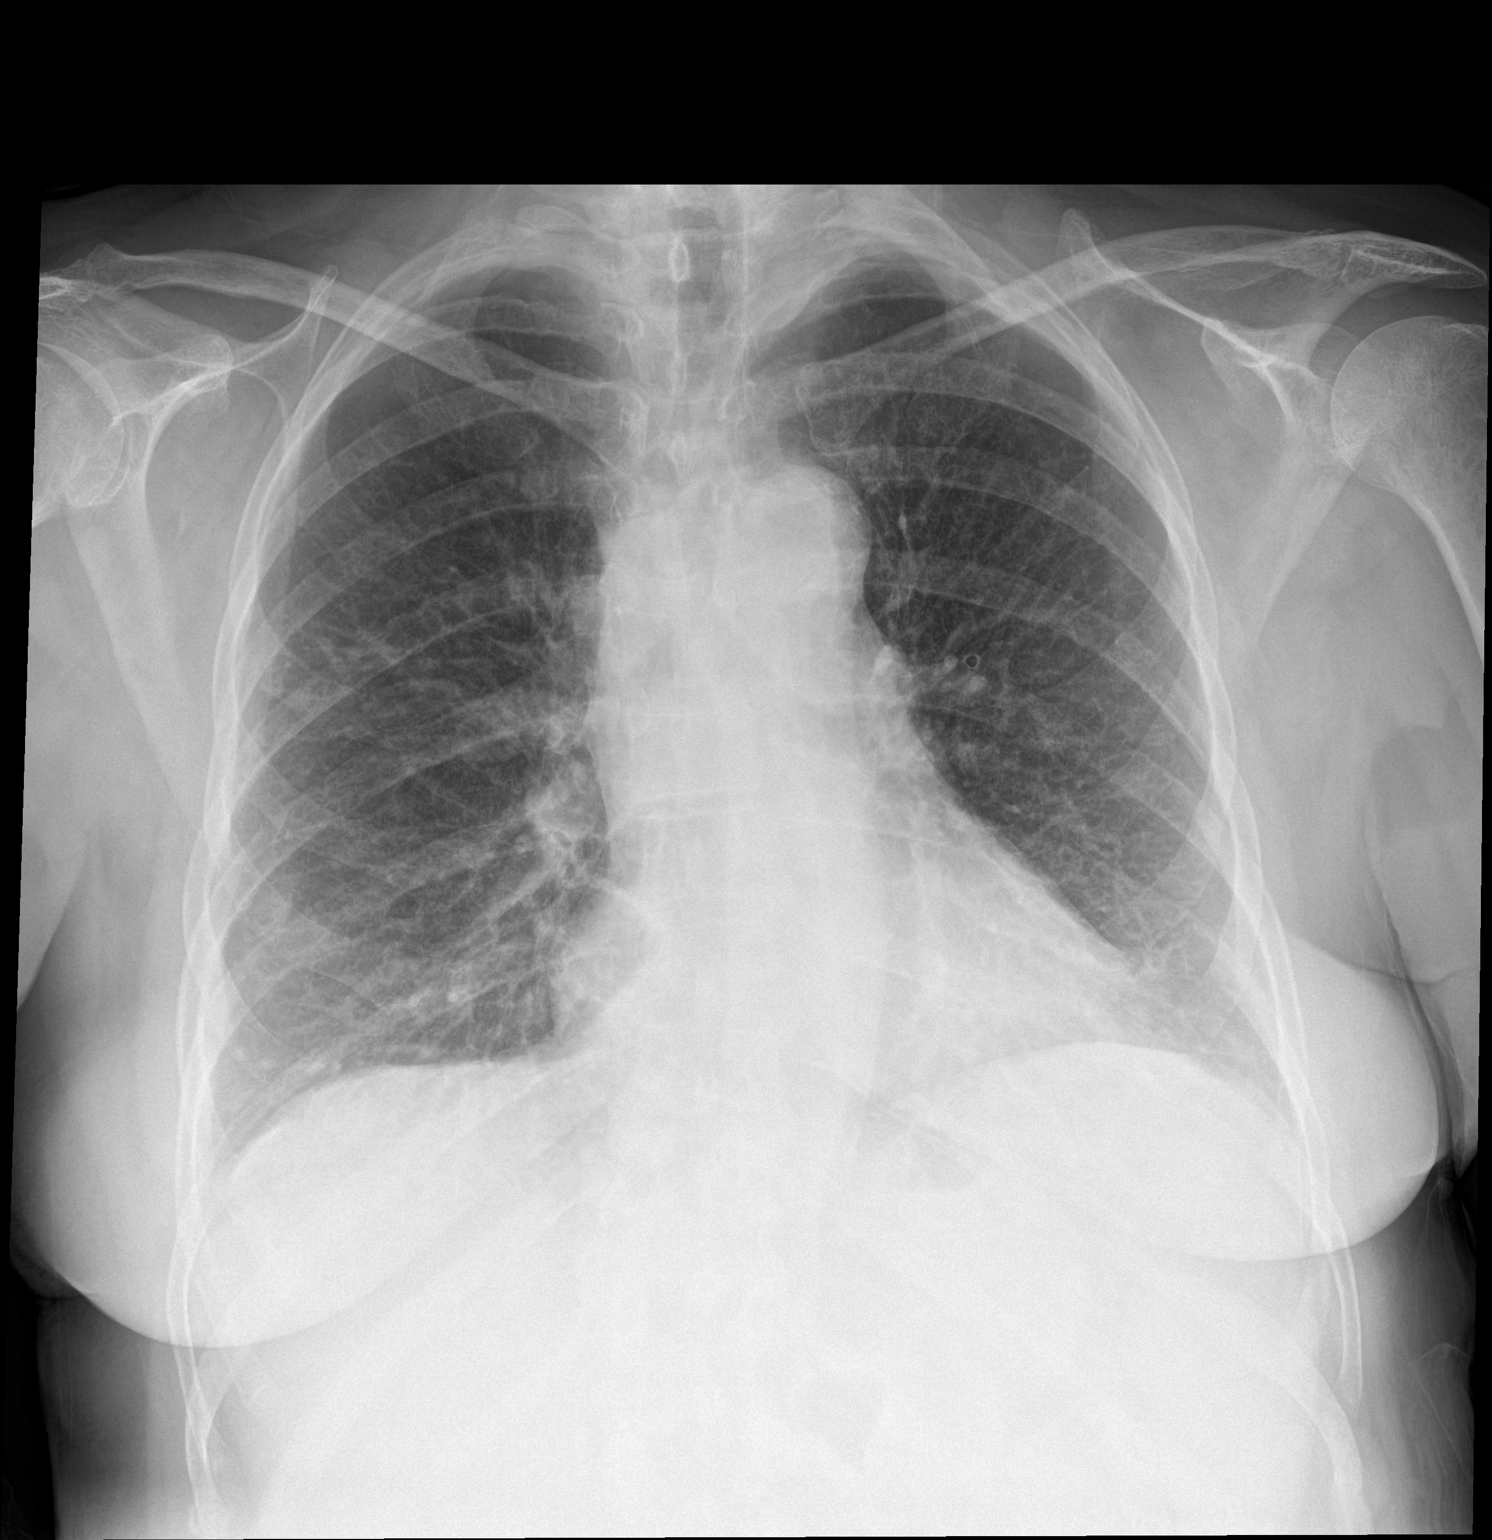

[chest lat]
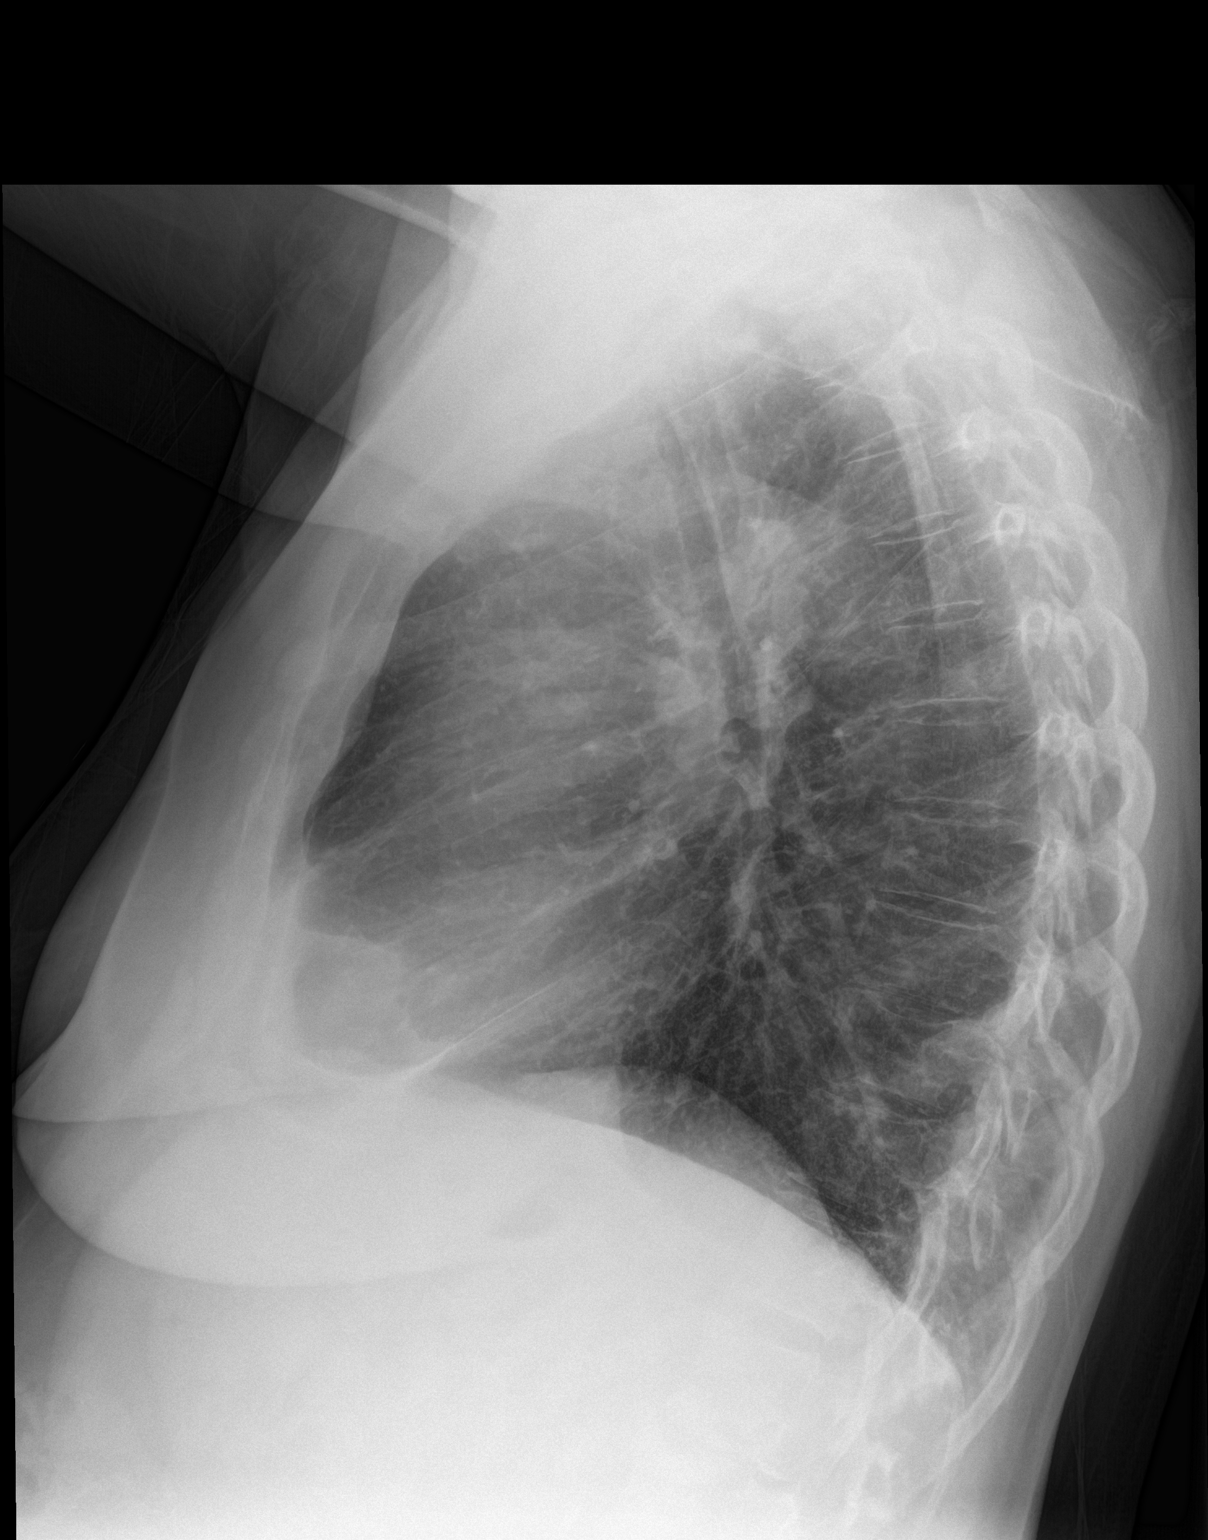

[2 of 2 positions shown; findings below may reference images not displayed]

FINDINGS: The lungs are well-aerated. Minimal nodular opacities are noted in
the periphery of the right lung. There is no evidence of pleural
effusion or pneumothorax.

The heart is borderline normal in size. No acute osseous
abnormalities are seen.
IMPRESSION: Minimal nodular opacities in the periphery of the right lung raise
question for a mild infectious process.

## 2017-02-13 IMAGING — CT CT BIOPSY
1 of 2 series · 14 of 32 positions shown, 19 images · non-contrast
Comparison: none

CLINICAL DATA: Multiple sclerotic bone metastases, no known primary

[Series 2: routine abdomen · axial · 0.60mm/px · z∈[+493,+574]mm · 14 of 31 slices shown, 19 images]
[im 2/31  soft-tissue]
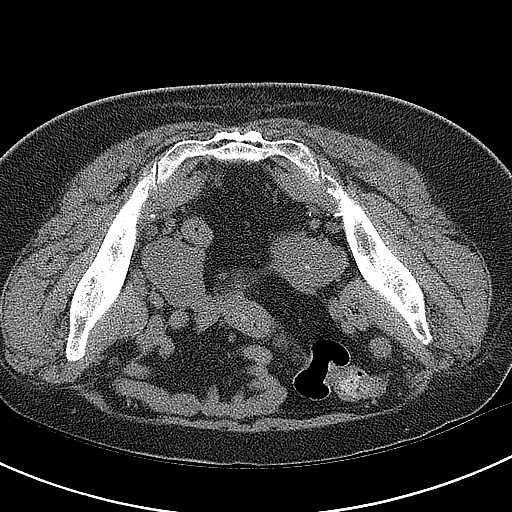
[im 2/31  bone]
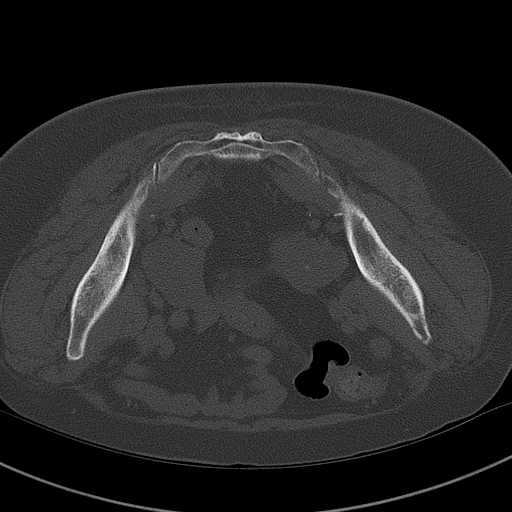
[im 4/31  soft-tissue]
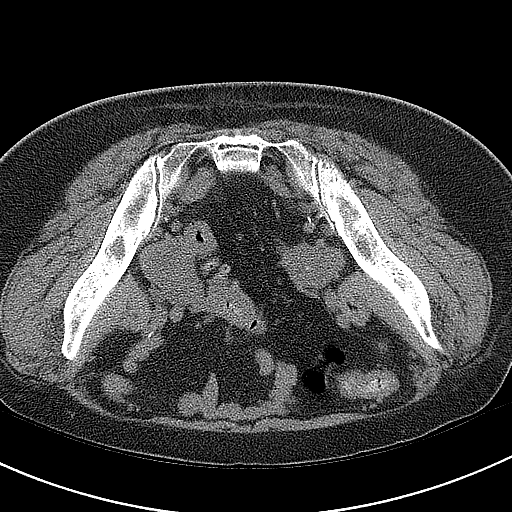
[im 8/31  soft-tissue]
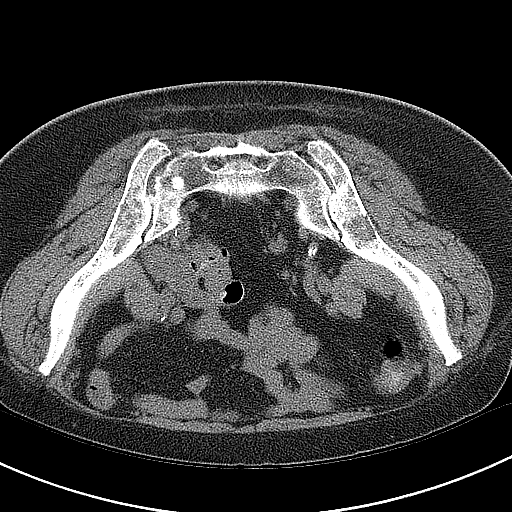
[im 9/31  soft-tissue]
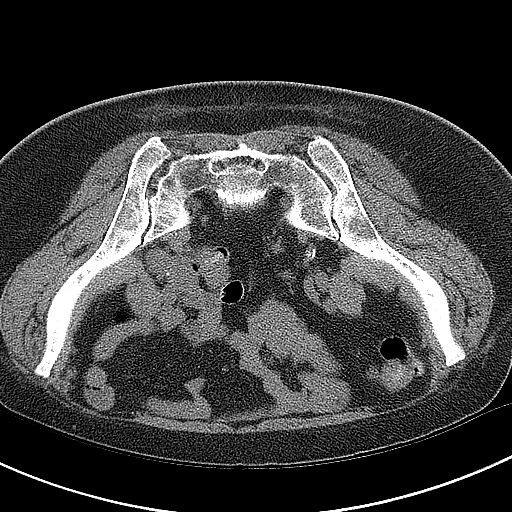
[im 11/31  soft-tissue]
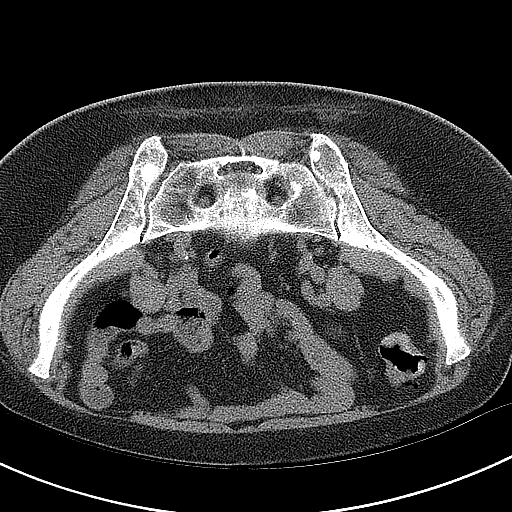
[im 13/31  soft-tissue]
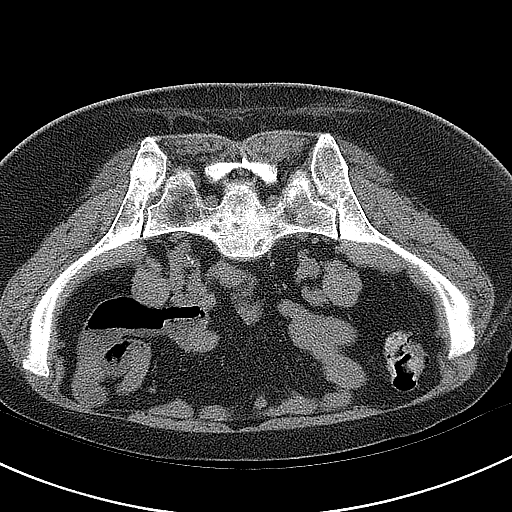
[im 16/31  soft-tissue]
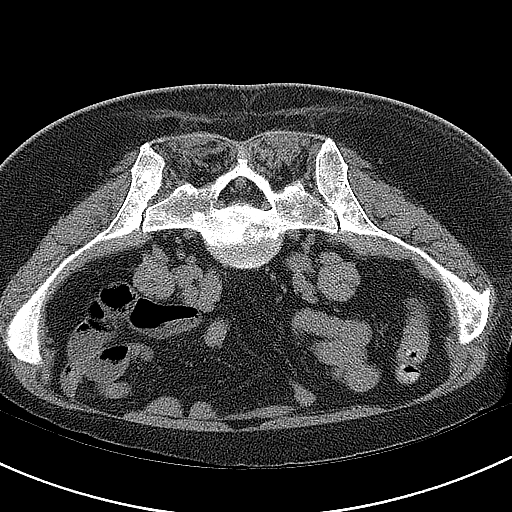
[im 18/31  soft-tissue]
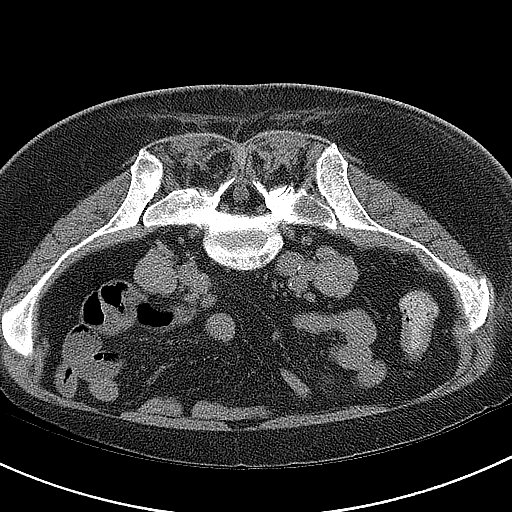
[im 20/31  soft-tissue]
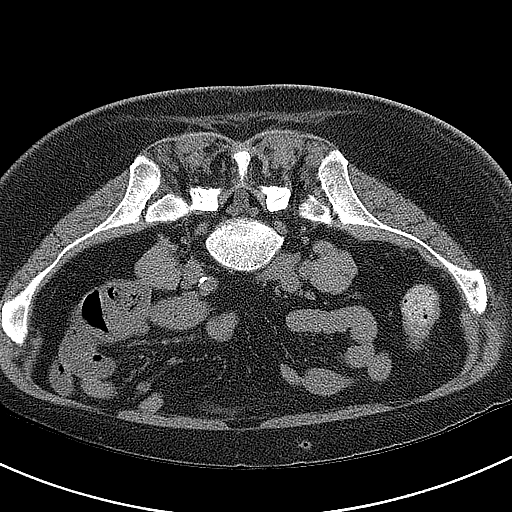
[im 20/31  bone]
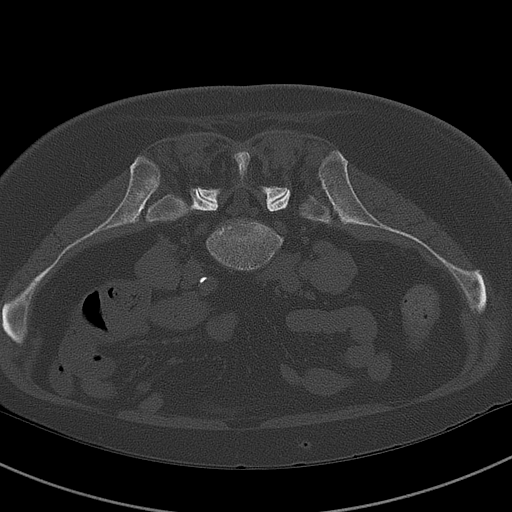
[im 22/31  soft-tissue]
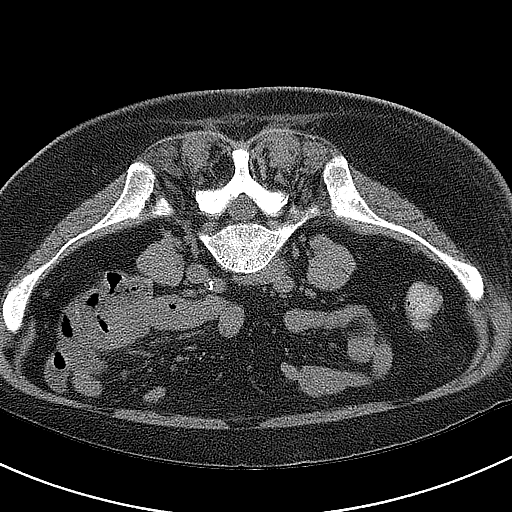
[im 23/31  soft-tissue]
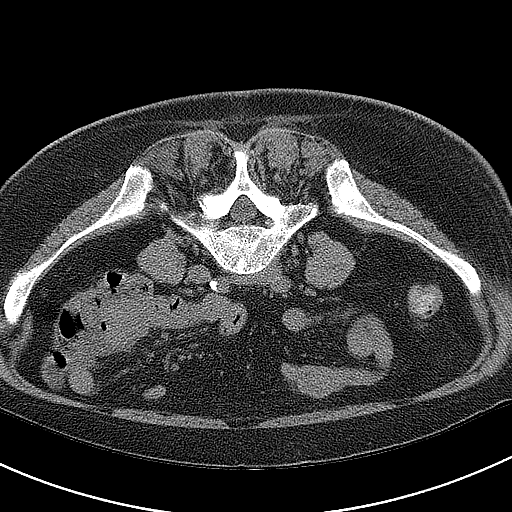
[im 23/31  lung]
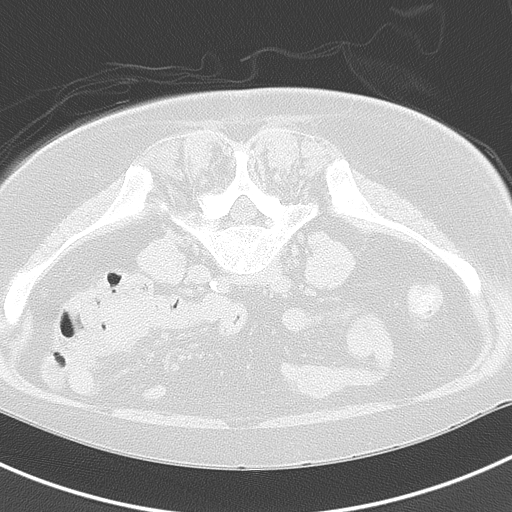
[im 25/31  lung]
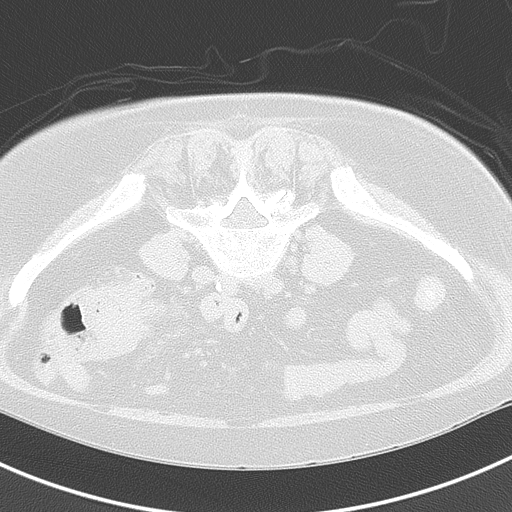
[im 27/31  soft-tissue]
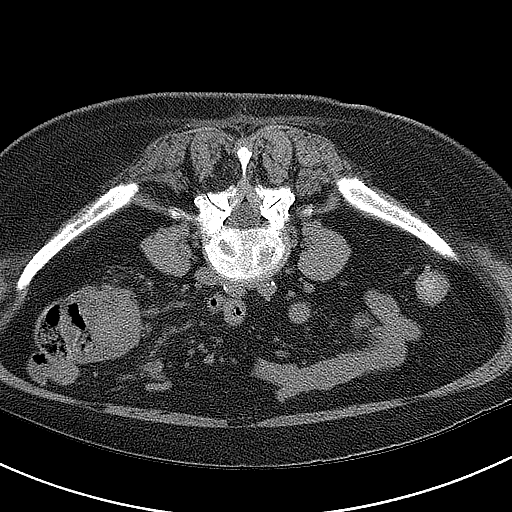
[im 27/31  lung]
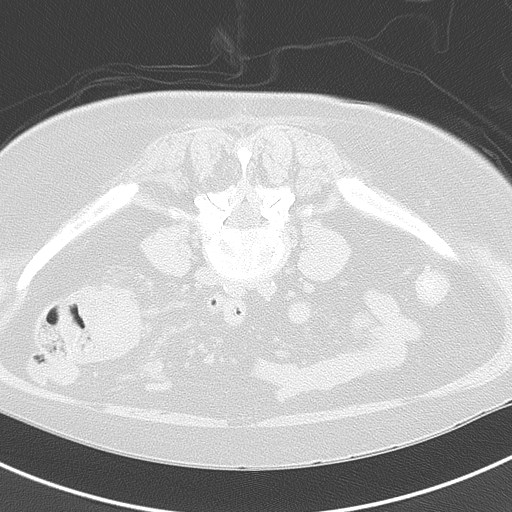
[im 29/31  soft-tissue]
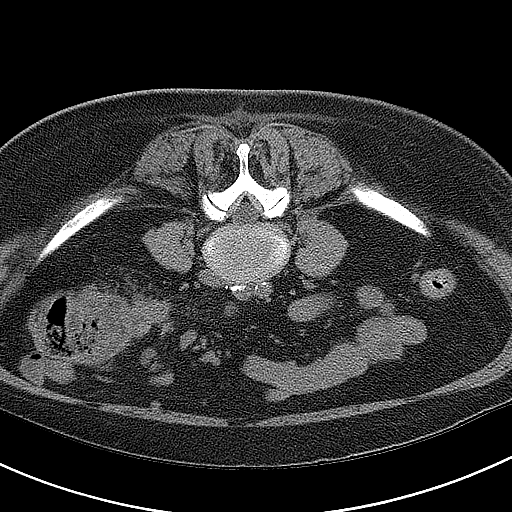
[im 29/31  lung]
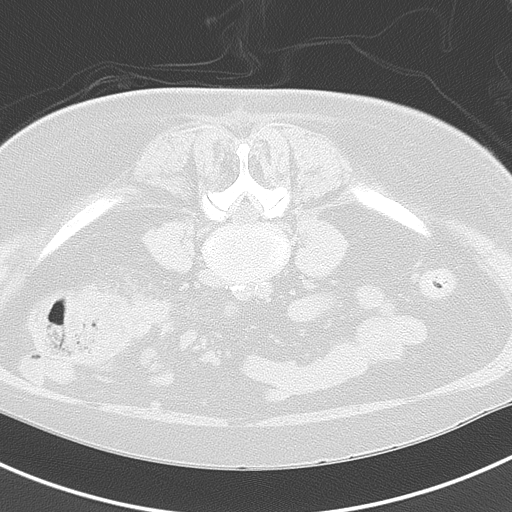

[14 of 32 positions shown; findings below may reference images not displayed]

EXAM:
CT-GUIDED BIOPSY RIGHT ILIAC CREST SCLEROTIC BONE LESION

MEDICATIONS AND MEDICAL HISTORY:
Versed 1.0 mg, Fentanyl 50 mcg.

Additional Medications: None..

ANESTHESIA/SEDATION:
Moderate sedation time: 15 minutes

PROCEDURE:
The procedure, risks, benefits, and alternatives were explained to
the patient. Questions regarding the procedure were encouraged and
answered. The patient understands and consents to the procedure.

The right posterior iliac area was prepped with chlorhexidine in a
sterile fashion, and a sterile drape was applied covering the
operative field. A sterile gown and sterile gloves were used for the
procedure.

Under CT guidance, a(n) 11 gauge guide needle was advanced into the
right iliac crest sclerotic bone lesion. Needle position confirmed
with CT. 11 gauge drill core biopsy obtained. Sample placed on a
saline soaked Telfa. Postprocedure imaging demonstrates core biopsy
sample defect through the sclerotic lesion. No complication.

Patient tolerated the procedure well without complication. Vital
sign monitoring by nursing staff during the procedure will continue
as patient is in the special procedures unit for post procedure
observation.
FINDINGS: The images document guide needle placement within the right iliac
crest sclerotic bone lesion.. Post biopsy images demonstrate no
hemorrhage or hematoma..

COMPLICATIONS:
None immediate
IMPRESSION: Successful CT-guided core biopsy of the right iliac crest sclerotic
bone lesion.
